# Patient Record
Sex: Female | Born: 1960
Health system: Southern US, Community
[De-identification: ages and names within clinical notes are randomized; demographics above are authoritative.]

## PROBLEM LIST (undated history)

## (undated) DIAGNOSIS — I10 Essential (primary) hypertension: Secondary | ICD-10-CM

## (undated) DIAGNOSIS — R519 Headache, unspecified: Secondary | ICD-10-CM

## (undated) DIAGNOSIS — F419 Anxiety disorder, unspecified: Secondary | ICD-10-CM

## (undated) DIAGNOSIS — E119 Type 2 diabetes mellitus without complications: Secondary | ICD-10-CM

## (undated) DIAGNOSIS — C9200 Acute myeloblastic leukemia, not having achieved remission: Secondary | ICD-10-CM

## (undated) DIAGNOSIS — D649 Anemia, unspecified: Secondary | ICD-10-CM

## (undated) DIAGNOSIS — M797 Fibromyalgia: Secondary | ICD-10-CM

## (undated) DIAGNOSIS — K219 Gastro-esophageal reflux disease without esophagitis: Secondary | ICD-10-CM

## (undated) DIAGNOSIS — Z86718 Personal history of other venous thrombosis and embolism: Secondary | ICD-10-CM

## (undated) DIAGNOSIS — F32A Depression, unspecified: Secondary | ICD-10-CM

## (undated) DIAGNOSIS — J189 Pneumonia, unspecified organism: Secondary | ICD-10-CM

## (undated) DIAGNOSIS — Z87442 Personal history of urinary calculi: Secondary | ICD-10-CM

## (undated) HISTORY — PX: OTHER SURGICAL HISTORY: SHX169

## (undated) HISTORY — PX: CHOLECYSTECTOMY: SHX55

## (undated) HISTORY — PX: ABDOMINAL HYSTERECTOMY: SHX81

## (undated) HISTORY — PX: LITHOTRIPSY: SUR834

---

## 1999-12-01 ENCOUNTER — Ambulatory Visit (HOSPITAL_COMMUNITY): Admission: RE | Admit: 1999-12-01 | Discharge: 1999-12-01 | Payer: Self-pay | Admitting: Obstetrics and Gynecology

## 1999-12-01 ENCOUNTER — Encounter: Payer: Self-pay | Admitting: Obstetrics and Gynecology

## 2000-01-04 ENCOUNTER — Other Ambulatory Visit: Admission: RE | Admit: 2000-01-04 | Discharge: 2000-01-04 | Payer: Self-pay | Admitting: Obstetrics and Gynecology

## 2000-03-21 ENCOUNTER — Ambulatory Visit (HOSPITAL_COMMUNITY): Admission: RE | Admit: 2000-03-21 | Discharge: 2000-03-21 | Payer: Self-pay | Admitting: Obstetrics and Gynecology

## 2000-04-14 ENCOUNTER — Inpatient Hospital Stay (HOSPITAL_COMMUNITY): Admission: AD | Admit: 2000-04-14 | Discharge: 2000-04-14 | Payer: Self-pay | Admitting: Obstetrics and Gynecology

## 2000-04-25 ENCOUNTER — Encounter (INDEPENDENT_AMBULATORY_CARE_PROVIDER_SITE_OTHER): Payer: Self-pay

## 2000-04-25 ENCOUNTER — Inpatient Hospital Stay (HOSPITAL_COMMUNITY): Admission: AD | Admit: 2000-04-25 | Discharge: 2000-04-29 | Payer: Self-pay | Admitting: Obstetrics and Gynecology

## 2001-05-28 ENCOUNTER — Encounter: Payer: Self-pay | Admitting: Emergency Medicine

## 2001-05-28 ENCOUNTER — Emergency Department (HOSPITAL_COMMUNITY): Admission: EM | Admit: 2001-05-28 | Discharge: 2001-05-29 | Payer: Self-pay | Admitting: Emergency Medicine

## 2001-05-31 ENCOUNTER — Observation Stay (HOSPITAL_COMMUNITY): Admission: RE | Admit: 2001-05-31 | Discharge: 2001-06-01 | Payer: Self-pay | Admitting: Obstetrics and Gynecology

## 2001-05-31 ENCOUNTER — Encounter (INDEPENDENT_AMBULATORY_CARE_PROVIDER_SITE_OTHER): Payer: Self-pay | Admitting: Specialist

## 2001-07-11 ENCOUNTER — Other Ambulatory Visit: Admission: RE | Admit: 2001-07-11 | Discharge: 2001-07-11 | Payer: Self-pay | Admitting: Obstetrics and Gynecology

## 2003-03-10 ENCOUNTER — Emergency Department (HOSPITAL_COMMUNITY): Admission: EM | Admit: 2003-03-10 | Discharge: 2003-03-11 | Payer: Self-pay | Admitting: Emergency Medicine

## 2003-03-11 ENCOUNTER — Encounter: Payer: Self-pay | Admitting: Emergency Medicine

## 2003-07-22 ENCOUNTER — Emergency Department (HOSPITAL_COMMUNITY): Admission: EM | Admit: 2003-07-22 | Discharge: 2003-07-22 | Payer: Self-pay | Admitting: Emergency Medicine

## 2003-07-25 ENCOUNTER — Ambulatory Visit (HOSPITAL_COMMUNITY): Admission: RE | Admit: 2003-07-25 | Discharge: 2003-07-25 | Payer: Self-pay | Admitting: Obstetrics and Gynecology

## 2003-07-25 ENCOUNTER — Encounter: Payer: Self-pay | Admitting: Obstetrics and Gynecology

## 2003-08-21 ENCOUNTER — Encounter (INDEPENDENT_AMBULATORY_CARE_PROVIDER_SITE_OTHER): Payer: Self-pay | Admitting: Specialist

## 2003-08-21 ENCOUNTER — Inpatient Hospital Stay (HOSPITAL_COMMUNITY): Admission: RE | Admit: 2003-08-21 | Discharge: 2003-08-23 | Payer: Self-pay | Admitting: Obstetrics and Gynecology

## 2005-11-09 ENCOUNTER — Ambulatory Visit: Payer: Self-pay | Admitting: Family Medicine

## 2006-01-25 ENCOUNTER — Emergency Department (HOSPITAL_COMMUNITY): Admission: EM | Admit: 2006-01-25 | Discharge: 2006-01-25 | Payer: Self-pay | Admitting: Emergency Medicine

## 2009-03-24 ENCOUNTER — Emergency Department (HOSPITAL_COMMUNITY): Admission: EM | Admit: 2009-03-24 | Discharge: 2009-03-24 | Payer: Self-pay | Admitting: Emergency Medicine

## 2009-08-15 ENCOUNTER — Emergency Department (HOSPITAL_COMMUNITY): Admission: EM | Admit: 2009-08-15 | Discharge: 2009-08-16 | Payer: Self-pay | Admitting: Emergency Medicine

## 2009-12-26 ENCOUNTER — Emergency Department (HOSPITAL_COMMUNITY): Admission: EM | Admit: 2009-12-26 | Discharge: 2009-12-26 | Payer: Self-pay | Admitting: Emergency Medicine

## 2010-01-19 ENCOUNTER — Emergency Department (HOSPITAL_COMMUNITY): Admission: EM | Admit: 2010-01-19 | Discharge: 2010-01-19 | Payer: Self-pay | Admitting: Emergency Medicine

## 2010-09-09 ENCOUNTER — Emergency Department (HOSPITAL_COMMUNITY): Admission: EM | Admit: 2010-09-09 | Discharge: 2009-12-28 | Payer: Self-pay | Admitting: Emergency Medicine

## 2010-09-30 ENCOUNTER — Emergency Department (HOSPITAL_COMMUNITY)
Admission: EM | Admit: 2010-09-30 | Discharge: 2010-09-30 | Payer: Self-pay | Source: Home / Self Care | Admitting: Emergency Medicine

## 2011-01-05 LAB — POCT I-STAT, CHEM 8
Calcium, Ion: 1.03 mmol/L — ABNORMAL LOW (ref 1.12–1.32)
HCT: 44 % (ref 36.0–46.0)
Hemoglobin: 15 g/dL (ref 12.0–15.0)
TCO2: 23 mmol/L (ref 0–100)

## 2011-01-05 LAB — POCT CARDIAC MARKERS

## 2011-01-10 LAB — URINE MICROSCOPIC-ADD ON

## 2011-01-10 LAB — URINALYSIS, ROUTINE W REFLEX MICROSCOPIC
Glucose, UA: NEGATIVE mg/dL
Ketones, ur: NEGATIVE mg/dL
Leukocytes, UA: NEGATIVE
pH: 8 (ref 5.0–8.0)

## 2011-02-18 NOTE — Discharge Summary (Signed)
NAMEMarland Kitchen  Jessica Vega, Jessica Vega                       ACCOUNT NO.:  [id number]   MEDICAL RECORD NO.:  [id number]                   PATIENT TYPE:  INP   LOCATION:  0467                                 FACILITY:  [facility]   PHYSICIAN:  Malachi Pro. Ambrose Mantle, M.D.              DATE OF BIRTH:  July 12, 1961   DATE OF ADMISSION:  08/21/2003  DATE OF DISCHARGE:  08/23/2003                                 DISCHARGE SUMMARY   This is a 50 year old black female with menorrhagia, dysmenorrhea, fibroids,  pelvic pain.  Admitted for hysterectomy.  The patient underwent abdominal  hysterectomy by Dr. Ambrose Mantle with Dr. Jackelyn Knife assisting under general  anesthesia on August 21, 2003, with blood loss about 200 mL.  Postoperatively on the night of surgery the patient had a fever of 102.2  degrees.  There was no obvious source.  It was assumed to be related to her  respiratory tract since she was a heavy smoker.  With continued incentive  spirometry, encouraging her to deep breath and cough, her temperature went  away and never recurred.  She did pass flatus.  Her abdomen remained soft  and nontender.  She tolerated a regular diet, and on the second  postoperative day was ready for discharge.  The pathology report is not  available at the time of this dictation.  Her white count was 7200,  hemoglobin 11, hematocrit 32.6, platelet count 392,000.  Follow-up  hematocrits 32.1 and 30.5.  Differential was normal.  Comprehensive  metabolic profile was normal except for a glucose of 117.  Urinalysis was  negative.   FINAL DIAGNOSES:  1. Leiomyomata uteri.  2. Menorrhagia.  3. Dysmenorrhea.  4. Pelvic pain.   OPERATION:  Abdominal hysterectomy.   FINAL CONDITION:  Improved.   INSTRUCTIONS:  Regular diet.  No vaginal entrance.  No heavy lifting or  strenuous activity.  Call with any fever above 100.4 degrees.  Call with any  unusual problems.  The patient is advised to return to the office in two to  three days to have  her staples removed.  Percocet 5/325, 20 tablets, 1 or 2  every three to four hours as needed for pain is given at discharge.                                               Malachi Pro. Ambrose Mantle, M.D.    TFH/MEDQ  D:  08/23/2003  T:  08/23/2003  Job:  [id number]

## 2011-02-18 NOTE — Discharge Summary (Signed)
[facility]  Patient:    Jessica Vega, SCHWALB                    MRN: [id number] Adm. Date:  [id number] Disc. Date: [id number] Attending:  Malon Kindle                           Discharge Summary  HISTORY OF PRESENT ILLNESS:   This is a 50 year old black female para 1 1 1  2, gravida 4, blood type B+, negative antibody, sickle cell negative, RPR nonreactive, rubella immune, hepatitis B surface antigen negative, HIV negative, GC and chlamydia negative; 3-hour GTT 95, 163, 164, and 155. Admitted for repeat C-section and tubal ligation after declining vaginal birth after cesarean.  The patient underwent a low transverse cervical cesarean section and bilateral tubal ligation by Dr. Ambrose Mantle with Dr. Senaida Ores assisting under spinal anesthesia.  Delivery was of a female infant 6 pounds 8 ounces, Apgars 8 at 1 and 9 at 5 minutes.  Blood loss was about 1000 cc. There was dense abdominal wall scarring, probably as much as I have ever seen, although the intraperitoneal tissues were well-preserved.  There was a huge fibroid, at least 10 cm, occupying the anterior mid-portion of the uterus.  It was completely intramural and was not removed.  Tubal ligation was done.  The tubes appeared normal.  On the evening of surgery, the patient was noted to have a dressing saturated with serosanguineous fluid over the incision.  The dressing was removed.  A small amount of serous fluid was expressed from the incision.  A clean dressing was applied.  The staples were slightly askew in the middle of the incision.  Subsequently, the patient had very minimal drainage from the incision.  She had good bowel and urinary function.  She ambulated well without difficulty.  On the third postop day, her staples were removed and, except in the mid-portion where the skin edges were not perfectly opposed, the incision looked good.  Hemoglobin on admission was 11.6, hematocrit 34.0,  white count 14,600, platelet count 308,000.  Followup hemoglobin 11.5, hematocrit 33.4, white count 14,300, platelet count 293,000. The pathology report is not available.  FINAL DIAGNOSIS:              Intrauterine pregnancy at 39 weeks delivered by C-section.  Declined vaginal birth after cesarean section.  Large uterine fibroid.  Voluntary sterilization.  OPERATION:                    Low transverse cervical cesarean section and bilateral tubal ligation.  FINAL CONDITION:              Improved.  DISCHARGE INSTRUCTIONS:       Our regular discharge instruction booklet.  The patient is advised to return in 10-14 days for followup examination. Postoperative exams will include attention to the fibroid to see if it diminishes significantly in size.  DISCHARGE MEDICATIONS:        Percocet 5/325 #16 1 every four to six hours as needed for pain.  FOLLOW-UP:                    The patient is to follow our discharge instruction booklet, report any temperature greater than 100 degrees, report any heavy vaginal bleeding, and return to the office in 10-14 days. DD:  04/28/00 TD:  05/01/00 Job: [id number]

## 2011-02-18 NOTE — Op Note (Signed)
[facility]  Patient:    Jessica Vega, Jessica Vega                    MRN: [id number] Proc. Date: 04/25/00 Adm. Date:  [id number] Attending:  Malon Kindle                           Operative Report  PREOPERATIVE DIAGNOSIS:           1. Intrauterine pregnancy at 39 weeks.                                   2. History of cesarean section.  Declined                                      vaginal birth after cesarean.                                   3. Large intramural anterior fibroid.  POSTOPERATIVE DIAGNOSIS:          1. Intrauterine pregnancy at 39 weeks.                                   2. History of cesarean section.  Declined                                      vaginal birth after cesarean.                                   3. Extremely dense adhesions and scar tissue                                      in the abdominal wall.                                   4. Large intramural anterior fibroid.  OPERATION:                        Low transverse cervical cesarean section.   SURGEON:                          Malachi Pro. Ambrose Mantle, M.D.  ASSISTANT:                        Alvino Chapel, M.D.  ANESTHESIA:                       Spinal.  DESCRIPTION OF PROCEDURE:         The patient was given a spinal anesthetic by Dr. Jean Rosenthal.  She was placed in the left lateral tilt position.  Fetal heart tones were normal.  The abdomen, vulva, vagina, and urethra were prepped with Betadine solution.  A catheter was inserted to drainage.  The abdomen was then draped as a sterile field.  A transverse incision was made and carried in layers through the skin and subcutaneous tissue and the old scar.  There was a dense amount of scar tissue.  We finally reached the fascia, or what we thought was the fascia, and incised it, but then apparently we were even above the fascia and continued to dissect until we did reach the fascia.   We separated it through dense,  dense scar tissue from the rectus muscle superiorly and inferiorly.  We opened the peritoneum in the midline, got as much visibility as we could.  We incised this peritoneum over the lower uterine segment, pushed it inferiorly, and then made an incision into the lower uterine segment.  The infants head was difficult to deliver through the incision.  When I put the vacuum on, it popped off; so I did incise the left rectus muscle, and the baby came out easily.  The cord was clamped.  The infant was given to the pediatricians.  It was a female infant, 6 pounds 8 ounces, Apgars of 8 at one and 9 at 5 minutes.  The placenta was then removed.  The cervix was dilated.  The inside of the uterus was inspected and found to be free of any products of conception.  There was at least a 10 cm fibroid intramural in the anterior uterine surface.  This was left alone.  The uterine incision was closed with a running suture of 0 Vicryl, and several other sutures were required for complete hemostasis.  Only one layer was used. After thoroughly searching for bleeding sites, repositioned the uterus in the abdominal of the cavity, identified both tubes and ovaries which were normal, made a window in each mesosalpinx with two ties of 0 plain catgut proximally and distally on each tube and then excised the segment of tube intervening. No bleeding was present.  I then reinspected the uterus, had a couple of extra bleeding sites that I sutured, and reperitonealized over the lower uterine segment.  I then closed the abdominal wall peritoneum as well as I could. I tried to resuture the left rectus muscle.  The first suture was broken, and then we were able to suture the second time.  We thoroughly searched for bleeders intra, sub, and extrafascially.  We then placed the fascia back together with two running sutures of 0 Vicryl and thoroughly searched for bleeders in the subcutaneous tissue.  After obtaining complete  hemostasis, used a 3-0 Vicryl to close the subcutaneous tissue, and staples were used for the skin.  The patient seemed to tolerate the procedure well.  She was returned to recovery in satisfactory condition.  Blood loss about 1000 cc. DD:  04/25/00 TD:  04/27/00 Job: [id number]

## 2011-02-18 NOTE — Op Note (Signed)
NAMEMarland Kitchen  Jessica Vega, Jessica Vega                       ACCOUNT NO.:  [id number]   MEDICAL RECORD NO.:  [id number]                   PATIENT TYPE:  INP   LOCATION:  0467                                 FACILITY:  [facility]   PHYSICIAN:  Malachi Pro. Ambrose Mantle, M.D.              DATE OF BIRTH:  1961/01/09   DATE OF PROCEDURE:  08/21/2003  DATE OF DISCHARGE:                                 OPERATIVE REPORT   PREOPERATIVE DIAGNOSES:  Leiomyomata uteri, menorrhagia, dysmenorrhea,  pelvic pain.   POSTOPERATIVE DIAGNOSES:  Leiomyomata uteri, menorrhagia, dysmenorrhea,  pelvic pain.   OPERATION:  Abdominal hysterectomy.   SURGEON:  Malachi Pro. Ambrose Mantle, M.D.   ASSISTANT:  Zenaida Niece, M.D.   ANESTHESIA:  General.   DESCRIPTION OF PROCEDURE:  The patient was brought to the operating room,  placed under satisfactory general anesthesia, and placed supine on the table  in the frog leg position. The abdomen, vulva, vagina and urethra were  prepped with Betadine solution, Foley catheter was inserted to straight  drainage. The patient was then placed supine, the abdomen was draped as a  sterile field and a transverse incision was made in the lower abdomen above  the pubis. The patient had a very short distance from her pubis to her  umbilicus. She had had two previous cesarean sections and I noted at the  time of the second cesarean section that she had a lot of scar tissue in her  abdominal wall. I again encountered a significant scar tissue, separated the  subcutaneous tissue down to the fascia, and then incised the fascia  transversely. I divided the rectus muscles in the midline, opened the  peritoneal cavity vertically, explored the upper abdomen and felt numerous  adhesions under her old long gallbladder scar. Both kidneys felt normal.  Exploration of the pelvis revealed both tubes to have been ligated. Both  ovaries looked normal. The posterior cul-de-sac was normal, the uterus had a  large fibroid  probably 6 cm. The anterior cul-de-sac was normal. Packs and  retractors were used for exposure, the upper pedicles were clamped across.  The patient had a right tubal pregnancy, I included the proximal portion of  the tube with the uterus. Divided both round ligaments, developed a bladder  flap, divided the upper pedicles between clamps and doubly suture ligated  them. I skeletonized the uterine vessels, divided them bilaterally and  sutured the doubly suture ligated the clamps both in the upper pedicles and  the uterine vessels. Clamped, cut and suture ligated the parametrial and  paracervical tissues, held the uterosacral ligaments and then entered the  left side of the vagina and removed the uterus by transecting the upper  vagina. Vaginal angled sutures were placed and then the central portion of  the vagina was closed with interrupted figure-of-eight sutures of #0 Vicryl.  Hemostasis was achieved with cautery, the uterosacral ligaments were sutured  together in the midline. The  bladder was filled with methylene blue stained  fluid, no leakage was noted, no sutures were closed to the bladder.  Reperitonealization was done across the vaginal cuff with interrupted figure-  of-eight sutures of #0 Vicryl. Hemostasis was adequate, packs and retractors  were removed and the peritoneum was closed with a running suture of #0  Vicryl after the ureters were palpated and found to be normal in course and  contour and size. After the peritoneum was closed, the rectus muscles  appeared to be fairly well approximated, the fascia was closed with two  running sutures of #0 Vicryl, the subcu with a running 3-0 Vicryl and the  skin was closed with automatic staples. The patient seemed to tolerate the  procedure well, blood loss was about 200 mL. Sponge and needle counts were  correct and she was returned to recovery in satisfactory condition.                                               Malachi Pro.  Ambrose Mantle, M.D.    TFH/MEDQ  D:  08/21/2003  T:  08/21/2003  Job:  [id number]

## 2011-02-18 NOTE — Discharge Summary (Signed)
[facility]  Patient:    Jessica Vega, Jessica Vega                    MRN: [id number] Adm. Date:  [id number] Disc. Date: [id number] Attending:  Malon Kindle                           Discharge Summary  ADDENDUM:  HOSPITAL COURSE:              On the morning of April 28, 2000, after Dr. Ambrose Mantle had written for discharge, the patients blood pressures rose to 140s-160s/80s-90s.  She had no symptoms with this.  Due to this, she had [facility] laboratories drawn which returned within normal limits.  Her incision also had a significant amount of yellowish drainage, and her Steri-Strips were coming undone.  The Steri-Strips were removed and replaced, and there was noted to be a significant overlap of the inferior portion of the incision for approximately 3 cm in the middle of the incision.  She was monitored overnight, and her blood pressures were mostly 150s-160s/80s-90s, but on the morning of April 29, 2000, it had decreased to 130/88.  She still had no symptoms and, as her [facility] laboratories were normal, she was felt to be stable enough for discharge home from this standpoint.  Her incision was healing and, again, the Steri-Strips were loose just due to the patients body habitus.  At this point, she will be discharged home as per her previous discharge instructions by Dr. Ambrose Mantle. DD:  04/29/00 TD:  05/01/00 Job: [id number]

## 2011-02-18 NOTE — H&P (Signed)
NAME:  Jessica Vega, Jessica Vega                       ACCOUNT NO.:  [id number]   MEDICAL RECORD NO.:  [id number]                   PATIENT TYPE:  INP   LOCATION:  NA                                   FACILITY:  [facility]   PHYSICIAN:  Malachi Pro. Ambrose Mantle, M.D.              DATE OF BIRTH:  05-06-1961   DATE OF ADMISSION:  08/21/2003  DATE OF DISCHARGE:                                HISTORY & PHYSICAL   HISTORY OF PRESENT ILLNESS:  This is a 50 year old black female, para 2-1-2-  3, admitted to the hospital for abdominal hysterectomy, possible bilateral  salpingo-oophorectomy because of leiomyomata uteri, menorrhagia,  dysmenorrhea, and pelvic pain.  Last menstrual period August 11, 2003.  The  patient's periods occur at approximately 28 day intervals, they usually last  five to nine days, occasionally up to 11 days.  She claims to have heavy  flow, using 14 pads in two days, and claims to have severe dysmenorrhea.  She also complains that she has an urge to defecate, but then cannot have a  BM.  She has lower abdominal, low back, and leg pain 15-20 days per month.  She has nausea, but no vomiting.   When I examined her on July 24, 2003, the patient had slight tenderness  in the lower abdomen, uterus was enlarged approximately 10 weeks size and  tender to motion.  She underwent pelvic ultrasound which revealed a  calcified fibroid in the mid posterior uterus 4.5 cm in greatest diameter.  Because of her heavy bleeding, dysmenorrhea, and fibroid, she was advised to  consider hysterectomy, but advised to also realize that no guarantee could  be given as to the relief of the other lower abdominal and pelvic, back, and  leg pain that she experiences.   ALLERGIES:  No known allergies.   PAST MEDICAL HISTORY:  She has had no known major adult illnesses.  She has  no known heart problems.   SOCIAL HISTORY:  She does not drink alcohol.  She smokes greater than one  pack a day.   PAST SURGICAL  HISTORY:  1. Two cesarean sections.  2. Tubal ligation with the second.  3. Removal of tubal pregnancy from the right tube.  4. Cholecystectomy.  5. Lithotripsy.  6. Ganglion cyst.   FAMILY HISTORY:  Mom is 79, has had a stroke, high blood pressure, and acid  reflux.  Father, 22, mental illness and diabetes.  One sister living and  well.  Two brothers, one with liver problems, and the other with some other  medical condition.   REVIEW OF SYSTEMS:  Noncontributory.  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  She attended [employer] three years, but did not receive  a degree.  She was in the [employer] for three years.  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   PHYSICAL  EXAMINATION:  GENERAL:  A well-developed, slightly obese, black  female.  VITAL SIGNS:  Weight is 161.5 pounds, blood pressure 130/75, pulse of 80.  HEENT:  Reveal no cranial abnormalities.  Extraocular movements were intact.  Nose and pharynx are clear.  NECK:  Supple without thyromegaly.  HEART:  Normal size and sounds, no murmurs.  LUNGS:  Clear to P&A.  BREASTS:  Soft without masses or asymmetry.  ABDOMEN:  Soft and nontender.  There are no masses palpable.  PELVIC:  Pap smear on July 24, 2003, was within normal limits.  The vulva  and vagina are clean.  The cervix is clean.  Uterus is retroverted, thought  to be about 12 weeks size.  The adnexae are free of masses.  RECTAL:  Done on July 24, 2003, was negative.   ADMITTING IMPRESSION:  1. Leiomyomata uteri.  2. Menorrhagia.  3. Dysmenorrhea.  4. Pelvic pain.  5. Possible irritable bowel syndrome.   PLAN:  The patient is admitted for abdominal hysterectomy, possible  bilateral salpingo-oophorectomy.  She has been informed that the abdominal  incision will be above the two previous incisions because they are almost  over the pubis and the second was done, and when it was done we encountered   a lot of scar tissue.  She has also been informed that the surgical  procedure is accompanied by certain risks, including heart attack, stroke,  thromboembolic phenomenon, intestinal obstruction, wound disruption,  hemorrhage with need for re-operation and/or transfusion, fistula formation,  nerve injury.  She understands and agrees to proceed.  She has also been  informed that the surgery may have some unanticipated effect on her sexual  function.                                               Malachi Pro. Ambrose Mantle, M.D.    TFH/MEDQ  D:  08/20/2003  T:  08/20/2003  Job:  [id number]
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
## 2011-02-18 NOTE — Discharge Summary (Signed)
[facility]  Patient:    Jessica Vega, Jessica Vega Visit Number: [id number] MRN: [id number]          Service Type: SUR Location: 4W 0457 [id number] Attending Physician:  Malon Kindle Admit Date:  05/31/2001 Discharge Date: 06/01/2001                             Discharge Summary  This patient was admitted for observation after outpatient surgery.  She was kept in the hospital about 18 hours.  HISTORY:  This is a 50 year old black female, para 2-1-1-3, gravida 5, with last period April 18, 2001, blood group and type B-positive, who was admitted with a known large fibroid and a presumed right tubal pregnancy.  The patient came to [facility] Emergency Room this week with abdominal pain.  A beta hCG was 1700, no intrauterine pregnancy was seen on ultrasound.  The beta hCG was repeated two days later and was 2111.  Again, no intrauterine was seen on ultrasound.  No extrauterine was seen either.  The patient continued to have some abdominal pain, but not excruciating.  She was prepared for surgery, but the uncertainty of the diagnosis precluded absolutely definitive therapy. The patient did not want the pregnancy, but she is applying for medicaid and medicaid does not cover abortions.  Therefore, the plan was to do an open laparoscopy because of her prior abdominal incisions, remove the tubal pregnancy if it was present, but if there was an intrauterine pregnancy to leave it alone.  The patient underwent the diagnostic laparoscopy with findings of a right tubal pregnancy, although the large fibroid continually got in the way of good visualization.  In fact, I could never see the left tube.  I did do a partial right saplingectomy and removed the tubal pregnancy. Blood loss was thought to be about 15 cc.  Postoperatively, the patient did quite well.  Initial hemoglobin was 11.4, hematocrit 34.3.  Follow-up hemoglobin was 10+ with a hematocrit of 31+.  White  count 9,000.  Her abdomen was soft and nontender on the day after surgery.  She was ambulating well without difficulty, wanting a regular diet, and was considered to be ready for discharge.  FINAL DIAGNOSES: 1. Larger uterine fibroid. 2. Right tubal pregnancy.  OPERATION:  Diagnostic open laparoscopy, right partial salpingectomy, and removal of right tubal pregnancy.  FINAL CONDITION:  Improved.  INSTRUCTIONS:  No vaginal entrance, no heavy lifting or strenuous activity, call with any fever above 100.4 degrees, call when any unusual problems. Percocet 5/325, 16 tablets, 1 q.4-6h. is given at discharge.  She is to return in four days to have her staples removed. Attending Physician:  Malon Kindle DD:  06/01/01 TD:  06/01/01 Job: [phone]

## 2011-02-18 NOTE — Op Note (Signed)
[facility]  Patient:    Jessica Vega, Jessica Vega Visit Number: [id number] MRN: [id number]          Service Type: SUR Location: 4W 0457 01 Attending Physician:  Malon Kindle Proc. Date: 05/31/01 Admit Date:  05/31/2001                             Operative Report  PREOPERATIVE DIAGNOSIS:  Right tubal pregnancy, enlarged fibroid.  POSTOPERATIVE DIAGNOSIS:  Right tubal pregnancy, enlarged fibroid.  OPERATION:  Open laparoscopy with right partial salpingectomy and removal of right tubal pregnancy.  SURGEON:  Malachi Pro. Ambrose Mantle, M.D.  ANESTHESIA:  General.  DESCRIPTION OF PROCEDURE:  The patient was brought to the operating room and placed under satisfactory general anesthesia.  The abdomen, vulva, vagina, and urethra were prepped with Betadine solution, and draped as a sterile field.  A Hulka cannula was placed into the uterus and attached to the anterior cervical lip.  The bladder was emptied with a [place] catheter.  An incision was made into the abdominal wall above the umbilicus and carried down to the fascia. The fascia was drawn up and incised, and the peritoneum was entered.  I inspected the bowel that was close by and found no evidence of injury.  I was able to see that there was some hemoperitoneum close to the right tube, but the pelvis was difficult to visualize because of a very large anterior fibroid.  A smaller trocar was inserted through the lower abdomen midline under direct vision, and then I proceeded to try to cauterize the distal right tube.  The pregnancy seemed to be in the fimbriated end of the tube and actually during the manipulation came out of the fimbriated end of the tube.  After many cauterizations, the tube was cut across, and the tubal pregnancy, and the end of the tube were free in the abdomen.  I then made a 10 mm port in the lower abdomen after unsuccessfully trying to remove the tube, and the pregnancy through  the single puncture umbilical site.  I made a 10 mm puncture in the lower midline and then was easily able to grasp both the products of conception and the tube, and remove them.  I had intended to cauterize the left tube, but this was impossible because I never could even visualize the left tube or ovary because of the large fibroid.  Also, the right ovary was normal.  I liberally irrigated the entire operative field with the [place] suction irrigator.  There was no significant bleeding.  The umbilical incision was then closed by tying the pursestring suture of 0 Vicryl.  I could not close the fascia on the lower midline incision, so that incision, I just closed the subcutaneous tissue.  I stapled the skin.  The patient seemed to tolerate the procedure well and was returned to recovery in satisfactory condition. Attending Physician:  Malon Kindle DD:  05/31/01 TD:  05/31/01 Job: [id number]

## 2011-05-06 ENCOUNTER — Emergency Department (HOSPITAL_COMMUNITY): Payer: No Typology Code available for payment source

## 2011-05-06 ENCOUNTER — Emergency Department (HOSPITAL_COMMUNITY)
Admission: EM | Admit: 2011-05-06 | Discharge: 2011-05-06 | Disposition: A | Discharge status: Home / Self Care | Payer: No Typology Code available for payment source | Attending: Emergency Medicine | Admitting: Emergency Medicine

## 2011-05-06 DIAGNOSIS — S5000XA Contusion of unspecified elbow, initial encounter: Secondary | ICD-10-CM | POA: Insufficient documentation

## 2011-05-06 DIAGNOSIS — Y9229 Other specified public building as the place of occurrence of the external cause: Secondary | ICD-10-CM | POA: Insufficient documentation

## 2011-05-06 DIAGNOSIS — M25519 Pain in unspecified shoulder: Secondary | ICD-10-CM | POA: Insufficient documentation

## 2011-05-06 DIAGNOSIS — S40019A Contusion of unspecified shoulder, initial encounter: Secondary | ICD-10-CM | POA: Insufficient documentation

## 2011-05-06 DIAGNOSIS — W010XXA Fall on same level from slipping, tripping and stumbling without subsequent striking against object, initial encounter: Secondary | ICD-10-CM | POA: Insufficient documentation

## 2011-05-06 DIAGNOSIS — I10 Essential (primary) hypertension: Secondary | ICD-10-CM | POA: Insufficient documentation

## 2011-05-06 DIAGNOSIS — M25529 Pain in unspecified elbow: Secondary | ICD-10-CM | POA: Insufficient documentation

## 2012-08-26 ENCOUNTER — Encounter (HOSPITAL_COMMUNITY): Payer: Self-pay | Admitting: Emergency Medicine

## 2012-08-26 ENCOUNTER — Emergency Department (HOSPITAL_COMMUNITY)
Admission: EM | Admit: 2012-08-26 | Discharge: 2012-08-26 | Disposition: A | Discharge status: Home / Self Care | Payer: Self-pay | Attending: Emergency Medicine | Admitting: Emergency Medicine

## 2012-08-26 DIAGNOSIS — Z79899 Other long term (current) drug therapy: Secondary | ICD-10-CM | POA: Insufficient documentation

## 2012-08-26 DIAGNOSIS — W010XXA Fall on same level from slipping, tripping and stumbling without subsequent striking against object, initial encounter: Secondary | ICD-10-CM | POA: Insufficient documentation

## 2012-08-26 DIAGNOSIS — S40029A Contusion of unspecified upper arm, initial encounter: Secondary | ICD-10-CM | POA: Insufficient documentation

## 2012-08-26 DIAGNOSIS — Y9389 Activity, other specified: Secondary | ICD-10-CM | POA: Insufficient documentation

## 2012-08-26 DIAGNOSIS — I10 Essential (primary) hypertension: Secondary | ICD-10-CM | POA: Insufficient documentation

## 2012-08-26 DIAGNOSIS — F17200 Nicotine dependence, unspecified, uncomplicated: Secondary | ICD-10-CM | POA: Insufficient documentation

## 2012-08-26 DIAGNOSIS — Y929 Unspecified place or not applicable: Secondary | ICD-10-CM | POA: Insufficient documentation

## 2012-08-26 DIAGNOSIS — Z7982 Long term (current) use of aspirin: Secondary | ICD-10-CM | POA: Insufficient documentation

## 2012-08-26 HISTORY — DX: Essential (primary) hypertension: I10

## 2012-08-26 NOTE — ED Notes (Signed)
Pt states fell backwards in chair last p.m. Landing on elbow and has had right arm pain since fall, unable to sleep during the night. Pain primarily upper arm into shoulder pain, Rx'ed w/ ASA w/o any relief

## 2012-08-26 NOTE — ED Notes (Signed)
Pt is upset with the ED MD stating, "he don't know what he doing because I didn't even get a xray, and he mashed on my arm real hard." Explained to pt that palpation is part of his exam and the purpose was to feel if she had any obvious break or deformity that would prompt and xray examination as well. Advised pt that from the notes, it appears that he felt nothing was broken and has diagnosed with contusion. Pt was thankful for explanation, but still thinks she needs an xray. She told her daughter to get ready. Advised pt that daughter is not discharged yet and she will need to wait. Pt states she just wants her daughter's results and wants to leave. Diffused the situation, and pt willing to allow her daughter to stay until resolution of case. However, she states she wants a new doctor, also she states she plans to "get a ride to [place] today to go to the [place]." ED MD and primary nurse notified.

## 2012-08-26 NOTE — ED Notes (Signed)
Reports fell out of chair last night around 8:30. Fell with right arm stretched out backwards now c/o right elbow & right upper arm.

## 2012-08-26 NOTE — ED Provider Notes (Signed)
History     CSN: [id number]  Arrival date & time 08/26/12  0865   First MD Initiated Contact with Patient 08/26/12 0818      Chief Complaint  Patient presents with  . Arm Pain    (Consider location/radiation/quality/duration/timing/severity/associated sxs/prior treatment) HPI Comments: Ms. Nissley states she tripped and fell last night.  She tried to catch herself from falling by stretching out the left arm and grabbing a piece of furniture but instead she struck the inner aspect of the arm against a hard, solid object.  She denies striking her head, concerns for domestic abuse or interpersonal violence, and any other injuries.  She has been able to use the arm since the fall but states she hs residual pain/tenderness.  Patient is a 51 y.o. female presenting with arm pain. The history is provided by the patient. No language interpreter was used.  Arm Pain This is a new problem. The current episode started 6 to 12 hours ago. The problem has not changed since onset.Pertinent negatives include no chest pain, no abdominal pain, no headaches and no shortness of breath. She has tried ASA for the symptoms. The treatment provided no relief.    Past Medical History  Diagnosis Date  . Hypertension     Past Surgical History  Procedure Date  . Abdominal hysterectomy   . Cholecystectomy     No family history on file.  History  Substance Use Topics  . Smoking status: Current Every Day Smoker -- 1.0 packs/day    Types: Cigarettes  . Smokeless tobacco: Never Used  . Alcohol Use: 0.6 oz/week    1 Glasses of wine per week     Comment: rarely    OB History    Grav Para Term Preterm Abortions TAB SAB Ect Mult Living                  Review of Systems  Respiratory: Negative for shortness of breath.   Cardiovascular: Negative for chest pain.  Gastrointestinal: Negative for abdominal pain.  Neurological: Negative for headaches.  All other systems reviewed and are  negative.    Allergies  Review of patient's allergies indicates no known allergies.  Home Medications   Current Outpatient Rx  Name  Route  Sig  Dispense  Refill  . ASPIRIN 325 MG PO TABS   Oral   Take 650 mg by mouth once.         Marland Kitchen BENAZEPRIL-HYDROCHLOROTHIAZIDE 20-25 MG PO TABS   Oral   Take 1 tablet by mouth daily.         Marland Kitchen VITAMIN D 1000 UNITS PO TABS   Oral   Take 1,000 Units by mouth daily.         . OMEGA-3 FATTY ACIDS 1000 MG PO CAPS   Oral   Take 1 g by mouth daily.           BP 105/67  Pulse 65  Temp 98.3 F (36.8 C) (Oral)  Resp 16  SpO2 93%  Physical Exam  Nursing note and vitals reviewed. Constitutional: She is oriented to person, place, and time. She appears well-developed and well-nourished. No distress.  HENT:  Head: Normocephalic and atraumatic.  Right Ear: External ear normal.  Left Ear: External ear normal.  Nose: Nose normal.  Mouth/Throat: Oropharynx is clear and moist. No oropharyngeal exudate.  Eyes: Conjunctivae normal are normal. Pupils are equal, round, and reactive to light. Right eye exhibits no discharge. Left eye exhibits no discharge. No  scleral icterus.  Neck: Normal range of motion. Neck supple. No tracheal deviation present.       No midline tenderness or step-offs   Cardiovascular: Normal rate, regular rhythm, normal heart sounds and intact distal pulses.  Exam reveals no gallop and no friction rub.   No murmur heard. Pulmonary/Chest: Effort normal. No stridor. No respiratory distress. She has no wheezes. She has no rales. She exhibits no tenderness.  Abdominal: Soft. Bowel sounds are normal. She exhibits no distension and no mass. There is no tenderness. There is no rebound and no guarding.  Musculoskeletal: Normal range of motion. She exhibits tenderness. She exhibits no edema.       Right upper arm: She exhibits tenderness. She exhibits no bony tenderness, no swelling, no edema, no deformity and no laceration.        Arms:      No deformities x 4 extremities.  No midline back tenderness.  Very mild tenderness with palpation of the left upper arm.  No appreciable asymmetry when compared to the right.    Neurological: She is alert and oriented to person, place, and time. No cranial nerve deficit.  Skin: Skin is warm and dry. No rash noted. She is not diaphoretic. No erythema. No pallor.  Psychiatric: She has a normal mood and affect. Her behavior is normal.    ED Course  Procedures (including critical care time)  Labs Reviewed - No data to display No results found.   No diagnosis found.    MDM  Pt presents for evaluation of left upper arm pain after falling last night.  She appears comfortable, NAD.  Note stable VS.  Pt has no deformities, tenseness to the soft tissues, and is able to tolerate palpation of the arm.  She also has intact ROM at the shoulder and elbow and no evidence of bony tenderness.  Plan OTC tylenol or motrin as needed for discomfort.  Will discharge home.        Tobin Chad, MD 08/26/12 [phone]

## 2012-08-27 ENCOUNTER — Encounter (HOSPITAL_COMMUNITY): Payer: Self-pay | Admitting: Emergency Medicine

## 2012-08-27 ENCOUNTER — Emergency Department (HOSPITAL_COMMUNITY)
Admission: EM | Admit: 2012-08-27 | Discharge: 2012-08-27 | Disposition: A | Discharge status: Home / Self Care | Payer: Non-veteran care | Attending: Emergency Medicine | Admitting: Emergency Medicine

## 2012-08-27 DIAGNOSIS — Z79899 Other long term (current) drug therapy: Secondary | ICD-10-CM | POA: Insufficient documentation

## 2012-08-27 DIAGNOSIS — L03119 Cellulitis of unspecified part of limb: Secondary | ICD-10-CM

## 2012-08-27 DIAGNOSIS — W19XXXS Unspecified fall, sequela: Secondary | ICD-10-CM | POA: Insufficient documentation

## 2012-08-27 DIAGNOSIS — R110 Nausea: Secondary | ICD-10-CM | POA: Insufficient documentation

## 2012-08-27 DIAGNOSIS — Z7982 Long term (current) use of aspirin: Secondary | ICD-10-CM | POA: Insufficient documentation

## 2012-08-27 DIAGNOSIS — I10 Essential (primary) hypertension: Secondary | ICD-10-CM | POA: Insufficient documentation

## 2012-08-27 DIAGNOSIS — IMO0001 Reserved for inherently not codable concepts without codable children: Secondary | ICD-10-CM | POA: Insufficient documentation

## 2012-08-27 DIAGNOSIS — F17200 Nicotine dependence, unspecified, uncomplicated: Secondary | ICD-10-CM | POA: Insufficient documentation

## 2012-08-27 DIAGNOSIS — IMO0002 Reserved for concepts with insufficient information to code with codable children: Secondary | ICD-10-CM | POA: Insufficient documentation

## 2012-08-27 DIAGNOSIS — R1013 Epigastric pain: Secondary | ICD-10-CM | POA: Insufficient documentation

## 2012-08-27 LAB — CBC WITH DIFFERENTIAL/PLATELET
Basophils Absolute: 0 K/uL (ref 0.0–0.1)
Basophils Relative: 0 % (ref 0–1)
Eosinophils Absolute: 0 10*3/uL (ref 0.0–0.7)
Eosinophils Relative: 1 % (ref 0–5)
HCT: 36.8 % (ref 36.0–46.0)
Hemoglobin: 13 g/dL (ref 12.0–15.0)
Lymphocytes Relative: 22 % (ref 12–46)
Lymphs Abs: 0.7 10*3/uL (ref 0.7–4.0)
MCH: 30.7 pg (ref 26.0–34.0)
MCHC: 35.3 g/dL (ref 30.0–36.0)
MCV: 86.8 fL (ref 78.0–100.0)
Monocytes Absolute: 0.1 K/uL (ref 0.1–1.0)
Monocytes Relative: 2 % — ABNORMAL LOW (ref 3–12)
Neutro Abs: 2.5 K/uL (ref 1.7–7.7)
Neutrophils Relative %: 76 % (ref 43–77)
Platelets: 246 10*3/uL (ref 150–400)
RBC: 4.24 MIL/uL (ref 3.87–5.11)
RDW: 13.1 % (ref 11.5–15.5)
WBC: 3.3 K/uL — ABNORMAL LOW (ref 4.0–10.5)

## 2012-08-27 MED ORDER — CEPHALEXIN 500 MG PO CAPS: 500.0000 mg | ORAL_CAPSULE | Freq: Three times a day (TID) | ORAL | Status: DC

## 2012-08-27 MED ORDER — PROMETHAZINE HCL 25 MG PO TABS: 25.0000 mg | ORAL_TABLET | Freq: Four times a day (QID) | ORAL | Status: DC | PRN

## 2012-08-27 MED ORDER — ONDANSETRON HCL 4 MG/2ML IJ SOLN
4.0000 mg | Freq: Once | INTRAMUSCULAR | Status: AC
  Administered 2012-08-27: 4 mg via INTRAVENOUS
  Filled 2012-08-27: qty 2

## 2012-08-27 MED ORDER — METOCLOPRAMIDE HCL 5 MG/ML IJ SOLN
10.0000 mg | Freq: Once | INTRAMUSCULAR | Status: AC
  Administered 2012-08-27: 10 mg via INTRAVENOUS
  Filled 2012-08-27: qty 2

## 2012-08-27 MED ORDER — ONDANSETRON 8 MG PO TBDP: 8.0000 mg | ORAL_TABLET | Freq: Two times a day (BID) | ORAL | Status: DC | PRN

## 2012-08-27 MED ORDER — MORPHINE SULFATE 2 MG/ML IJ SOLN
2.0000 mg | Freq: Once | INTRAMUSCULAR | Status: AC
  Administered 2012-08-27: 2 mg via INTRAVENOUS
  Filled 2012-08-27: qty 1

## 2012-08-27 MED ORDER — HYDROCODONE-ACETAMINOPHEN 5-325 MG PO TABS: ORAL_TABLET | ORAL | Status: DC

## 2012-08-27 MED ORDER — CLINDAMYCIN PHOSPHATE 900 MG/50ML IV SOLN
900.0000 mg | Freq: Once | INTRAVENOUS | Status: AC
  Administered 2012-08-27: 900 mg via INTRAVENOUS
  Filled 2012-08-27: qty 50

## 2012-08-27 MED ORDER — SULFAMETHOXAZOLE-TRIMETHOPRIM 800-160 MG PO TABS: 1.0000 | ORAL_TABLET | Freq: Two times a day (BID) | ORAL | Status: DC

## 2012-08-27 NOTE — ED Notes (Addendum)
Pt was seen at [facility] yesterday for elbow pain. Was dx'd with "contusion". Left elbow is now swollen and red around what pt says is a "stress sore" on her elbow. States she scratches it because of stress. Pt fell Friday night striking right elbow on a hard object.

## 2012-08-27 NOTE — ED Notes (Signed)
Pt given Ginger Ale and saltines.

## 2012-08-27 NOTE — ED Notes (Signed)
Pt escorted to [facility] ED where her daughter is a patient.

## 2012-08-27 NOTE — ED Provider Notes (Signed)
History    This chart was scribed for [name]. Oletta Lamas, MD, MD by Smitty Pluck, ED Scribe. The patient was seen in room TR09C and the patient's care was started at 9:08AM.   CSN: [id number]  Arrival date & time 08/27/12  0856   None     Chief Complaint  Patient presents with  . Elbow Pain    (Consider location/radiation/quality/duration/timing/severity/associated sxs/prior treatment) The history is provided by the patient. No language interpreter was used.   Jessica Vega is a 51 y.o. female who presents to the Emergency Department complaining of constant, moderate right elbow pain onset 2 days ago. Pt reports that she was sitting in chair and the chair leg broke causing her to fall out of chair. She tried to catch herself before falling out of chair but she hit her elbow. Pt reports that she was seen at ED for same reason 1 day ago and was discharged with instructions to use OTC tylenol for pain.  She states she had scar on the elbow in area of pain that has been present for 2 years. She denies any other pain. Unsure of chills or fever.  No lymphadenopathy.    Past Medical History  Diagnosis Date  . Hypertension     Past Surgical History  Procedure Date  . Abdominal hysterectomy   . Cholecystectomy     History reviewed. No pertinent family history.  History  Substance Use Topics  . Smoking status: Current Every Day Smoker -- 1.0 packs/day    Types: Cigarettes  . Smokeless tobacco: Never Used  . Alcohol Use: 0.6 oz/week    1 Glasses of wine per week     Comment: rarely    OB History    Grav Para Term Preterm Abortions TAB SAB Ect Mult Living                  Review of Systems  Constitutional: Negative for fever and chills.  HENT: Negative for neck pain.   Respiratory: Negative for shortness of breath.   Gastrointestinal: Negative for nausea and vomiting.  Musculoskeletal: Positive for joint swelling and arthralgias. Negative for back pain.  Skin: Positive  for color change and wound.  Neurological: Negative for weakness and numbness.  All other systems reviewed and are negative.    Allergies  Review of patient's allergies indicates no known allergies.  Home Medications   Current Outpatient Rx  Name  Route  Sig  Dispense  Refill  . ASPIRIN 325 MG PO TABS   Oral   Take 650 mg by mouth every 6 (six) hours as needed. For pain         . ASPIRIN EC 81 MG PO TBEC   Oral   Take 81 mg by mouth daily.         Marland Kitchen BENAZEPRIL HCL 20 MG PO TABS   Oral   Take 10 mg by mouth daily.         Marland Kitchen VITAMIN D 1000 UNITS PO TABS   Oral   Take 1,000 Units by mouth daily.         . OMEGA-3 FATTY ACIDS 1000 MG PO CAPS   Oral   Take 1 g by mouth daily.         Marland Kitchen HYDROCHLOROTHIAZIDE 25 MG PO TABS   Oral   Take 12.5 mg by mouth daily.         . CEPHALEXIN 500 MG PO CAPS   Oral   Take  1 capsule (500 mg total) by mouth 3 (three) times daily.   30 capsule   0   . HYDROCODONE-ACETAMINOPHEN 5-325 MG PO TABS      1-2 tablets po q 6 hours prn moderate to severe pain   20 tablet   0   . SULFAMETHOXAZOLE-TRIMETHOPRIM 800-160 MG PO TABS   Oral   Take 1 tablet by mouth 2 (two) times daily.   20 tablet   0     BP 134/80  Pulse 89  Temp 97.9 F (36.6 C) (Oral)  Resp 20  SpO2 97%  Physical Exam  Nursing note and vitals reviewed. Constitutional: She is oriented to person, place, and time. She appears well-developed and well-nourished. No distress.  HENT:  Head: Normocephalic and atraumatic.  Eyes: EOM are normal.  Neck: Neck supple. No tracheal deviation present.  Cardiovascular: Normal rate.   Pulmonary/Chest: Effort normal. No respiratory distress.  Musculoskeletal:       Right elbow: She exhibits decreased range of motion and swelling. She exhibits no deformity and no laceration.       Right distal tricep and elbow erythema, swelling and heat  ROM is still preserved    Lymphadenopathy:       Right axillary: No pectoral  and no lateral adenopathy present.  Neurological: She is alert and oriented to person, place, and time.  Skin: Skin is warm and dry. Abrasion noted. There is erythema. No cyanosis. Nails show no clubbing.     Psychiatric: She has a normal mood and affect. Her behavior is normal.    ED Course  Procedures (including critical care time) DIAGNOSTIC STUDIES: Oxygen Saturation is 97% on room air, normal by my interpretation.    COORDINATION OF CARE: 9:11 AM Discussed ED treatment with pt  9:22 AM Ordered:     .  morphine injection  2 mg Intravenous Once        Labs Reviewed  CBC WITH DIFFERENTIAL   No results found.   1. Cellulitis of elbow       MDM  I personally performed the services described in this documentation, which was scribed in my presence. The recorded information has been reviewed and is accurate.   Pt with skin break from fall and although contused, cellulitis has formed.  ROM is preserved, doubt elbow joint infection.  Will give 1 dose of IV abx, treat pain.  NEeds RX for pain and abx.  Will draw with surgical marker CBC ordered to establish a baseline WBC today.  Pt is not septic appearing, doesn't require extended stay.    [name]. Oletta Lamas, MD 08/27/12 [phone]

## 2012-08-27 NOTE — ED Notes (Addendum)
Pt continues to c/o nausea. Reported to Dr. Oletta Lamas.

## 2012-08-27 NOTE — ED Notes (Signed)
Pt continues to have intermittent epigastric pain and nausea.

## 2012-08-27 NOTE — ED Notes (Signed)
Pt feeling much better after ginger ale and crackers.

## 2012-08-27 NOTE — ED Notes (Signed)
Pt c/o nausea. Reported to MD. Orders received to Zofran.

## 2012-08-27 NOTE — Discharge Instructions (Signed)
 Cellulitis Cellulitis is an infection of the skin and the tissue beneath it. The infected area is usually red and tender. Cellulitis occurs most often in the arms and lower legs.  CAUSES  Cellulitis is caused by bacteria that enter the skin through cracks or cuts in the skin. The most common types of bacteria that cause cellulitis are Staphylococcus and Streptococcus. SYMPTOMS   Redness and warmth.  Swelling.  Tenderness or pain.  Fever. DIAGNOSIS  Your caregiver can usually determine what is wrong based on a physical exam. Blood tests may also be done. TREATMENT  Treatment usually involves taking an antibiotic medicine. HOME CARE INSTRUCTIONS   Take your antibiotics as directed. Finish them even if you start to feel better.  Keep the infected arm or leg elevated to reduce swelling.  Apply a warm cloth to the affected area up to 4 times per day to relieve pain.  Only take over-the-counter or prescription medicines for pain, discomfort, or fever as directed by your caregiver.  Keep all follow-up appointments as directed by your caregiver. SEEK MEDICAL CARE IF:   You notice red streaks coming from the infected area.  Your red area gets larger or turns dark in color.  Your bone or joint underneath the infected area becomes painful after the skin has healed.  Your infection returns in the same area or another area.  You notice a swollen bump in the infected area.  You develop new symptoms. SEEK IMMEDIATE MEDICAL CARE IF:   You have a fever.  You feel very sleepy.  You develop vomiting or diarrhea.  You have a general ill feeling (malaise) with muscle aches and pains. MAKE SURE YOU:   Understand these instructions.  Will watch your condition.  Will get help right away if you are not doing well or get worse. Document Released: 06/29/2005 Document Revised: 03/20/2012 Document Reviewed: 12/05/2011 [facility] Patient Information 2013 [place], [place].   Narcotic and  benzodiazepine use may cause drowsiness, slowed breathing or dependence.  Please use with caution and do not drive, operate machinery or watch young children alone while taking them.  Taking combinations of these medications or drinking alcohol will potentiate these effects.

## 2012-08-27 NOTE — ED Notes (Signed)
Skin marked at borders of redness right elbow.

## 2013-04-16 ENCOUNTER — Encounter (HOSPITAL_COMMUNITY): Payer: Self-pay | Admitting: Emergency Medicine

## 2013-04-16 ENCOUNTER — Emergency Department (HOSPITAL_COMMUNITY)
Admission: EM | Admit: 2013-04-16 | Discharge: 2013-04-16 | Discharge status: Against Medical Advice | Payer: Non-veteran care | Attending: Emergency Medicine | Admitting: Emergency Medicine

## 2013-04-16 DIAGNOSIS — M549 Dorsalgia, unspecified: Secondary | ICD-10-CM | POA: Insufficient documentation

## 2013-04-16 HISTORY — DX: Anemia, unspecified: D64.9

## 2013-04-16 NOTE — ED Notes (Signed)
PT. REPORTS LEFT UPPER BACK PAIN ONSET THIS MORNING DENIES INJURY OR FALL , RESPIRATIONS UNLABORED . ALERT AND ORIENTED , PAIN WORSE WITH PALPATION / CERTAIN POSITIONS.

## 2013-04-30 ENCOUNTER — Telehealth: Payer: Self-pay

## 2013-04-30 ENCOUNTER — Encounter (HOSPITAL_COMMUNITY): Payer: Self-pay | Admitting: Emergency Medicine

## 2013-04-30 ENCOUNTER — Emergency Department (HOSPITAL_COMMUNITY)
Admission: EM | Admit: 2013-04-30 | Discharge: 2013-04-30 | Disposition: A | Discharge status: Home / Self Care | Payer: Non-veteran care | Attending: Emergency Medicine | Admitting: Emergency Medicine

## 2013-04-30 ENCOUNTER — Emergency Department (HOSPITAL_COMMUNITY): Payer: Non-veteran care

## 2013-04-30 ENCOUNTER — Telehealth: Payer: Self-pay | Admitting: Hematology and Oncology

## 2013-04-30 DIAGNOSIS — F17200 Nicotine dependence, unspecified, uncomplicated: Secondary | ICD-10-CM | POA: Insufficient documentation

## 2013-04-30 DIAGNOSIS — D72819 Decreased white blood cell count, unspecified: Secondary | ICD-10-CM

## 2013-04-30 DIAGNOSIS — D649 Anemia, unspecified: Secondary | ICD-10-CM | POA: Insufficient documentation

## 2013-04-30 DIAGNOSIS — K089 Disorder of teeth and supporting structures, unspecified: Secondary | ICD-10-CM | POA: Insufficient documentation

## 2013-04-30 DIAGNOSIS — R079 Chest pain, unspecified: Secondary | ICD-10-CM | POA: Insufficient documentation

## 2013-04-30 DIAGNOSIS — M549 Dorsalgia, unspecified: Secondary | ICD-10-CM | POA: Insufficient documentation

## 2013-04-30 DIAGNOSIS — Z79899 Other long term (current) drug therapy: Secondary | ICD-10-CM | POA: Insufficient documentation

## 2013-04-30 DIAGNOSIS — I10 Essential (primary) hypertension: Secondary | ICD-10-CM | POA: Insufficient documentation

## 2013-04-30 DIAGNOSIS — D61818 Other pancytopenia: Secondary | ICD-10-CM | POA: Insufficient documentation

## 2013-04-30 LAB — POCT I-STAT, CHEM 8
Calcium, Ion: 1.13 mmol/L (ref 1.12–1.23)
Chloride: 108 mEq/L (ref 96–112)
HCT: 34 % — ABNORMAL LOW (ref 36.0–46.0)
Sodium: 140 mEq/L (ref 135–145)
TCO2: 21 mmol/L (ref 0–100)

## 2013-04-30 LAB — CBC
Platelets: 147 10*3/uL — ABNORMAL LOW (ref 150–400)
RBC: 3.26 MIL/uL — ABNORMAL LOW (ref 3.87–5.11)
WBC: 1.4 10*3/uL — CL (ref 4.0–10.5)

## 2013-04-30 LAB — POCT I-STAT TROPONIN I: Troponin i, poc: 0.05 ng/mL (ref 0.00–0.08)

## 2013-04-30 MED ORDER — HYDROCODONE-ACETAMINOPHEN 5-325 MG PO TABS: 2.0000 | ORAL_TABLET | ORAL | Status: DC | PRN

## 2013-04-30 MED ORDER — GI COCKTAIL ~~LOC~~
30.0000 mL | Freq: Once | ORAL | Status: AC
  Administered 2013-04-30: 30 mL via ORAL
  Filled 2013-04-30: qty 30

## 2013-04-30 MED ORDER — ONDANSETRON HCL 4 MG/2ML IJ SOLN
4.0000 mg | Freq: Once | INTRAMUSCULAR | Status: AC
  Administered 2013-04-30: 4 mg via INTRAVENOUS
  Filled 2013-04-30: qty 2

## 2013-04-30 MED ORDER — SODIUM CHLORIDE 0.9 % IV BOLUS (SEPSIS)
1000.0000 mL | Freq: Once | INTRAVENOUS | Status: AC
  Administered 2013-04-30: 1000 mL via INTRAVENOUS

## 2013-04-30 MED ORDER — FENTANYL CITRATE 0.05 MG/ML IJ SOLN
50.0000 ug | Freq: Once | INTRAMUSCULAR | Status: AC
  Administered 2013-04-30: 50 ug via INTRAVENOUS
  Filled 2013-04-30: qty 2

## 2013-04-30 MED ORDER — FAMOTIDINE 20 MG PO TABS: 20.0000 mg | ORAL_TABLET | Freq: Two times a day (BID) | ORAL | Status: DC

## 2013-04-30 MED ORDER — FAMOTIDINE 20 MG PO TABS
20.0000 mg | ORAL_TABLET | Freq: Once | ORAL | Status: AC
  Administered 2013-04-30: 20 mg via ORAL
  Filled 2013-04-30: qty 1

## 2013-04-30 NOTE — ED Provider Notes (Signed)
CSN: [id number]     Arrival date & time 04/30/13  0305 History     First MD Initiated Contact with Patient 04/30/13 0539     Chief Complaint  Patient presents with  . Chest Pain  . Back Pain  . Dental Pain   (Consider location/radiation/quality/duration/timing/severity/associated sxs/prior Treatment) HPI Hx per PT - R sided CP onset last night, sharp in quality, radiates to her back, below patient does not sure if this is back pain it radiates to the front. She also describes soreness in it feels better when she puts pressure on it. No SOB, no F/C. No N/V/D. PT admits to stress at home and in her job.  She has had this pain before many times, has told her PCP about this and when she takes Tums it usually goes away. She did not take any tums tonight. No trauma, no known aggravating factors. Prior Chole years ago.   Past Medical History  Diagnosis Date  . Hypertension   . Anemia    Past Surgical History  Procedure Laterality Date  . Abdominal hysterectomy    . Cholecystectomy     No family history on file. History  Substance Use Topics  . Smoking status: Current Every Day Smoker -- 1.00 packs/day    Types: Cigarettes  . Smokeless tobacco: Never Used  . Alcohol Use: 0.6 oz/week    1 Glasses of wine per week     Comment: rarely   OB History   Grav Para Term Preterm Abortions TAB SAB Ect Mult Living                 Review of Systems  Constitutional: Negative for fever and chills.  HENT: Negative for neck pain and neck stiffness.   Eyes: Negative for visual disturbance.  Respiratory: Negative for cough and shortness of breath.   Cardiovascular: Positive for chest pain.  Gastrointestinal: Negative for vomiting and abdominal pain.  Genitourinary: Negative for dysuria.  Musculoskeletal: Negative for back pain.  Skin: Negative for rash.  Neurological: Negative for headaches.  All other systems reviewed and are negative.    Allergies  Review of patient's allergies  indicates no known allergies.  Home Medications   Current Outpatient Rx  Name  Route  Sig  Dispense  Refill  . benazepril (LOTENSIN) 20 MG tablet   Oral   Take 10 mg by mouth daily.         . cholecalciferol (VITAMIN D) 1000 UNITS tablet   Oral   Take 1,000 Units by mouth daily.         . ferrous sulfate 325 (65 FE) MG tablet   Oral   Take 325 mg by mouth daily with breakfast.         . Ibuprofen (MOTRIN PO)   Oral   Take 2-3 tablets by mouth every 6 (six) hours as needed (pain).         . potassium chloride SA (K-DUR,KLOR-CON) 20 MEQ tablet   Oral   Take 20 mEq by mouth daily.          BP 148/76  Pulse 67  Temp(Src) 98 F (36.7 C) (Oral)  Resp 18  SpO2 100% Physical Exam  Constitutional: She is oriented to person, place, and time. She appears well-developed and well-nourished.  HENT:  Head: Normocephalic and atraumatic.  Eyes: EOM are normal. Pupils are equal, round, and reactive to light.  Neck: Neck supple.  Cardiovascular: Normal rate, regular rhythm and intact distal pulses.  Pulmonary/Chest: Effort normal and breath sounds normal. No respiratory distress.  No reproducible tenderness  Musculoskeletal: Normal range of motion. She exhibits no edema.  Neurological: She is alert and oriented to person, place, and time.  Skin: Skin is warm and dry.    ED Course   Procedures (including critical care time)   Date: 04/30/2013  Rate: 53  Rhythm: normal sinus rhythm  QRS Axis: normal  Intervals: normal  ST/T Wave abnormalities: nonspecific ST changes  Conduction Disutrbances:none  Narrative Interpretation:   Old EKG Reviewed: none available  Results for orders placed during the hospital encounter of 04/30/13  CBC      Result Value Range   WBC 1.4 (*) 4.0 - 10.5 K/uL   RBC 3.26 (*) 3.87 - 5.11 MIL/uL   Hemoglobin 11.0 (*) 12.0 - 15.0 g/dL   HCT 16.1 (*) 09.6 - 04.5 %   MCV 95.7  78.0 - 100.0 fL   MCH 33.7  26.0 - 34.0 pg   MCHC 35.3  30.0 -  36.0 g/dL   RDW 40.9  81.1 - 91.4 %   Platelets 147 (*) 150 - 400 K/uL  POCT I-STAT TROPONIN I      Result Value Range   Troponin i, poc 0.05  0.00 - 0.08 ng/mL   Comment 3           POCT I-STAT, CHEM 8      Result Value Range   Sodium 140  135 - 145 mEq/L   Potassium 3.7  3.5 - 5.1 mEq/L   Chloride 108  96 - 112 mEq/L   BUN 5 (*) 6 - 23 mg/dL   Creatinine, Ser 7.82  0.50 - 1.10 mg/dL   Glucose, Bld 99  70 - 99 mg/dL   Calcium, Ion 9.56  2.13 - 1.23 mmol/L   TCO2 21  0 - 100 mmol/L   Hemoglobin 11.6 (*) 12.0 - 15.0 g/dL   HCT 08.6 (*) 57.8 - 46.9 %   Dg Chest 2 View  04/30/2013   *RADIOLOGY REPORT*  Clinical Data: Chest pain.  Back pain.  CHEST - 2 VIEW  Comparison: 08/16/2009.  Findings:  Cardiopericardial silhouette within normal limits. Mediastinal contours normal. Trachea midline.  No airspace disease or effusion. Cholecystectomy clips are present in the right upper quadrant.  IMPRESSION: No active cardiopulmonary disease.   Original Report Authenticated By: Andreas Newport, M.D.   IV fentanyl, Zofran, IV fluids.  Pepcid. GI cocktail.  7:21 AM pain improve his medications. Labs reviewed with the patient as above. She states she has a history of low blood counts and she believes low white blood cell count. She has paperwork with her from the [place] but is unable to find record of her previous labs. She is taking iron as prescribed  Plan discharge home with pain medications as needed, hematology referral. Patient agrees to strict return precautions and evaluation for any fevers or concern for infection.  MDM  Back pain/chest pain without fever or shortness of breath. Workup as above with imaging and labs and EKG all reviewed. Improved with medications Vital signs in nursing notes reviewed and considered  Sunnie Nielsen, MD 04/30/13 [phone]

## 2013-04-30 NOTE — ED Notes (Signed)
Per ems-- pt from home. Pt reports pain in back that goes to R chest that started 30 mins ago but has had this pain several times. Pt also reports nv. Denies sob. R arm pain.  Pt also reports dental pain to L side. Vs stable. bp- 124/90 hr- 77 o2- 99 r- 20.

## 2013-04-30 NOTE — Telephone Encounter (Signed)
PT CALLED WAS D/C FROM HOSP ON THIS MORNING WAS TOLD TO F/U W/HEMATOLOGIST. APPT 08/05 @ 10:30 W/DR. VICTOR.

## 2013-04-30 NOTE — Telephone Encounter (Signed)
C/D 04/30/13 for appt. 05/07/13

## 2013-05-07 ENCOUNTER — Other Ambulatory Visit (HOSPITAL_BASED_OUTPATIENT_CLINIC_OR_DEPARTMENT_OTHER): Payer: Non-veteran care | Admitting: Lab

## 2013-05-07 ENCOUNTER — Encounter: Payer: Self-pay | Admitting: Hematology and Oncology

## 2013-05-07 ENCOUNTER — Ambulatory Visit (HOSPITAL_BASED_OUTPATIENT_CLINIC_OR_DEPARTMENT_OTHER): Payer: Non-veteran care | Admitting: Hematology and Oncology

## 2013-05-07 ENCOUNTER — Ambulatory Visit: Payer: Non-veteran care

## 2013-05-07 VITALS — BP 120/76 | HR 64 | Temp 97.5°F | Resp 20 | Ht 62.5 in | Wt 153.6 lb

## 2013-05-07 DIAGNOSIS — D61818 Other pancytopenia: Secondary | ICD-10-CM

## 2013-05-07 LAB — CBC WITH DIFFERENTIAL/PLATELET
BASO%: 0 % (ref 0.0–2.0)
EOS%: 0.5 % (ref 0.0–7.0)
HGB: 10.6 g/dL — ABNORMAL LOW (ref 11.6–15.9)
MCH: 32.7 pg (ref 25.1–34.0)
MCHC: 33.7 g/dL (ref 31.5–36.0)
MONO#: 0.1 10*3/uL (ref 0.1–0.9)
RDW: 13.6 % (ref 11.2–14.5)
WBC: 1.9 10*3/uL — ABNORMAL LOW (ref 3.9–10.3)
lymph#: 1.7 10*3/uL (ref 0.9–3.3)
nRBC: 0 % (ref 0–0)

## 2013-05-07 LAB — TECHNOLOGIST REVIEW

## 2013-05-07 LAB — VITAMIN B12: Vitamin B-12: 326 pg/mL (ref 211–911)

## 2013-05-07 LAB — CHCC SMEAR

## 2013-05-07 NOTE — Progress Notes (Signed)
I checked in new patient with no financial issues. [facility] is [facility] and was told all would be faxed to [place]. She took my card and will make sure they sent it. She wants mail and phone only for communication. [facility] with [facility] is [facility]. Benjaman Kindler. I will send to get set up in system. I didn't ask if she had living will or POA

## 2013-05-08 ENCOUNTER — Other Ambulatory Visit: Payer: Self-pay | Admitting: Radiology

## 2013-05-09 ENCOUNTER — Ambulatory Visit (HOSPITAL_COMMUNITY)
Admission: RE | Admit: 2013-05-09 | Discharge: 2013-05-09 | Disposition: A | Discharge status: Home / Self Care | Payer: Non-veteran care | Source: Ambulatory Visit | Attending: Hematology and Oncology | Admitting: Hematology and Oncology

## 2013-05-09 ENCOUNTER — Encounter (HOSPITAL_COMMUNITY): Payer: Self-pay | Admitting: Pharmacy Technician

## 2013-05-09 ENCOUNTER — Encounter (HOSPITAL_COMMUNITY): Payer: Self-pay

## 2013-05-09 DIAGNOSIS — C9200 Acute myeloblastic leukemia, not having achieved remission: Secondary | ICD-10-CM | POA: Insufficient documentation

## 2013-05-09 DIAGNOSIS — D649 Anemia, unspecified: Secondary | ICD-10-CM | POA: Insufficient documentation

## 2013-05-09 DIAGNOSIS — I10 Essential (primary) hypertension: Secondary | ICD-10-CM | POA: Insufficient documentation

## 2013-05-09 DIAGNOSIS — D61818 Other pancytopenia: Secondary | ICD-10-CM | POA: Insufficient documentation

## 2013-05-09 LAB — BONE MARROW EXAM

## 2013-05-09 LAB — APTT: aPTT: 29 seconds (ref 24–37)

## 2013-05-09 LAB — PROTIME-INR
INR: 0.98 (ref 0.00–1.49)
Prothrombin Time: 12.8 seconds (ref 11.6–15.2)

## 2013-05-09 MED ORDER — FENTANYL CITRATE 0.05 MG/ML IJ SOLN
INTRAMUSCULAR | Status: AC | PRN
  Administered 2013-05-09: 100 ug via INTRAVENOUS

## 2013-05-09 MED ORDER — SODIUM CHLORIDE 0.9 % IV SOLN
Freq: Once | INTRAVENOUS | Status: AC
  Administered 2013-05-09: 08:00:00 via INTRAVENOUS

## 2013-05-09 MED ORDER — FENTANYL CITRATE 0.05 MG/ML IJ SOLN
INTRAMUSCULAR | Status: AC
  Filled 2013-05-09: qty 4

## 2013-05-09 MED ORDER — MIDAZOLAM HCL 2 MG/2ML IJ SOLN
INTRAMUSCULAR | Status: AC
  Filled 2013-05-09: qty 4

## 2013-05-09 MED ORDER — MIDAZOLAM HCL 2 MG/2ML IJ SOLN
INTRAMUSCULAR | Status: AC | PRN
  Administered 2013-05-09: 2 mg via INTRAVENOUS

## 2013-05-09 NOTE — Procedures (Signed)
Technically successful CT guided bone marrow aspiration and biopsy of left iliac crest. No immediate complications. Awaiting pathology report.    

## 2013-05-09 NOTE — H&P (Signed)
Jessica Vega is an 52 y.o. female.   Chief Complaint: "I'm here to have a bone marrow sampling" HPI: Patient with history of pancytopenia presents today for CT guided bone marrow biopsy.  Past Medical History  Diagnosis Date  . Hypertension   . Anemia     Past Surgical History  Procedure Laterality Date  . Abdominal hysterectomy    . Cholecystectomy      History reviewed. No pertinent family history. Social History:  reports that she has been smoking Cigarettes.  She has been smoking about 1.00 pack per day. She has never used smokeless tobacco. She reports that she drinks about 0.6 ounces of alcohol per week. She reports that she does not use illicit drugs.  Allergies: No Known Allergies  Current outpatient prescriptions:aspirin 81 MG tablet, Take 81 mg by mouth daily., Disp: , Rfl: ;  benazepril (LOTENSIN) 20 MG tablet, Take 10 mg by mouth daily., Disp: , Rfl: ;  cholecalciferol (VITAMIN D) 1000 UNITS tablet, Take 1,000 Units by mouth daily., Disp: , Rfl: ;  famotidine (PEPCID) 20 MG tablet, Take 1 tablet (20 mg total) by mouth 2 (two) times daily., Disp: 30 tablet, Rfl: 0 ferrous sulfate 325 (65 FE) MG tablet, Take 325 mg by mouth daily with breakfast., Disp: , Rfl: ;  HYDROcodone-acetaminophen (NORCO/VICODIN) 5-325 MG per tablet, Take 2 tablets by mouth every 4 (four) hours as needed for pain., Disp: 10 tablet, Rfl: 0;  ibuprofen (ADVIL,MOTRIN) 200 MG tablet, Take 400 mg by mouth every 6 (six) hours as needed for pain., Disp: , Rfl:  potassium chloride SA (K-DUR,KLOR-CON) 20 MEQ tablet, Take 20 mEq by mouth daily., Disp: , Rfl:  No current facility-administered medications for this encounter. Facility-Administered Medications Ordered in Other Encounters: fentaNYL (SUBLIMAZE) 0.05 MG/ML injection, , , , ;  midazolam (VERSED) 2 MG/2ML injection, , , ,    Results for orders placed during the hospital encounter of 05/09/13 (from the past 48 hour(s))  APTT     Status: None   Collection Time    05/09/13  7:25 AM      Result Value Range   aPTT 29  24 - 37 seconds  PROTIME-INR     Status: None   Collection Time    05/09/13  7:25 AM      Result Value Range   Prothrombin Time 12.8  11.6 - 15.2 seconds   INR 0.98  0.00 - 1.49   No results found.  Review of Systems  Constitutional: Negative for fever and chills.  HENT:       Occ HA's  Respiratory: Negative for shortness of breath.        Occ cough  Cardiovascular:       Intermittent CP with rad to back and down rt arm  Gastrointestinal: Negative for vomiting.       Occ nausea, abd pain  Genitourinary: Negative for dysuria.  Musculoskeletal:       Occ back pain  Psychiatric/Behavioral: The patient is nervous/anxious.     Blood pressure 120/74, pulse 64, temperature 97.2 F (36.2 C), temperature source Oral, resp. rate 20, height 5' 2.5" (1.588 m), weight 153 lb (69.4 kg), SpO2 99.00%. Physical Exam  Constitutional: She is oriented to person, place, and time. She appears well-developed and well-nourished.  Cardiovascular: Normal rate and regular rhythm.   Respiratory: Effort normal and breath sounds normal.  GI: Soft. Bowel sounds are normal. There is no tenderness.  Musculoskeletal: Normal range of motion. She exhibits no  edema.  Neurological: She is alert and oriented to person, place, and time.     Assessment/Plan Pt with hx pancytopenia. Plan is for CT guided bone marrow biopsy today. Details/risks of procedure d/w pt/mother with their understanding and consent.  Azlin Zilberman,D KEVIN 05/09/2013, 8:16 AM

## 2013-05-10 NOTE — Progress Notes (Signed)
Jessica Vega [facility]  [phone] [phone]     INITIAL HEMATOLOGY CONSULTATION    Referral MD:    Reason for Referral: Pancytopenia.  HPI: the patient is a 52 y.o.female who at the end of last month because of spasm in chest and back. CBC was done which revealed pancytopenia and the patient was sent to hematologist for consultation. On 0722/2014 WBC 1.59; Hgb 10,9; Hct 32.1; Plt 170; ANC 0.24; ALC 1.29.    Past Medical History  Diagnosis Date  . Hypertension   . Anemia   :    Past Surgical History  Procedure Laterality Date  . Abdominal hysterectomy    . Cholecystectomy    :   CURRENT MEDS: Current Outpatient Prescriptions  Medication Sig Dispense Refill  . aspirin 81 MG tablet Take 81 mg by mouth daily.      . benazepril (LOTENSIN) 20 MG tablet Take 10 mg by mouth daily.      . cholecalciferol (VITAMIN D) 1000 UNITS tablet Take 1,000 Units by mouth daily.      . famotidine (PEPCID) 20 MG tablet Take 1 tablet (20 mg total) by mouth 2 (two) times daily.  30 tablet  0  . ferrous sulfate 325 (65 FE) MG tablet Take 325 mg by mouth daily with breakfast.      . HYDROcodone-acetaminophen (NORCO/VICODIN) 5-325 MG per tablet Take 2 tablets by mouth every 4 (four) hours as needed for pain.  10 tablet  0  . potassium chloride SA (K-DUR,KLOR-CON) 20 MEQ tablet Take 20 mEq by mouth daily.      Marland Kitchen ibuprofen (ADVIL,MOTRIN) 200 MG tablet Take 400 mg by mouth every 6 (six) hours as needed for pain.       No current facility-administered medications for this visit.   Family history: Mother: CVA. Father CAD, DM, Kidney disease, Brother: kidney transplant. Another brother:liver problem. Sister- O.K.   No Known Allergies:  No family history on file.:  History   Social History  . Marital Status: Divorced    Spouse Name: N/A    Number of Children: N/A  . Years of Education: N/A   Occupational History  . Not on file.   Social History Main Topics    . Smoking status: Current Every Day Smoker -- 1.00 packs/day    Types: Cigarettes  . Smokeless tobacco: Never Used  . Alcohol Use: 0.6 oz/week    1 Glasses of wine per week     Comment: rarely  . Drug Use: No  . Sexually Active: Yes   Other Topics Concern  . Not on file   Social History Narrative  . No narrative on file  :  REVIEW OF SYSTEM:  The patient denied fever, chills, night sweats. Her appetite is good. Weight.is stable. She has hot flushes. Occasionally patient has headache and blurry vision. She sometimes has nasal congestion and yellowish nasal discharge.  Kieli denied  double vision, hearing problems, odynophagia or dysphagia except dysphagia for big pills.. The patient reported chest discomfort, dyspnea with muscle spasm together with cough and  greenish or yellowish sputum. Occasionally she has abdominal pain, nausea, diarrhea or constipation. She reported nocturia (urinate more then 3 times a night).  She reported pain in her shoulders and arms, pain in her back, and numbness and tingling in hands and feet. No  palpitations, vomiting, hematochezia,  dysuria, polyuria, hematuria, myalgia, psychiatric problems.  Exam: BP 120/76; pulse rate 64; temp 97.5; resp 14. Wt 153 lb 9  oz; Ht 5\' 25" .  General:  well-nourished in no acute distress.  Eyes:  no scleral icterus.  ENT:  There were no oropharyngeal lesions.  Neck was without thyromegaly.  Lymphatics:  Negative cervical, supraclavicular or axillary adenopathy.  Respiratory: lungs were clear bilaterally without wheezing or crackles.  Cardiovascular:  Regular rate and rhythm, S1/S2, without murmur, rub or gallop.  There was no pedal edema.  GI:  abdomen was soft, flat, nontender, nondistended, without organomegaly.  Muscoloskeletal:  no spinal tenderness of palpation of vertebral spine.  Skin exam was without ecchymosis, petechiae.  Neuro exam was nonfocal.  Patient was able to get on and off exam table without assistance.  Gait  was normal.  Patient was alerted and oriented.  Attention was good.   Language was appropriate.  Mood was normal without depression.  Speech was not pressured.  Thought content was not tangential.    LABS:  Lab Results  Component Value Date   WBC 1.9* 05/07/2013   HGB 10.6* 05/07/2013   HCT 31.5* 05/07/2013   PLT 134* 05/07/2013   GLUCOSE 99 04/30/2013   NA 140 04/30/2013   K 3.7 04/30/2013   CL 108 04/30/2013   CREATININE 0.70 04/30/2013   BUN 5* 04/30/2013   INR 0.98 05/09/2013    Dg Chest 2 View  04/30/2013   *RADIOLOGY REPORT*  Clinical Data: Chest pain.  Back pain.  CHEST - 2 VIEW  Comparison: 08/16/2009.  Findings:  Cardiopericardial silhouette within normal limits. Mediastinal contours normal. Trachea midline.  No airspace disease or effusion. Cholecystectomy clips are present in the right upper quadrant.  IMPRESSION: No active cardiopulmonary disease.   Original Report Authenticated By: Andreas Newport, M.D.   Ct Biopsy  05/09/2013   *RADIOLOGY REPORT*  Indication: Pancytopenia  CT GUIDED LEFT ILIAC BONE MARROW ASPIRATION AND BONE MARROW CORE BIOPSIES  Intravenous medications: Fentanyl 100 mcg IV; Versed 2 mg IV  Sedation time: 10 minutes  Contrast volume: None  Complications: None immediate  PROCEDURE/FINDINGS:  Informed consent was obtained from the patient following an explanation of the procedure, risks, benefits and alternatives. The patient understands, agrees and consents for the procedure. All questions were addressed.  A time out was performed prior to the initiation of the procedure.  The patient was positioned prone and noncontrast localization CT was performed of the pelvis to demonstrate the iliac marrow spaces.  The operative site was prepped and draped in the usual sterile fashion.  Under sterile conditions and local anesthesia, an 11 gauge coaxial bone biopsy needle was advanced into the left iliac marrow space. Needle position was confirmed with CT imaging.  Initially, bone marrow  aspiration was performed. Next, a bone marrow biopsy was obtained with the 11 gauge outer bone marrow device.  Samples were prepared with the cytotechnologist and deemed adequate.  The needle was removed intact.  Hemostasis was obtained with compression and a dressing was placed. The patient tolerated the procedure well without immediate post procedural complication.  IMPRESSION:  Successful CT guided left iliac bone marrow aspiration and core biopsies.   Original Report Authenticated By: Tacey Ruiz, MD    Blood smear review:   I personally reviewed the patient's peripheral blood smear today.  There was neutropenia  There was no peripheral blast.  There was no schistocytosis, spherocytosis, target cell, rouleaux formation, tear drop cell.  There was no giant platelets or platelet clumps.      ASSESSMENT AND PLAN:  1. Pancytopenia. We will do bone marrow  biopsy and aspiration ASAP, We will check serum vitamin B12 level. I  will see patient latter this week or next week when results of bone marrow biopsy will be available.    The length of time of the face-to-face encounter was 45 minutes. More than 50% of time was spent counseling and coordination of care.   Myra Rude, MD, PhD.  05/07/2013 05:45 p.m.  Thank you for this referral. Addendum (05/10/2013); Pathology notified me that patient has AML  I spoke with Dr, Michae Kava L. Lowell Guitar who agreed to admit patient in [facility]. I will call to the patient to discuss diagnosis and recommend to be admitted to [facility]. I copy pathology reports from today to this note.  Myra Rude, MD, PhD. 05/10/2013  Patient: Jessica Vega Collected: 05/09/2013 Client: [facility] Accession: [id number] Received: 05/09/2013 Myra Rude, MD DOB: 1961-01-24 Age: 45 Gender: F Reported: 05/10/2013 [address] Patient Ph: [phone] MRN #: [id number] [place], [place] [address] Visit #: [id number] Chart #: Phone:  Fax: CC: FLOW CYTOMETRY REPORT INTERPRETATION Interpretation Bone Marrow Flow Cytometry - ACUTE MYELOID LEUKEMIA. Diagnosis Comment: These findings correlate with the findings in the bone marrow specimen ([id number]). Valinda Hoar MD Pathologist, Electronic Signature (Case signed 05/10/2013) GROSS AND MICROSCOPIC INFORMATION Source Bone Marrow Flow Cytometry Microscopic Gated population: Flow cytometric immunophenotyping is performed using antibiodies to the antigens listed in the table below. Electronic gates are placed around a cell cluster displaying light scatter properties corresponding to blasts. - Abnormal Cells in sample: 65 % - Phenotype of Abnormal Cells: CD13, CD34, CD117, HLA-Dr, MPO, CD11c, and CD33 (dim). 1 of 2 FINAL for EARA, BURRUEL ([id number]) Specimen Table Lymphoid Associated Myeloid Associated Misc. As CD2 neg CD19 neg CD11c pos CD45 pos CD3 neg CD20 neg CD13 pos HLA-DR pos CD4 neg CD21 neg CD14 neg CD10 neg CD5 neg CD22 neg CD15 neg CD56/16 ND CD7 neg CD23 neg CD33 dim ZAP70 ND CD8 neg CD103 ND MPO pos CD34 pos CD25 ND FMC7 ND CD117 pos CD52 ND sKappa neg CD38 ND sLambda neg cKappa ND cLambda ND Gross ref. [id number] All controls stained appropriately. The above tests were developed and their performance characteristics determined by the [facility]. They have not been cleared or approved by the U.S. Food and Drug Administration." Report signed from the following location(s) Interpretation performed at [facility] [address], [place], [place] [address]. CLIA #: [id number], FINAL for KENESHIA, TENA ([id number]) Patient: MARYANNA, STUBER Collected: 05/09/2013 Client: [facility] Accession: [id number] Received: 05/09/2013 John "Katherina Right, MD DOB: 01/27/1961 Age: 66 Gender: F Reported: 05/10/2013 [address] Patient Ph: [phone] MRN #: [id number] [place], [place] [address] Visit #: [id number]  Chart #: Phone: [phone] Fax: CC: Myra Rude, MD BONE MARROW REPORT FINAL DIAGNOSIS Diagnosis Bone Marrow, Aspirate,Biopsy, and Clot - ACUTE MYELOID LEUKEMIA. - SEE COMMENT. PERIPHERAL BLOOD: - PANCYTOPENIA. - RARE CIRCULATING BLASTS. Diagnosis Note The bone marrow exhibits increased blasts (56%). Flow cytometry ([id number]) confirms increased blasts with a myeloid phenotype and CD34 expression. Overall, these findings are consistent with acute myeloid leukemia. Correlation with cytogenetics is recommend for further classification. These findings were discussed with Dr. Daiva Huge on 05/10/2013. Valinda Hoar MD Pathologist, Electronic Signature (Case signed 05/10/2013) GROSS AND MICROSCOPIC INFORMATION Specimen Clinical Information bx for pancytopenia (kp) Source Bone Marrow, Aspirate,Biopsy, and Clot Microscopic LAB DATA: CBC performed on 05/07/2013 shows: WBC 1.9 K/ul Neutrophils 7% HB 10.6 g/dl Lymphocytes 62% HCT 70.3 % Monocytes  1% 1 of 3 FINAL for SORAYA, PAQUETTE ([id number]) Microscopic(continued) MCV 97.2 fL Eosinophils 0% RDW 13.6 % Basophils 0% PLT 134 K/ul PERIPHERAL BLOOD SMEAR: There is a normocytic normochromic anemia. Red cell morphology is relatively unremarkable. Leukocytes are reduced, consisting predominately of lymphocytes. A rare circulating blast is seen. Platelets are decreased. BONE MARROW ASPIRATE: Spicular and cellular. Erythroid precursors: Decreased in numbers. No significant dysplasia identified. Granulocytic precursors: Increased blasts are present (56% by manual differential counts). The blasts are small to intermediate in size, with slightly irregular nuclear contours, prominent nucleoli, and scant cytoplasm. No Auer rods are identified. Megakaryocytes: Decreased in numbers. Morphologically unremarkable. Lymphocytes/plasma cells: Lymphocytes are relatively increased. Plasma cells are not increased. TOUCH PREPARATIONS: Similar to  aspirate smears. CLOT and BIOPSY: The bone marrow is variably cellular (20-60%) with large foci of blasts. The blasts are predominately small with irregular nuclear contours. Background hematopoiesis is present, but reduced. There is a benign appearing lymphoid aggregate on the clot section. IRON STAIN: Iron stains are performed on a bone marrow aspirate smear and section of clot. The controls stained appropriately. Storage Iron: Present. Ringed Sideroblasts: Absent. ADDITIONAL DATA / TESTING: Flow cytometry was performed, please see ([id number]). Cytogenetic studies were ordered and will be reported separately. Specimen Table Bone Marrow count performed on 500 cells shows: Blasts: 52% Myeloid 53% Promyelocyts: 0% Myelocytes: 0% Erythroid 22% Metamyelocyts: 0% Bands: 0% Lymphocytes: 21% Neutrophils: 1% Eosinophils: 0% Plasma Cells: 3% Basophils: 0% Monocytes: 1% M:E ratio: 2.36 Gross Received in [employer] is a 0.3 x 0.3 x 0.1 cm aggregate of dark red soft tissue / material, submitted in block A. Also received in [employer] in a separate container is a 1.5 x 0.2 cm core of tan red firm tissue submitted in block B following decalcification. (SW:caf 05/10/13) 2 of 3 FINAL for TIMIKO, OFFUTT ([id number]) Stain(s) Used in Diagnosis The following stain(s) were used in diagnosing the case: Iron Stain. The control(s) stained appropriately. Report signed out from following location(s) Engineer, production and Interpretation performed at [facility] [address], [place], [place] [address]. CLIA #: [id number], 3 of 3

## 2013-05-17 LAB — CHROMOSOME ANALYSIS, BONE MARROW

## 2013-05-20 ENCOUNTER — Telehealth: Payer: Self-pay | Admitting: Hematology and Oncology

## 2013-05-20 ENCOUNTER — Other Ambulatory Visit: Payer: Self-pay | Admitting: *Deleted

## 2013-05-20 NOTE — Telephone Encounter (Signed)
s.w. pt family member and advised on sept appt...mailed pt appt sched/avs and letter

## 2013-05-20 NOTE — Telephone Encounter (Signed)
s.w. pt family member and advised on sept appt...mailed pt appt sched/avs and letter °

## 2013-06-12 ENCOUNTER — Telehealth: Payer: Self-pay | Admitting: Hematology and Oncology

## 2013-06-12 NOTE — Telephone Encounter (Signed)
Moved 9/15 appt from CP2 to NG. S/w pt's mom and per mom pt still in hosp @ [facility] and will probably be there for 2 more weeks. appt cx'd and mom will call us when pt is d/c'd. Desk nurse informed.

## 2013-06-17 ENCOUNTER — Ambulatory Visit: Payer: Non-veteran care | Admitting: Hematology and Oncology

## 2013-07-02 ENCOUNTER — Encounter: Payer: Self-pay | Admitting: Hematology and Oncology

## 2013-09-10 ENCOUNTER — Encounter (HOSPITAL_COMMUNITY): Payer: Self-pay

## 2014-09-22 ENCOUNTER — Telehealth: Payer: Self-pay | Admitting: *Deleted

## 2014-09-22 NOTE — Telephone Encounter (Signed)
sw pt infromed he that i need for the pt to give me a call. Gave my name and number made him aware that the doctors office that's listed on her insurance card would not give an NPI number due to she does not attend that office....td

## 2014-09-25 ENCOUNTER — Ambulatory Visit: Payer: Non-veteran care

## 2016-01-05 DIAGNOSIS — G8929 Other chronic pain: Secondary | ICD-10-CM | POA: Diagnosis not present

## 2016-01-05 DIAGNOSIS — M549 Dorsalgia, unspecified: Secondary | ICD-10-CM | POA: Diagnosis not present

## 2016-01-05 DIAGNOSIS — D89813 Graft-versus-host disease, unspecified: Secondary | ICD-10-CM | POA: Diagnosis not present

## 2016-01-05 DIAGNOSIS — Z79899 Other long term (current) drug therapy: Secondary | ICD-10-CM | POA: Diagnosis not present

## 2016-01-05 DIAGNOSIS — C9201 Acute myeloblastic leukemia, in remission: Secondary | ICD-10-CM | POA: Diagnosis not present

## 2016-01-05 DIAGNOSIS — Z888 Allergy status to other drugs, medicaments and biological substances status: Secondary | ICD-10-CM | POA: Diagnosis not present

## 2016-01-05 DIAGNOSIS — F419 Anxiety disorder, unspecified: Secondary | ICD-10-CM | POA: Diagnosis not present

## 2016-01-05 DIAGNOSIS — I10 Essential (primary) hypertension: Secondary | ICD-10-CM | POA: Diagnosis not present

## 2016-01-05 DIAGNOSIS — Z9481 Bone marrow transplant status: Secondary | ICD-10-CM | POA: Diagnosis not present

## 2016-01-14 DIAGNOSIS — Z9481 Bone marrow transplant status: Secondary | ICD-10-CM | POA: Diagnosis not present

## 2016-01-14 DIAGNOSIS — R0602 Shortness of breath: Secondary | ICD-10-CM | POA: Diagnosis not present

## 2016-04-12 DIAGNOSIS — I10 Essential (primary) hypertension: Secondary | ICD-10-CM | POA: Diagnosis not present

## 2016-04-12 DIAGNOSIS — Z91048 Other nonmedicinal substance allergy status: Secondary | ICD-10-CM | POA: Diagnosis not present

## 2016-04-12 DIAGNOSIS — Z9484 Stem cells transplant status: Secondary | ICD-10-CM | POA: Diagnosis not present

## 2016-04-12 DIAGNOSIS — Z881 Allergy status to other antibiotic agents status: Secondary | ICD-10-CM | POA: Diagnosis not present

## 2016-04-12 DIAGNOSIS — M549 Dorsalgia, unspecified: Secondary | ICD-10-CM | POA: Diagnosis not present

## 2016-04-12 DIAGNOSIS — C9200 Acute myeloblastic leukemia, not having achieved remission: Secondary | ICD-10-CM | POA: Diagnosis not present

## 2016-04-12 DIAGNOSIS — J479 Bronchiectasis, uncomplicated: Secondary | ICD-10-CM | POA: Diagnosis not present

## 2016-04-12 DIAGNOSIS — F419 Anxiety disorder, unspecified: Secondary | ICD-10-CM | POA: Diagnosis not present

## 2016-04-12 DIAGNOSIS — Z9481 Bone marrow transplant status: Secondary | ICD-10-CM | POA: Diagnosis not present

## 2016-04-12 DIAGNOSIS — F17210 Nicotine dependence, cigarettes, uncomplicated: Secondary | ICD-10-CM | POA: Diagnosis not present

## 2016-09-27 ENCOUNTER — Encounter (HOSPITAL_COMMUNITY): Payer: Self-pay | Admitting: Family Medicine

## 2016-09-27 ENCOUNTER — Ambulatory Visit (HOSPITAL_COMMUNITY)
Admission: EM | Admit: 2016-09-27 | Discharge: 2016-09-27 | Disposition: A | Discharge status: Home / Self Care | Payer: Medicaid Other | Attending: Family Medicine | Admitting: Family Medicine

## 2016-09-27 DIAGNOSIS — I10 Essential (primary) hypertension: Secondary | ICD-10-CM | POA: Diagnosis not present

## 2016-09-27 DIAGNOSIS — J40 Bronchitis, not specified as acute or chronic: Secondary | ICD-10-CM

## 2016-09-27 MED ORDER — ALBUTEROL SULFATE 108 (90 BASE) MCG/ACT IN AEPB: 2.0000 | INHALATION_SPRAY | RESPIRATORY_TRACT | 2 refills | Status: AC | PRN

## 2016-09-27 MED ORDER — AZITHROMYCIN 250 MG PO TABS: 250.0000 mg | ORAL_TABLET | Freq: Every day | ORAL | 0 refills | Status: DC

## 2016-09-27 MED ORDER — PREDNISONE 20 MG PO TABS: ORAL_TABLET | ORAL | 0 refills | Status: DC

## 2016-09-27 MED ORDER — AMLODIPINE BESYLATE 5 MG PO TABS: 5.0000 mg | ORAL_TABLET | Freq: Every day | ORAL | 3 refills | Status: DC

## 2016-09-27 MED ORDER — HYDROCODONE-HOMATROPINE 5-1.5 MG/5ML PO SYRP: 5.0000 mL | ORAL_SOLUTION | Freq: Four times a day (QID) | ORAL | 0 refills | Status: DC | PRN

## 2016-09-27 NOTE — ED Provider Notes (Signed)
[place]    CSN: [id number] Arrival date & time: 09/27/16  1508     History   Chief Complaint Chief Complaint  Patient presents with  . Cough    HPI Jessica Vega is a 55 y.o. female.   This is a 55 year old woman who comes to [facility] urgent care center having survived leukemia 2 years ago, with a cough for 4 days duration. She continues to smoke although she's really trying her best to quit. She's been somewhat short of breath when she climbs a flight of stairs.  She would also like to have her blood pressure medicine refill.      Past Medical History:  Diagnosis Date  . Anemia   . Hypertension     Patient Active Problem List   Diagnosis Date Noted  . Other pancytopenia ([place]) 04/30/2013    Past Surgical History:  Procedure Laterality Date  . ABDOMINAL HYSTERECTOMY    . CHOLECYSTECTOMY      OB History    No data available       Home Medications    Prior to Admission medications   Medication Sig Start Date End Date Taking? Authorizing Provider  Albuterol Sulfate (PROAIR RESPICLICK) 123XX123 (90 Base) MCG/ACT AEPB Inhale 2 puffs into the lungs every 4 (four) hours as needed. 09/27/16   Robyn Haber, MD  amLODipine (NORVASC) 5 MG tablet Take 1 tablet (5 mg total) by mouth daily. 09/27/16   Robyn Haber, MD  aspirin 81 MG tablet Take 81 mg by mouth daily.    Historical Provider, MD  azithromycin (ZITHROMAX) 250 MG tablet Take 1 tablet (250 mg total) by mouth daily. Take first 2 tablets together, then 1 every day until finished. 09/27/16   Robyn Haber, MD  cholecalciferol (VITAMIN D) 1000 UNITS tablet Take 1,000 Units by mouth daily.    Historical Provider, MD  famotidine (PEPCID) 20 MG tablet Take 1 tablet (20 mg total) by mouth 2 (two) times daily. 04/30/13   Teressa Lower, MD  ferrous sulfate 325 (65 FE) MG tablet Take 325 mg by mouth daily with breakfast.    Historical Provider, MD  HYDROcodone-homatropine  (HYCODAN) 5-1.5 MG/5ML syrup Take 5 mLs by mouth every 6 (six) hours as needed for cough. 09/27/16   Robyn Haber, MD  ibuprofen (ADVIL,MOTRIN) 200 MG tablet Take 400 mg by mouth every 6 (six) hours as needed for pain.    Historical Provider, MD  predniSONE (DELTASONE) 20 MG tablet Two daily with food 09/27/16   Robyn Haber, MD    Family History History reviewed. No pertinent family history.  Social History Social History  Substance Use Topics  . Smoking status: Current Every Day Smoker    Packs/day: 1.00    Types: Cigarettes  . Smokeless tobacco: Never Used  . Alcohol use 0.6 oz/week    1 Glasses of wine per week     Comment: rarely     Allergies   Patient has no known allergies.   Review of Systems Review of Systems  Constitutional: Positive for chills and fatigue.  HENT: Positive for congestion.   Respiratory: Positive for cough, shortness of breath and wheezing.   Cardiovascular: Negative.   Neurological: Negative.      Physical Exam Triage Vital Signs ED Triage Vitals [09/27/16 1602]  Enc Vitals Group     BP 127/86     Pulse Rate 115     Resp 18     Temp 99.8 F (37.7  C)     Temp Source Oral     SpO2 95 %     Weight      Height      Head Circumference      Peak Flow      Pain Score      Pain Loc      Pain Edu?      Excl. in [place]?    No data found.   Updated Vital Signs BP 127/86 (BP Location: Left Arm)   Pulse 115   Temp 99.8 F (37.7 C) (Oral)   Resp 18   SpO2 95%    Physical Exam  Constitutional: She is oriented to person, place, and time. She appears well-developed and well-nourished.  HENT:  Head: Normocephalic.  Right Ear: External ear normal.  Left Ear: External ear normal.  Mouth/Throat: Oropharynx is clear and moist.  Eyes: Conjunctivae and EOM are normal.  Neck: Normal range of motion. Neck supple.  Pulmonary/Chest: Effort normal. She has wheezes.  Musculoskeletal: Normal range of motion.  Neurological: She is alert and  oriented to person, place, and time.  Skin: Skin is warm and dry.  Nursing note and vitals reviewed.    UC Treatments / Results  Labs (all labs ordered are listed, but only abnormal results are displayed) Labs Reviewed - No data to display  EKG  EKG Interpretation None       Radiology No results found.  Procedures Procedures (including critical care time)  Medications Ordered in UC Medications - No data to display   Initial Impression / Assessment and Plan / UC Course  I have reviewed the triage vital signs and the nursing notes.  Pertinent labs & imaging results that were available during my care of the patient were reviewed by me and considered in my medical decision making (see chart for details).  Clinical Course     Final Clinical Impressions(s) / UC Diagnoses   Final diagnoses:  Bronchitis  Essential hypertension    New Prescriptions New Prescriptions   ALBUTEROL SULFATE (PROAIR RESPICLICK) 123XX123 (90 BASE) MCG/ACT AEPB    Inhale 2 puffs into the lungs every 4 (four) hours as needed.   AMLODIPINE (NORVASC) 5 MG TABLET    Take 1 tablet (5 mg total) by mouth daily.   AZITHROMYCIN (ZITHROMAX) 250 MG TABLET    Take 1 tablet (250 mg total) by mouth daily. Take first 2 tablets together, then 1 every day until finished.   HYDROCODONE-HOMATROPINE (HYCODAN) 5-1.5 MG/5ML SYRUP    Take 5 mLs by mouth every 6 (six) hours as needed for cough.   PREDNISONE (DELTASONE) 20 MG TABLET    Two daily with food     Robyn Haber, MD 09/27/16 1623

## 2016-09-27 NOTE — ED Triage Notes (Signed)
Pt here for cold like symptoms. sts that she got sick from her grand baby. sts started last Friday. sts that she hasnt had her BP meds in 7 days and scared to take any medications for cold.

## 2017-01-16 ENCOUNTER — Observation Stay (HOSPITAL_COMMUNITY)
Admission: EM | Admit: 2017-01-16 | Discharge: 2017-01-17 | Disposition: A | Discharge status: Home / Self Care | Payer: Non-veteran care | Attending: Internal Medicine | Admitting: Internal Medicine

## 2017-01-16 ENCOUNTER — Encounter (HOSPITAL_COMMUNITY): Payer: Self-pay

## 2017-01-16 ENCOUNTER — Emergency Department (HOSPITAL_COMMUNITY): Payer: Non-veteran care

## 2017-01-16 DIAGNOSIS — R079 Chest pain, unspecified: Secondary | ICD-10-CM

## 2017-01-16 DIAGNOSIS — F17210 Nicotine dependence, cigarettes, uncomplicated: Secondary | ICD-10-CM | POA: Insufficient documentation

## 2017-01-16 DIAGNOSIS — R071 Chest pain on breathing: Principal | ICD-10-CM | POA: Insufficient documentation

## 2017-01-16 DIAGNOSIS — Z7982 Long term (current) use of aspirin: Secondary | ICD-10-CM | POA: Diagnosis not present

## 2017-01-16 DIAGNOSIS — I4892 Unspecified atrial flutter: Secondary | ICD-10-CM | POA: Diagnosis not present

## 2017-01-16 DIAGNOSIS — Z79899 Other long term (current) drug therapy: Secondary | ICD-10-CM | POA: Diagnosis not present

## 2017-01-16 DIAGNOSIS — I10 Essential (primary) hypertension: Secondary | ICD-10-CM | POA: Diagnosis not present

## 2017-01-16 DIAGNOSIS — C9201 Acute myeloblastic leukemia, in remission: Secondary | ICD-10-CM | POA: Diagnosis not present

## 2017-01-16 DIAGNOSIS — R0600 Dyspnea, unspecified: Secondary | ICD-10-CM | POA: Insufficient documentation

## 2017-01-16 DIAGNOSIS — R0789 Other chest pain: Secondary | ICD-10-CM

## 2017-01-16 DIAGNOSIS — Z91040 Latex allergy status: Secondary | ICD-10-CM | POA: Diagnosis not present

## 2017-01-16 DIAGNOSIS — Z8679 Personal history of other diseases of the circulatory system: Secondary | ICD-10-CM

## 2017-01-16 HISTORY — DX: Acute myeloblastic leukemia, not having achieved remission: C92.00

## 2017-01-16 LAB — BASIC METABOLIC PANEL
Anion gap: 10 (ref 5–15)
BUN: 11 mg/dL (ref 6–20)
CHLORIDE: 104 mmol/L (ref 101–111)
CO2: 24 mmol/L (ref 22–32)
Calcium: 9.2 mg/dL (ref 8.9–10.3)
Creatinine, Ser: 0.92 mg/dL (ref 0.44–1.00)
GFR calc Af Amer: >60 mL/min (ref >60)
GFR calc non Af Amer: >60 mL/min (ref >60)
GLUCOSE: 80 mg/dL (ref 65–99)
POTASSIUM: 3.8 mmol/L (ref 3.5–5.1)
Sodium: 138 mmol/L (ref 135–145)

## 2017-01-16 LAB — CBC
HEMATOCRIT: 45.1 % (ref 36.0–46.0)
Hemoglobin: 15.7 g/dL — ABNORMAL HIGH (ref 12.0–15.0)
MCH: 31.8 pg (ref 26.0–34.0)
MCHC: 34.8 g/dL (ref 30.0–36.0)
MCV: 91.3 fL (ref 78.0–100.0)
Platelets: 286 10*3/uL (ref 150–400)
RBC: 4.94 MIL/uL (ref 3.87–5.11)
RDW: 13.5 % (ref 11.5–15.5)
WBC: 9.4 10*3/uL (ref 4.0–10.5)

## 2017-01-16 LAB — I-STAT TROPONIN, ED
Troponin i, poc: 0 ng/mL (ref 0.00–0.08)
Troponin i, poc: 0 ng/mL (ref 0.00–0.08)

## 2017-01-16 LAB — TROPONIN I: Troponin I: <0.03 ng/mL (ref <0.03)

## 2017-01-16 MED ORDER — FENTANYL CITRATE (PF) 100 MCG/2ML IJ SOLN
50.0000 ug | Freq: Once | INTRAMUSCULAR | Status: AC
  Administered 2017-01-16: 50 ug via INTRAVENOUS
  Filled 2017-01-16: qty 2

## 2017-01-16 MED ORDER — CYCLOBENZAPRINE HCL 10 MG PO TABS: 10.0000 mg | ORAL_TABLET | Freq: Every day | ORAL | Status: DC | PRN

## 2017-01-16 MED ORDER — VITAMIN D 1000 UNITS PO TABS
1000.0000 [IU] | ORAL_TABLET | Freq: Every day | ORAL | Status: DC
  Administered 2017-01-17: 1000 [IU] via ORAL
  Filled 2017-01-16 (×2): qty 1

## 2017-01-16 MED ORDER — ONDANSETRON HCL 4 MG/2ML IJ SOLN: 4.0000 mg | Freq: Four times a day (QID) | INTRAMUSCULAR | Status: DC | PRN

## 2017-01-16 MED ORDER — AMLODIPINE BESYLATE 5 MG PO TABS
5.0000 mg | ORAL_TABLET | Freq: Every day | ORAL | Status: DC
Start: 2017-01-17 — End: 2017-01-17
  Administered 2017-01-17: 5 mg via ORAL
  Filled 2017-01-16: qty 1

## 2017-01-16 MED ORDER — IOPAMIDOL (ISOVUE-370) INJECTION 76%
INTRAVENOUS | Status: AC
  Administered 2017-01-16: 60 mL via INTRAVENOUS
  Filled 2017-01-16: qty 100

## 2017-01-16 MED ORDER — POTASSIUM CHLORIDE CRYS ER 20 MEQ PO TBCR
20.0000 meq | EXTENDED_RELEASE_TABLET | Freq: Every day | ORAL | Status: DC
  Administered 2017-01-17: 20 meq via ORAL
  Filled 2017-01-16 (×2): qty 1

## 2017-01-16 MED ORDER — IBUPROFEN 200 MG PO TABS: 400.0000 mg | ORAL_TABLET | Freq: Four times a day (QID) | ORAL | Status: DC | PRN

## 2017-01-16 MED ORDER — ACETAMINOPHEN 500 MG PO TABS: 500.0000 mg | ORAL_TABLET | Freq: Four times a day (QID) | ORAL | Status: DC | PRN

## 2017-01-16 MED ORDER — ASPIRIN EC 81 MG PO TBEC
81.0000 mg | DELAYED_RELEASE_TABLET | Freq: Every day | ORAL | Status: DC
  Administered 2017-01-17: 81 mg via ORAL
  Filled 2017-01-16: qty 1

## 2017-01-16 MED ORDER — GI COCKTAIL ~~LOC~~: 30.0000 mL | Freq: Four times a day (QID) | ORAL | Status: DC | PRN

## 2017-01-16 MED ORDER — ALBUTEROL SULFATE (2.5 MG/3ML) 0.083% IN NEBU: 2.5000 mg | INHALATION_SOLUTION | RESPIRATORY_TRACT | Status: DC | PRN

## 2017-01-16 MED ORDER — PREDNISONE 20 MG PO TABS: 20.0000 mg | ORAL_TABLET | Freq: Every day | ORAL | Status: DC

## 2017-01-16 MED ORDER — ENOXAPARIN SODIUM 40 MG/0.4ML ~~LOC~~ SOLN
40.0000 mg | SUBCUTANEOUS | Status: DC
  Administered 2017-01-16: 40 mg via SUBCUTANEOUS
  Filled 2017-01-16: qty 0.4

## 2017-01-16 MED ORDER — ACETAMINOPHEN 325 MG PO TABS: 650.0000 mg | ORAL_TABLET | ORAL | Status: DC | PRN

## 2017-01-16 NOTE — ED Notes (Signed)
Patient transported to CT 

## 2017-01-16 NOTE — ED Notes (Signed)
Pt returns from ct.,

## 2017-01-16 NOTE — ED Provider Notes (Signed)
[facility] DEPT Provider Note   CSN: [id number] Arrival date & time: 01/16/17  1215     History   Chief Complaint Chief Complaint  Patient presents with  . Chest Pain    HPI Jessica Vega is a 56 y.o. female with history of AML with bone marrow transplant in 2016, hypertension who presents with sudden onset left-sided chest pain that began while getting dressed today. Patient describes her pain as a sharp left-sided chest pain with radiation to her left neck. Patient's pain was worse with inspiration. Patient took four 81 mg aspirin with good relief of symptoms, the patient still has a 7/10 pressure. Patient did not report shortness of breath, however she was afraid to take a deep breath due to aching her pain worse. Patient denies any recent long trips or recent surgeries, new leg pain or swelling. Patient believes she has a history of blood clot many years ago. Patient has a family history of heart disease diagnosed prior to 19.   Chest Pain   Associated symptoms include shortness of breath. Pertinent negatives include no abdominal pain, no back pain, no fever, no headaches, no nausea and no vomiting.    Past Medical History:  Diagnosis Date  . Anemia   . Cancer ([place])   . Hypertension     Patient Active Problem List   Diagnosis Date Noted  . Chest pain 01/16/2017  . Dyspnea 01/16/2017  . Acute myeloid leukemia in remission ([place]) 01/16/2017  . History of pericarditis 01/16/2017  . HTN (hypertension) 01/16/2017  . Other pancytopenia ([place]) 04/30/2013    Past Surgical History:  Procedure Laterality Date  . ABDOMINAL HYSTERECTOMY    . CHOLECYSTECTOMY      OB History    No data available       Home Medications    Prior to Admission medications   Medication Sig Start Date End Date Taking? Authorizing Provider  acetaminophen (TYLENOL) 500 MG tablet Take 500 mg by mouth every 6 (six) hours as needed.   Yes Historical Provider, MD  Albuterol Sulfate (PROAIR  RESPICLICK) 825 (90 Base) MCG/ACT AEPB Inhale 2 puffs into the lungs every 4 (four) hours as needed. 09/27/16  Yes Robyn Haber, MD  amLODipine (NORVASC) 5 MG tablet Take 1 tablet (5 mg total) by mouth daily. 09/27/16  Yes Robyn Haber, MD  aspirin 81 MG tablet Take 81 mg by mouth daily.   Yes Historical Provider, MD  cholecalciferol (VITAMIN D) 1000 UNITS tablet Take 1,000 Units by mouth daily.   Yes Historical Provider, MD  cyclobenzaprine (FLEXERIL) 10 MG tablet Take 10 mg by mouth daily as needed for muscle spasms.   Yes Historical Provider, MD  ibuprofen (ADVIL,MOTRIN) 200 MG tablet Take 400 mg by mouth every 6 (six) hours as needed for pain.   Yes Historical Provider, MD  potassium chloride SA (K-DUR,KLOR-CON) 20 MEQ tablet Take 20 mEq by mouth daily.   Yes Historical Provider, MD  predniSONE (DELTASONE) 20 MG tablet Two daily with food 09/27/16  Yes Robyn Haber, MD  azithromycin (ZITHROMAX) 250 MG tablet Take 1 tablet (250 mg total) by mouth daily. Take first 2 tablets together, then 1 every day until finished. Patient not taking: Reported on 01/16/2017 09/27/16   Robyn Haber, MD  famotidine (PEPCID) 20 MG tablet Take 1 tablet (20 mg total) by mouth 2 (two) times daily. Patient not taking: Reported on 01/16/2017 04/30/13   Teressa Lower, MD  ferrous sulfate 325 (65 FE) MG tablet Take 325 mg  by mouth daily with breakfast.    Historical Provider, MD  HYDROcodone-homatropine (HYCODAN) 5-1.5 MG/5ML syrup Take 5 mLs by mouth every 6 (six) hours as needed for cough. Patient not taking: Reported on 01/16/2017 09/27/16   Robyn Haber, MD    Family History History reviewed. No pertinent family history.  Social History Social History  Substance Use Topics  . Smoking status: Current Every Day Smoker    Packs/day: 1.00    Types: Cigarettes  . Smokeless tobacco: Never Used  . Alcohol use 0.6 oz/week    1 Glasses of wine per week     Comment: rarely     Allergies     Chlorhexidine; Other; and Latex   Review of Systems Review of Systems  Constitutional: Negative for chills and fever.  HENT: Negative for facial swelling and sore throat.   Respiratory: Positive for shortness of breath.   Cardiovascular: Positive for chest pain. Negative for leg swelling.  Gastrointestinal: Negative for abdominal pain, nausea and vomiting.  Genitourinary: Negative for dysuria.  Musculoskeletal: Positive for neck pain (L sided anterior, now resolved). Negative for back pain.  Skin: Negative for rash and wound.  Neurological: Negative for headaches.  Psychiatric/Behavioral: The patient is not nervous/anxious.      Physical Exam Updated Vital Signs BP (!) 119/107   Pulse 78   Temp 98 F (36.7 C) (Oral)   Resp (!) 23   Ht 5\' 4"  (1.626 m)   Wt 72.6 kg   SpO2 98%   BMI 27.46 kg/m   Physical Exam  Constitutional: She appears well-developed and well-nourished. No distress.  HENT:  Head: Normocephalic and atraumatic.  Mouth/Throat: Oropharynx is clear and moist. No oropharyngeal exudate.  Eyes: Conjunctivae are normal. Pupils are equal, round, and reactive to light. Right eye exhibits no discharge. Left eye exhibits no discharge. No scleral icterus.  Neck: Normal range of motion. Neck supple. No thyromegaly present.  Cardiovascular: Normal rate, normal heart sounds and intact distal pulses.  Exam reveals no gallop and no friction rub.   No murmur heard. Pulmonary/Chest: Effort normal and breath sounds normal. No stridor. No respiratory distress. She has no wheezes. She has no rales. She exhibits tenderness (L sided soreness).  Abdominal: Soft. Bowel sounds are normal. She exhibits no distension. There is no tenderness. There is no rebound and no guarding.  Musculoskeletal: She exhibits no edema.  Lymphadenopathy:    She has no cervical adenopathy.  Neurological: She is alert. Coordination normal.  Skin: Skin is warm and dry. No rash noted. She is not  diaphoretic. No pallor.  Psychiatric: She has a normal mood and affect.  Nursing note and vitals reviewed.    ED Treatments / Results  Labs (all labs ordered are listed, but only abnormal results are displayed) Labs Reviewed  CBC - Abnormal; Notable for the following:       Result Value   Hemoglobin 15.7 (*)    All other components within normal limits  BASIC METABOLIC PANEL  I-STAT TROPOININ, ED  I-STAT TROPOININ, ED    EKG  EKG Interpretation  Date/Time:  Monday January 16 2017 12:32:39 EDT Ventricular Rate:  83 PR Interval:    QRS Duration: 98 QT Interval:  392 QTC Calculation: 461 R Axis:   -35 Text Interpretation:  Sinus rhythm Left axis deviation Low voltage, precordial leads RSR' in V1 or V2, right VCD or RVH Since last EKG, rate has increased Otherwise no significant change Confirmed by Ellender Hose MD, [place] [phone]) on 01/16/2017  2:29:17 PM       Radiology Ct Angio Chest Pe W And/or Wo Contrast  Result Date: 01/16/2017 CLINICAL DATA:  Pleuritic chest pain and short of breath. History of AML and bone marrow transplant EXAM: CT ANGIOGRAPHY CHEST WITH CONTRAST TECHNIQUE: Multidetector CT imaging of the chest was performed using the standard protocol during bolus administration of intravenous contrast. Multiplanar CT image reconstructions and MIPs were obtained to evaluate the vascular anatomy. CONTRAST:  60 mL Isovue 370 IV COMPARISON:  Chest two-view 04/30/2013 FINDINGS: Cardiovascular: Image quality degraded by patient most the motion. This degree of motion could obscure small pulmonary emboli Negative for pulmonary emboli. Pulmonary arteries normal in caliber. Negative for aortic aneurysm. Heart size within normal limits. Negative for pericardial effusion. Mediastinum/Nodes: Negative Lungs/Pleura: Right middle lobe atelectasis. Right lower lobe atelectasis. Negative for pneumonia or effusion. Negative for mass lesion. Upper Abdomen: Cholecystectomy. No acute abnormality in the  upper abdomen. Musculoskeletal: Multiple Mild compression fractures in the thoracic spine most likely benign and chronic. Review of the MIP images confirms the above findings. IMPRESSION: Negative for pulmonary emboli.  No acute abnormality in the chest Right middle lobe and right lower lobe atelectasis. Image quality degraded by motion. Electronically Signed   By: Franchot Gallo M.D.   On: 01/16/2017 15:18    Procedures Procedures (including critical care time)  Medications Ordered in ED Medications  fentaNYL (SUBLIMAZE) injection 50 mcg (50 mcg Intravenous Given 01/16/17 1355)  iopamidol (ISOVUE-370) 76 % injection (60 mLs Intravenous Contrast Given 01/16/17 1509)     Initial Impression / Assessment and Plan / ED Course  I have reviewed the triage vital signs and the nursing notes.  Pertinent labs & imaging results that were available during my care of the patient were reviewed by me and considered in my medical decision making (see chart for details).     HEART score 4. CBC shows hemoglobin 15.7. BMP unremarkable. Initial troponin negative. Delta troponin pending. CT angio chest is negative for pulmonary emboli or other acute abnormality in the chest; right middle lobe and right lower lobe atelectasis. EKG shows NSR, left axis deviation. Pain/pressure controlled with fentanyl in the ED. Considering concerning history for ACS and heart score 4, myself and Dr. Joellen Jersey patient should be admitted for cardiac rule out. I consulted hospitalist PA [facility] who will admit the patient for further evaluation and treatment.   Final Clinical Impressions(s) / ED Diagnoses   Final diagnoses:  Nonspecific chest pain    New Prescriptions New Prescriptions   No medications on file     Frederica Kuster, PA-C 01/16/17 1625    Duffy Bruce, MD 01/16/17 [phone]

## 2017-01-16 NOTE — ED Notes (Signed)
Attempted to call report

## 2017-01-16 NOTE — ED Notes (Signed)
Pt c/o chest pressure 4/10.

## 2017-01-16 NOTE — ED Notes (Signed)
Pt hooked back up to monitor, blood pressure cycled. Blue non skid socks placed on pts feet. Family at bedside.

## 2017-01-16 NOTE — H&P (Signed)
History and Physical    Jessica Vega PPJ:[id number] DOB: [birth year] DOA: 01/16/2017  PCP: Tennis Must, MD   Patient coming from: home  Chief Complaint: chest pain and SOB since this am  HPI: Jessica Vega is a 56 y.o. female with medical history significant of hypertension, anemia, acute myeloid leukemia in remission, status post allogenic bone marrow transplant complicated by aspergillosis pneumonia and cytomegalovirus viremia, neutropenic fever treated with broad-spectrum antibiotics and complicated by C. difficile colitis, a right-sided pleural effusion presented ot the ED with c/o chest a[pin She is a patient of [facility] in [place] and for myeloid leukemia chemotherapy and of other multiple medical problems she is being followed at the [facility].   She developed acute onset chest pain radiating to the left neck this morning while preparing to go to work. Patient reported that pain started at the left side of the neck leading down to the left chest and stayed there.   ED Course: Upon arrival to the ED patient had stable vital signs, blood work was unremarkable except elevated hemoglobin and 15.7 g/dL Troponin was 0.00 Chest CT dated the right mid and lower lobe atelectasis with multiple compression fractures of the thoracic spine, most likely benign  EKG demonstrated sinus rhythm with right bundle branch block type abduction delay, left axis deviation Upon physical examination left-sided chest pain is reproducible the patient reports tenderness to palpation on the left and no tenderness or pain on the right side of the anterior chest. Review of Systems: As per HPI otherwise 10 point review of systems negative.   Ambulatory Status: Independent  Past Medical History:  Diagnosis Date  . Anemia   . Cancer ([place])   . Hypertension     Past Surgical History:  Procedure Laterality Date  . ABDOMINAL HYSTERECTOMY    . CHOLECYSTECTOMY       Social History   Social History  . Marital status: Divorced    Spouse name: N/A  . Number of children: N/A  . Years of education: N/A   Occupational History  . Not on file.   Social History Main Topics  . Smoking status: Current Every Day Smoker    Packs/day: 1.00    Types: Cigarettes  . Smokeless tobacco: Never Used  . Alcohol use 0.6 oz/week    1 Glasses of wine per week     Comment: rarely  . Drug use: No  . Sexual activity: Yes   Other Topics Concern  . Not on file   Social History Narrative  . No narrative on file    Allergies  Allergen Reactions  . Chlorhexidine Rash  . Other Itching    Transparent dressing causes itching per pt  . Latex Rash    History reviewed. No pertinent family history.  Prior to Admission medications   Medication Sig Start Date End Date Taking? Authorizing Provider  acetaminophen (TYLENOL) 500 MG tablet Take 500 mg by mouth every 6 (six) hours as needed.   Yes Historical Provider, MD  Albuterol Sulfate (PROAIR RESPICLICK) 580 (90 Base) MCG/ACT AEPB Inhale 2 puffs into the lungs every 4 (four) hours as needed. 09/27/16  Yes Robyn Haber, MD  amLODipine (NORVASC) 5 MG tablet Take 1 tablet (5 mg total) by mouth daily. 09/27/16  Yes Robyn Haber, MD  aspirin 81 MG tablet Take 81 mg by mouth daily.   Yes Historical Provider, MD  cholecalciferol (VITAMIN D) 1000 UNITS tablet Take 1,000 Units by mouth daily.  Yes Historical Provider, MD  cyclobenzaprine (FLEXERIL) 10 MG tablet Take 10 mg by mouth daily as needed for muscle spasms.   Yes Historical Provider, MD  ibuprofen (ADVIL,MOTRIN) 200 MG tablet Take 400 mg by mouth every 6 (six) hours as needed for pain.   Yes Historical Provider, MD  potassium chloride SA (K-DUR,KLOR-CON) 20 MEQ tablet Take 20 mEq by mouth daily.   Yes Historical Provider, MD  predniSONE (DELTASONE) 20 MG tablet Two daily with food 09/27/16  Yes Robyn Haber, MD  azithromycin (ZITHROMAX) 250 MG tablet Take  1 tablet (250 mg total) by mouth daily. Take first 2 tablets together, then 1 every day until finished. Patient not taking: Reported on 01/16/2017 09/27/16   Robyn Haber, MD  famotidine (PEPCID) 20 MG tablet Take 1 tablet (20 mg total) by mouth 2 (two) times daily. Patient not taking: Reported on 01/16/2017 04/30/13   Teressa Lower, MD  ferrous sulfate 325 (65 FE) MG tablet Take 325 mg by mouth daily with breakfast.    Historical Provider, MD  HYDROcodone-homatropine (HYCODAN) 5-1.5 MG/5ML syrup Take 5 mLs by mouth every 6 (six) hours as needed for cough. Patient not taking: Reported on 01/16/2017 09/27/16   Robyn Haber, MD    Physical Exam: Vitals:   01/16/17 1400 01/16/17 1415 01/16/17 1430 01/16/17 1515  BP: 113/80 117/71 113/85 105/68  Pulse: 74 70 73 76  Resp: 19 19 (!) 22 (!) 23  Temp:      TempSrc:      SpO2: 98% 100% 100% 99%  Weight:      Height:         General: Appears calm and comfortable Eyes: PERRLA, EOMI, normal lids, iris ENT:  grossly normal hearing, lips & tongue, mucous membranes moist and intact Neck: no lymphoadenopathy, masses or thyromegaly Cardiovascular: RRR, no m/r/g. No JVD, carotid bruits. No LE edema.  Respiratory: bilateral no wheezes, rales, rhonchi or cracles. Breath sound unaudible in the lower 1/2 of the right lung. Normal respiratory effort. No accessory muscle use observed Abdomen: soft, non-tender, non-distended, no organomegaly or masses appreciated. BS present in all quadrants Skin: no rash, ulcers or induration seen on limited exam Musculoskeletal: grossly normal tone BUE/BLE, good ROM, no bony abnormality or joint deformities observed Psychiatric: grossly normal mood and affect, speech fluent and appropriate, alert and oriented x3 Neurologic: CN II-XII grossly intact, moves all extremities in coordinated fashion, sensation intact  Labs on Admission: I have personally reviewed following labs and imaging studies  CBC,  BMP  GFR: Estimated Creatinine Clearance: 67.5 mL/min (by C-G formula based on SCr of 0.92 mg/dL).   Creatinine Clearance: Estimated Creatinine Clearance: 67.5 mL/min (by C-G formula based on SCr of 0.92 mg/dL).    Radiological Exams on Admission: Ct Angio Chest Pe W And/or Wo Contrast  Result Date: 01/16/2017 CLINICAL DATA:  Pleuritic chest pain and short of breath. History of AML and bone marrow transplant EXAM: CT ANGIOGRAPHY CHEST WITH CONTRAST TECHNIQUE: Multidetector CT imaging of the chest was performed using the standard protocol during bolus administration of intravenous contrast. Multiplanar CT image reconstructions and MIPs were obtained to evaluate the vascular anatomy. CONTRAST:  60 mL Isovue 370 IV COMPARISON:  Chest two-view 04/30/2013 FINDINGS: Cardiovascular: Image quality degraded by patient most the motion. This degree of motion could obscure small pulmonary emboli Negative for pulmonary emboli. Pulmonary arteries normal in caliber. Negative for aortic aneurysm. Heart size within normal limits. Negative for pericardial effusion. Mediastinum/Nodes: Negative Lungs/Pleura: Right middle lobe atelectasis. Right  lower lobe atelectasis. Negative for pneumonia or effusion. Negative for mass lesion. Upper Abdomen: Cholecystectomy. No acute abnormality in the upper abdomen. Musculoskeletal: Multiple Mild compression fractures in the thoracic spine most likely benign and chronic. Review of the MIP images confirms the above findings. IMPRESSION: Negative for pulmonary emboli.  No acute abnormality in the chest Right middle lobe and right lower lobe atelectasis. Image quality degraded by motion. Electronically Signed   By: Franchot Gallo M.D.   On: 01/16/2017 15:18    EKG: Independently reviewed - sinus rhythm with right bundle branch block type abduction delay, left axis deviation  Assessment/Plan Principal Problem:   Chest pain Active Problems:   Dyspnea   Acute myeloid leukemia in  remission (HCC)   History of pericarditis   HTN (hypertension)    Chest pain - most likely musculoskeletal associated with costochondritis Patient will be admitted and placed in observation, continue cycling troponin and monitor on telemetry for 24 hours. Add opioid analgesic if needed  Dyspnea - her chest CT showed significant elevation of the right hemidiaphragm, which could be chronic Patient reports having dyspnea with excitement, if she loughs or speaks fast and loud Will order sent to spirometry and I'll ask PT to evaluate patient May benefit from follow-up in [facility] for evaluation of right sided hemiduaphragm elevation t/o out phrenic nerve involvement if symptoms persist.  Hypertension - continue current medications and adjust doses if needed  Acute myelogenic leukemia - in remission   DVT prophylaxis: Lovenox Code Status: Full Family Communication: none Disposition Plan: telemetry Consults called: none Admission status: observation   York Grice, [place] Pager: [phone] [facility]  If 7PM-7AM, please contact night-coverage [email or web address] Password [email or web address]  01/16/2017, 4:00 PM

## 2017-01-16 NOTE — ED Triage Notes (Signed)
Pt arrives EMS with c/o chest pain while dressing fort work this am. Radiates to left neck, 10/10. Took 4 baby aspirin prior to EMS arrival and chest pain resolved.

## 2017-01-17 ENCOUNTER — Encounter (HOSPITAL_COMMUNITY): Payer: Self-pay | Admitting: Student

## 2017-01-17 DIAGNOSIS — R072 Precordial pain: Secondary | ICD-10-CM | POA: Diagnosis not present

## 2017-01-17 DIAGNOSIS — I10 Essential (primary) hypertension: Secondary | ICD-10-CM

## 2017-01-17 DIAGNOSIS — R071 Chest pain on breathing: Secondary | ICD-10-CM | POA: Diagnosis not present

## 2017-01-17 LAB — TROPONIN I

## 2017-01-17 LAB — HIV ANTIBODY (ROUTINE TESTING W REFLEX): HIV Screen 4th Generation wRfx: NONREACTIVE

## 2017-01-17 NOTE — Progress Notes (Signed)
Discharge instructions reviewed with pt. Pt has no questions at this time. Pt stated that she has a follow up appointment scheduled. Spoke with Dr. Eliseo Squires and updated pt that she should be contacted to set up an out pt functional study and if she dose not hear anything with in the week to call her PCP. Pt denied any pain at this time. Iv d.c. Pt discharged home with daughter.

## 2017-01-17 NOTE — Consult Note (Addendum)
Cardiology Consult    Patient ID: Jessica Vega MRN: [id number], DOB/AGE: 06/30/1961   Admit date: 01/16/2017 Date of Consult: 01/17/2017  Primary Physician: Tennis Must, MD Reason for Consult: Chest Pain Primary Cardiologist: New to [facility] - Dr. Stanford Breed; Followed by the [facility].  Requesting Provider: Dr. Eliseo Squires   History of Present Illness    Jessica Vega is a 56 y.o. female with past medical history of AML (s/p bone marrow transplant in 1324 complicated by CMV, colitis, and sepsis), HTN, history of pericarditis (occurring after her transplant), and tobacco use who is being seen today for the evaluation of chest pain at the request of Dr. Eliseo Squires.  She presented to [facility] ED on 01/16/2017 for evaluation of chest pain. She reports being in her usual state of health until that morning when she was getting ready for work and developed a pressure along her left-pectoral region which radiated into her neck. Denies any associated dyspnea, nausea, vomiting, or diaphoresis. The pain lasted for 8+ hours then gradually resolved. She reports being very anxious prior to going to work due to her stressful work environment. She walks over 2 miles per day to her job and denies any associated chest pain or dyspnea with this. She works for 2-3 hours as a time due to fatigue secondary to her AML and subsequent transplant in 2016.  She is unaware of any personal history of known CAD or prior MI's.  Reports her mother  Has a history of prior CVA and father had ESRD secondary to uncontrolled Type 2 DM. No known history of CAD in her parents or siblings.   Initial labs show WBC of 9.4, Hgb 15.7, platelets 286. K+ 3.8. Creatinine 0.92. Initial and cyclic troponin values have been negative. CTA negative for PE with right middle lobe and lower lobe atelectasis noted. EKG shows NSR, HR 83, with low-voltage along the precordial leads.   She reports feeling significantly improved this morning. Still has a  slight "tightness" along her left pectoral region, saying it feels like she pulled a muscle. Pain is worse with turning from side-to-side and with palpation.    Past Medical History   Past Medical History:  Diagnosis Date  . AML (acute myeloblastic leukemia) ([place])    s/p bone marrow transplant in 4010 complicated by CMV, colitis, and sepsis  . Anemia   . Hypertension     Past Surgical History:  Procedure Laterality Date  . ABDOMINAL HYSTERECTOMY    . CHOLECYSTECTOMY       Allergies  Allergies  Allergen Reactions  . Chlorhexidine Rash  . Other Itching    Transparent dressing causes itching per pt  . Latex Rash    Inpatient Medications    . amLODipine  5 mg Oral Daily  . aspirin EC  81 mg Oral Daily  . cholecalciferol  1,000 Units Oral Daily  . enoxaparin (LOVENOX) injection  40 mg Subcutaneous Q24H  . potassium chloride SA  20 mEq Oral Daily    Family History    Family History  Problem Relation Age of Onset  . CVA Mother   . Diabetes Mellitus II Father   . Renal Disease Father     Social History    Social History   Social History  . Marital status: Divorced    Spouse name: N/A  . Number of children: N/A  . Years of education: N/A   Occupational History  . Not on file.   Social History Main Topics  .  Smoking status: Current Every Day Smoker    Packs/day: 1.00    Types: Cigarettes  . Smokeless tobacco: Never Used  . Alcohol use 0.6 oz/week    1 Glasses of wine per week     Comment: rarely  . Drug use: No  . Sexual activity: Yes   Other Topics Concern  . Not on file   Social History Narrative  . No narrative on file     Review of Systems    General:  No chills, fever, night sweats or weight changes. Positive for fatigue.  Cardiovascular:  No dyspnea on exertion, edema, orthopnea, palpitations, paroxysmal nocturnal dyspnea. Positive for chest pain.  Dermatological: No rash, lesions/masses Respiratory: No cough, dyspnea Urologic: No  hematuria, dysuria Abdominal:   No nausea, vomiting, diarrhea, bright red blood per rectum, melena, or hematemesis Neurologic:  No visual changes, wkns, changes in mental status. All other systems reviewed and are otherwise negative except as noted above.  Physical Exam    Blood pressure 117/70, pulse 73, temperature 98.1 F (36.7 C), temperature source Oral, resp. rate 20, height 5\' 2"  (1.575 m), weight 166 lb 4.8 oz (75.4 kg), SpO2 97 %.  General: Pleasant, African American female appearing in NAD Psych: Normal affect. Neuro: Alert and oriented X 3. Moves all extremities spontaneously. HEENT: Normal  Neck: Supple without bruits or JVD. Lungs:  Resp regular and unlabored, CTA without wheezing or rales. Heart: RRR no s3, s4, or murmurs. Tender to palpation along left pectoral region.  Abdomen: Soft, non-tender, non-distended, BS + x 4.  Extremities: No clubbing, cyanosis or edema. DP/PT/Radials 2+ and equal bilaterally.  Labs    Troponin [facility] Test)  Recent Labs  01/16/17 1654  TROPIPOC 0.00    Recent Labs  01/16/17 1845 01/16/17 2246 01/17/17 0041  TROPONINI <0.03 <0.03 <0.03   Lab Results  Component Value Date   WBC 9.4 01/16/2017   HGB 15.7 (H) 01/16/2017   HCT 45.1 01/16/2017   MCV 91.3 01/16/2017   PLT 286 01/16/2017     Recent Labs Lab 01/16/17 1236  NA 138  K 3.8  CL 104  CO2 24  BUN 11  CREATININE 0.92  CALCIUM 9.2  GLUCOSE 80   No results found for: CHOL, HDL, LDLCALC, TRIG No results found for: [facility]   Radiology Studies    Ct Angio Chest Pe W And/or Wo Contrast  Result Date: 01/16/2017 CLINICAL DATA:  Pleuritic chest pain and short of breath. History of AML and bone marrow transplant EXAM: CT ANGIOGRAPHY CHEST WITH CONTRAST TECHNIQUE: Multidetector CT imaging of the chest was performed using the standard protocol during bolus administration of intravenous contrast. Multiplanar CT image reconstructions and MIPs were obtained to  evaluate the vascular anatomy. CONTRAST:  60 mL Isovue 370 IV COMPARISON:  Chest two-view 04/30/2013 FINDINGS: Cardiovascular: Image quality degraded by patient most the motion. This degree of motion could obscure small pulmonary emboli Negative for pulmonary emboli. Pulmonary arteries normal in caliber. Negative for aortic aneurysm. Heart size within normal limits. Negative for pericardial effusion. Mediastinum/Nodes: Negative Lungs/Pleura: Right middle lobe atelectasis. Right lower lobe atelectasis. Negative for pneumonia or effusion. Negative for mass lesion. Upper Abdomen: Cholecystectomy. No acute abnormality in the upper abdomen. Musculoskeletal: Multiple Mild compression fractures in the thoracic spine most likely benign and chronic. Review of the MIP images confirms the above findings. IMPRESSION: Negative for pulmonary emboli.  No acute abnormality in the chest Right middle lobe and right lower lobe atelectasis. Image quality  degraded by motion. Electronically Signed   By: Franchot Gallo M.D.   On: 01/16/2017 15:18    EKG & Cardiac Imaging    EKG: NSR, HR 83, with low-voltage along the precordial leads.  - Personally Reviewed  Echocardiogram: 07/2014 (From [facility])  LEFT VENTRICLE The left ventricular size is normal. There is normal left ventricular wall  thickness. Left ventricular systolic function is mildly reduced. LV ejection  fraction = 45-50%. Left ventricular filling pattern is impaired. There is  mild global hypokinesis of the left ventricle.  RIGHT VENTRICLE The right ventricle is normal in size and function.  LEFT ATRIUM The left atrium is mildly dilated.  RIGHT ATRIUM  Right atrial size is normal. Injection of agitated saline showed no  right-to-left shunt.  AORTIC VALVE The aortic valve is normal in structure and function. No aortic regurgitation  is present.  MITRAL VALVE The mitral valve leaflets appear normal. There is mild mitral  regurgitation.  TRICUSPID VALVE Structurally normal tricuspid valve. There is trace tricuspid regurgitation.  PULMONIC VALVE The pulmonic valve is normal in structure and function. There is no pulmonic  valvular regurgitation.  ARTERIES The aortic root is normal size.  VENOUS Pulmonary veins were not well visualized during exam. IVC size was normal.  EFFUSION There is no pericardial effusion.  Assessment & Plan    1. Atypical Chest Pain - in her normal state of health until yesterday morning when she developed a pressure along her left-pectoral region which radiated into her neck, lasting for 8+ hours. No associated symptoms. Not worse with exertion or when in the prone position.  - she walks over 2 miles per day without experiencing any exertional symptoms per her report.  - no known history of HLD, Type 2 DM, or family history of CAD - initial and cyclic troponin values have been negative.  EKG shows NSR, HR 83, with low-voltage along the precordial leads.  - her pain is likely of MSK etiology as it is reproducible on palpation and worse with positional changes. Would not pursue further ischemic evaluation at this time.  Could consider GXT as an outpatient.   2. HTN - BP well-controlled this admission. - continue Amlodipine 10mg  daily.   3. Questionable Heart Rhythm - prior notes mention questionable episodes of atrial flutter while in the ED. When reviewing scanned CV Strips, p-waves are noted. Most consistent with SR.  - she has been in NSR since connected to telemetry on admission.   4. Tobacco Use - cessation advised.    Signed, Erma Heritage, PA-C 01/17/2017, 8:39 AM Pager: [phone] As above, patient seen and examined. Briefly she is a 56 year old female with past medical history of AML, hypertension, tobacco abuse who Dr. Eliseo Squires has asked [place] to see concerning chest pain. Patient typically does not have exertional dyspnea or exertional chest pain. Yesterday  morning she developed pain in her left chest radiating to her neck. It was initially described as a sharp pain for the first 30 minutes and then pressure for 6 hours following. It was continuous without completely resolving. Some increase with inspiration. No associated nausea, diaphoresis or dyspnea. Resolved spontaneously. Cardiology asked to evaluate.  CTA showed right middle lobe/right lower lobe atelectasis but no pulmonary embolus. Troponins are normal. Troponins normal; ECG-sinus, IRBBB, LVH, no ST changes  1 chest pain-symptoms are atypical and seems most consistent with musculoskeletal pain. Enzymes are negative. Electrocardiogram with no acute ST changes. Patient can be discharged from a cardiac standpoint. Will arrange  outpatient functional study at the [place] where she is followed.  2 question atrial flutter-I have reviewed strips that were scanned and there appears to be artifact but no atrial flutter.  3 tobacco abuse-patient counseled on discontinuing.  4 hypertension-blood pressure controlled. Continue present medications.  Patient can be discharged from a cardiac standpoint. Please call with questions.  Kirk Ruths, MD

## 2017-01-17 NOTE — Discharge Summary (Signed)
Physician Discharge Summary  Jessica Vega CBJ:[id number] DOB: [birth year] DOA: 01/16/2017  PCP: Tennis Must, MD  Admit date: 01/16/2017 Discharge date: 01/17/2017   Recommendations for Outpatient Follow-Up:   outpatient functional study at the [facility] Outpatient follow up for diaphragm   Discharge Diagnosis:   Principal Problem:   Chest pain Active Problems:   Dyspnea   Acute myeloid leukemia in remission [facility])   History of pericarditis   HTN (hypertension)   Discharge disposition:  Home  Discharge Condition: Improved.  Diet recommendation: Low sodium, heart healthy.  Carbohydrate-modified.  Wound care: None.   History of Present Illness:   Jessica Vega is a 55 y.o. female with medical history significant of hypertension, anemia, acute myeloid leukemia in remission, status post allogenic bone marrow transplant complicated by aspergillosis pneumonia and cytomegalovirus viremia, neutropenic fever treated with broad-spectrum antibiotics and complicated by C. difficile colitis, a right-sided pleural effusion presented ot the ED with c/o chest a[pin She is a patient of [facility] in [place] and for myeloid leukemia chemotherapy and of other multiple medical problems she is being followed at the [facility].    Hospital Course by Problem:   chest pain-symptoms are atypical and seems most consistent with musculoskeletal pain. Patient has been doing physical labor more than normal.  Enzymes are negative. Electrocardiogram with no acute ST changes. outpatient functional study at the [place] where she is followed.  question atrial flutter-cardiology reviewed strips that were scanned and there appears to be artifact but no atrial flutter.  tobacco abuse-patient counseled on discontinuing.  hypertension-blood pressure controlled. Continue present medications.    Medical Consultants:    Cards   Discharge Exam:   Vitals:   01/17/17 0604  01/17/17 0843  BP: 117/70 109/76  Pulse: 73   Resp:    Temp: 98.1 F (36.7 C)    Vitals:   01/16/17 1828 01/16/17 2044 01/17/17 0604 01/17/17 0843  BP: 108/82 110/67 117/70 109/76  Pulse: 76 72 73   Resp: 18 20    Temp: 98.2 F (36.8 C) 97.8 F (36.6 C) 98.1 F (36.7 C)   TempSrc: Oral Oral Oral   SpO2: 99% 95% 97%   Weight: 75.6 kg (166 lb 9.6 oz)  75.4 kg (166 lb 4.8 oz)   Height: 5\' 2"  (1.575 m)       Gen:  NAD    The results of significant diagnostics from this hospitalization (including imaging, microbiology, ancillary and laboratory) are listed below for reference.     Procedures and Diagnostic Studies:   Ct Angio Chest Pe W And/or Wo Contrast  Result Date: 01/16/2017 CLINICAL DATA:  Pleuritic chest pain and short of breath. History of AML and bone marrow transplant EXAM: CT ANGIOGRAPHY CHEST WITH CONTRAST TECHNIQUE: Multidetector CT imaging of the chest was performed using the standard protocol during bolus administration of intravenous contrast. Multiplanar CT image reconstructions and MIPs were obtained to evaluate the vascular anatomy. CONTRAST:  60 mL Isovue 370 IV COMPARISON:  Chest two-view 04/30/2013 FINDINGS: Cardiovascular: Image quality degraded by patient most the motion. This degree of motion could obscure small pulmonary emboli Negative for pulmonary emboli. Pulmonary arteries normal in caliber. Negative for aortic aneurysm. Heart size within normal limits. Negative for pericardial effusion. Mediastinum/Nodes: Negative Lungs/Pleura: Right middle lobe atelectasis. Right lower lobe atelectasis. Negative for pneumonia or effusion. Negative for mass lesion. Upper Abdomen: Cholecystectomy. No acute abnormality in the upper abdomen. Musculoskeletal: Multiple Mild compression fractures in the thoracic spine most  likely benign and chronic. Review of the MIP images confirms the above findings. IMPRESSION: Negative for pulmonary emboli.  No acute abnormality in the chest  Right middle lobe and right lower lobe atelectasis. Image quality degraded by motion. Electronically Signed   By: Franchot Gallo M.D.   On: 01/16/2017 15:18     Labs:   Basic Metabolic Panel:  Recent Labs Lab 01/16/17 1236  NA 138  K 3.8  CL 104  CO2 24  GLUCOSE 80  BUN 11  CREATININE 0.92  CALCIUM 9.2   GFR Estimated Creatinine Clearance: 65.7 mL/min (by C-G formula based on SCr of 0.92 mg/dL). Liver Function Tests: No results for input(s): AST, ALT, ALKPHOS, BILITOT, PROT, ALBUMIN in the last 168 hours. No results for input(s): LIPASE, AMYLASE in the last 168 hours. No results for input(s): AMMONIA in the last 168 hours. Coagulation profile No results for input(s): INR, PROTIME in the last 168 hours.  CBC:  Recent Labs Lab 01/16/17 1236  WBC 9.4  HGB 15.7*  HCT 45.1  MCV 91.3  PLT 286   Cardiac Enzymes:  Recent Labs Lab 01/16/17 1845 01/16/17 2246 01/17/17 0041  TROPONINI <0.03 <0.03 <0.03   BNP: Invalid input(s): POCBNP CBG: No results for input(s): GLUCAP in the last 168 hours. D-Dimer No results for input(s): DDIMER in the last 72 hours. Hgb A1c No results for input(s): HGBA1C in the last 72 hours. Lipid Profile No results for input(s): CHOL, HDL, LDLCALC, TRIG, CHOLHDL, LDLDIRECT in the last 72 hours. Thyroid function studies No results for input(s): TSH, T4TOTAL, T3FREE, THYROIDAB in the last 72 hours.  Invalid input(s): FREET3 Anemia work up No results for input(s): VITAMINB12, FOLATE, FERRITIN, TIBC, IRON, RETICCTPCT in the last 72 hours. Microbiology No results found for this or any previous visit (from the past 240 hour(s)).   Discharge Instructions:   Discharge Instructions    Diet - low sodium heart healthy    Complete by:  As directed    Discharge instructions    Complete by:  As directed    outpatient functional study at the [facility]  Outpatient follow up for right raised diaphragm   Increase activity slowly    Complete by:  As  directed      Allergies as of 01/17/2017      Reactions   Chlorhexidine Rash   Other Itching   Transparent dressing causes itching per pt   Latex Rash      Medication List    STOP taking these medications   azithromycin 250 MG tablet Commonly known as:  ZITHROMAX   HYDROcodone-homatropine 5-1.5 MG/5ML syrup Commonly known as:  HYCODAN     TAKE these medications   acetaminophen 500 MG tablet Commonly known as:  TYLENOL Take 500 mg by mouth every 6 (six) hours as needed.   Albuterol Sulfate 108 (90 Base) MCG/ACT Aepb Commonly known as:  PROAIR RESPICLICK Inhale 2 puffs into the lungs every 4 (four) hours as needed.   amLODipine 5 MG tablet Commonly known as:  NORVASC Take 1 tablet (5 mg total) by mouth daily.   aspirin 81 MG tablet Take 81 mg by mouth daily.   cholecalciferol 1000 units tablet Commonly known as:  VITAMIN D Take 1,000 Units by mouth daily.   cyclobenzaprine 10 MG tablet Commonly known as:  FLEXERIL Take 10 mg by mouth daily as needed for muscle spasms.   famotidine 20 MG tablet Commonly known as:  PEPCID Take 1 tablet (20 mg total) by  mouth 2 (two) times daily.   ibuprofen 200 MG tablet Commonly known as:  ADVIL,MOTRIN Take 400 mg by mouth every 6 (six) hours as needed for pain.   potassium chloride SA 20 MEQ tablet Commonly known as:  K-DUR,KLOR-CON Take 20 mEq by mouth daily.   predniSONE 20 MG tablet Commonly known as:  DELTASONE Two daily with food         Time coordinating discharge: 35 min  Signed:  Caron Tardif U Jaymarion Trombly   [facility] 01/17/2017, 10:49 AM

## 2017-01-17 NOTE — Progress Notes (Signed)
Pt encouraged to stop smoking. Pt stated she did not need any information about trying to quit and that she already had information. Pt stated that she has quit in the past and thinks she can quit again.

## 2017-04-03 ENCOUNTER — Inpatient Hospital Stay (HOSPITAL_COMMUNITY): Admit: 2017-04-03 | Payer: Non-veteran care

## 2017-04-03 ENCOUNTER — Emergency Department (HOSPITAL_COMMUNITY): Payer: Medicare Other

## 2017-04-03 ENCOUNTER — Inpatient Hospital Stay (HOSPITAL_COMMUNITY): Payer: Medicare Other

## 2017-04-03 ENCOUNTER — Encounter (HOSPITAL_COMMUNITY): Payer: Self-pay | Admitting: Nurse Practitioner

## 2017-04-03 ENCOUNTER — Observation Stay (HOSPITAL_COMMUNITY)
Admission: EM | Admit: 2017-04-03 | Discharge: 2017-04-04 | Disposition: A | Discharge status: Home / Self Care | Payer: Medicare Other | Attending: Family Medicine | Admitting: Family Medicine

## 2017-04-03 DIAGNOSIS — I10 Essential (primary) hypertension: Secondary | ICD-10-CM | POA: Insufficient documentation

## 2017-04-03 DIAGNOSIS — T8600 Unspecified complication of bone marrow transplant: Secondary | ICD-10-CM | POA: Diagnosis not present

## 2017-04-03 DIAGNOSIS — Z91040 Latex allergy status: Secondary | ICD-10-CM | POA: Diagnosis not present

## 2017-04-03 DIAGNOSIS — Z79899 Other long term (current) drug therapy: Secondary | ICD-10-CM | POA: Insufficient documentation

## 2017-04-03 DIAGNOSIS — D89813 Graft-versus-host disease, unspecified: Secondary | ICD-10-CM | POA: Diagnosis not present

## 2017-04-03 DIAGNOSIS — I4892 Unspecified atrial flutter: Secondary | ICD-10-CM | POA: Diagnosis not present

## 2017-04-03 DIAGNOSIS — F17200 Nicotine dependence, unspecified, uncomplicated: Secondary | ICD-10-CM | POA: Diagnosis present

## 2017-04-03 DIAGNOSIS — R918 Other nonspecific abnormal finding of lung field: Secondary | ICD-10-CM | POA: Diagnosis present

## 2017-04-03 DIAGNOSIS — M25572 Pain in left ankle and joints of left foot: Secondary | ICD-10-CM

## 2017-04-03 DIAGNOSIS — Z7982 Long term (current) use of aspirin: Secondary | ICD-10-CM | POA: Diagnosis not present

## 2017-04-03 DIAGNOSIS — Z888 Allergy status to other drugs, medicaments and biological substances status: Secondary | ICD-10-CM | POA: Diagnosis not present

## 2017-04-03 DIAGNOSIS — C9200 Acute myeloblastic leukemia, not having achieved remission: Secondary | ICD-10-CM | POA: Insufficient documentation

## 2017-04-03 DIAGNOSIS — Z9481 Bone marrow transplant status: Secondary | ICD-10-CM | POA: Diagnosis not present

## 2017-04-03 DIAGNOSIS — I82622 Acute embolism and thrombosis of deep veins of left upper extremity: Principal | ICD-10-CM | POA: Diagnosis present

## 2017-04-03 DIAGNOSIS — M542 Cervicalgia: Secondary | ICD-10-CM | POA: Diagnosis present

## 2017-04-03 DIAGNOSIS — R079 Chest pain, unspecified: Secondary | ICD-10-CM | POA: Insufficient documentation

## 2017-04-03 DIAGNOSIS — F17210 Nicotine dependence, cigarettes, uncomplicated: Secondary | ICD-10-CM | POA: Insufficient documentation

## 2017-04-03 DIAGNOSIS — R911 Solitary pulmonary nodule: Secondary | ICD-10-CM | POA: Diagnosis not present

## 2017-04-03 DIAGNOSIS — Z9221 Personal history of antineoplastic chemotherapy: Secondary | ICD-10-CM | POA: Diagnosis not present

## 2017-04-03 DIAGNOSIS — I8290 Acute embolism and thrombosis of unspecified vein: Secondary | ICD-10-CM

## 2017-04-03 DIAGNOSIS — I82409 Acute embolism and thrombosis of unspecified deep veins of unspecified lower extremity: Secondary | ICD-10-CM | POA: Diagnosis present

## 2017-04-03 LAB — COMPREHENSIVE METABOLIC PANEL WITH GFR
ALT: 21 U/L (ref 14–54)
AST: 23 U/L (ref 15–41)
Albumin: 4.3 g/dL (ref 3.5–5.0)
Alkaline Phosphatase: 68 U/L (ref 38–126)
Anion gap: 10 (ref 5–15)
BUN: 17 mg/dL (ref 6–20)
CO2: 22 mmol/L (ref 22–32)
Calcium: 9.4 mg/dL (ref 8.9–10.3)
Chloride: 109 mmol/L (ref 101–111)
Creatinine, Ser: 0.96 mg/dL (ref 0.44–1.00)
GFR calc Af Amer: >60 mL/min
GFR calc non Af Amer: >60 mL/min
Glucose, Bld: 99 mg/dL (ref 65–99)
Potassium: 3.5 mmol/L (ref 3.5–5.1)
Sodium: 141 mmol/L (ref 135–145)
Total Bilirubin: 0.9 mg/dL (ref 0.3–1.2)
Total Protein: 7.7 g/dL (ref 6.5–8.1)

## 2017-04-03 LAB — CBC WITH DIFFERENTIAL/PLATELET
BASOS ABS: 0 10*3/uL (ref 0.0–0.1)
BASOS PCT: 0 %
EOS ABS: 0.1 10*3/uL (ref 0.0–0.7)
Eosinophils Relative: 1 %
HCT: 44.5 % (ref 36.0–46.0)
HEMOGLOBIN: 16.2 g/dL — AB (ref 12.0–15.0)
Lymphocytes Relative: 34 %
Lymphs Abs: 3.7 10*3/uL (ref 0.7–4.0)
MCH: 32.9 pg (ref 26.0–34.0)
MCHC: 36.4 g/dL — AB (ref 30.0–36.0)
MCV: 90.3 fL (ref 78.0–100.0)
MONOS PCT: 9 %
Monocytes Absolute: 1 10*3/uL (ref 0.1–1.0)
NEUTROS PCT: 56 %
Neutro Abs: 6 10*3/uL (ref 1.7–7.7)
Platelets: 239 10*3/uL (ref 150–400)
RBC: 4.93 MIL/uL (ref 3.87–5.11)
RDW: 13.6 % (ref 11.5–15.5)
WBC: 10.7 10*3/uL — ABNORMAL HIGH (ref 4.0–10.5)

## 2017-04-03 LAB — MAGNESIUM: MAGNESIUM: 2.1 mg/dL (ref 1.7–2.4)

## 2017-04-03 LAB — HEPARIN LEVEL (UNFRACTIONATED)
HEPARIN UNFRACTIONATED: 0.91 [IU]/mL — AB (ref 0.30–0.70)
Heparin Unfractionated: 0.68 IU/mL (ref 0.30–0.70)

## 2017-04-03 LAB — PROTIME-INR
INR: 1
PROTHROMBIN TIME: 13.1 s (ref 11.4–15.2)

## 2017-04-03 LAB — I-STAT CG4 LACTIC ACID, ED: Lactic Acid, Venous: 1.11 mmol/L (ref 0.5–1.9)

## 2017-04-03 LAB — APTT: aPTT: 28 seconds (ref 24–36)

## 2017-04-03 MED ORDER — IOPAMIDOL (ISOVUE-300) INJECTION 61%
INTRAVENOUS | Status: AC
  Administered 2017-04-03: 75 mL via INTRAVENOUS
  Filled 2017-04-03: qty 75

## 2017-04-03 MED ORDER — ASPIRIN EC 81 MG PO TBEC
81.0000 mg | DELAYED_RELEASE_TABLET | Freq: Every day | ORAL | Status: DC
  Administered 2017-04-03 – 2017-04-04 (×2): 81 mg via ORAL
  Filled 2017-04-03 (×2): qty 1

## 2017-04-03 MED ORDER — SODIUM CHLORIDE 0.9 % IV SOLN
INTRAVENOUS | Status: DC
  Administered 2017-04-03 – 2017-04-04 (×2): via INTRAVENOUS

## 2017-04-03 MED ORDER — ONDANSETRON HCL 4 MG/2ML IJ SOLN
4.0000 mg | Freq: Once | INTRAMUSCULAR | Status: AC
Start: 2017-04-03 — End: 2017-04-03
  Administered 2017-04-03: 4 mg via INTRAVENOUS
  Filled 2017-04-03: qty 2

## 2017-04-03 MED ORDER — HEPARIN (PORCINE) IN NACL 100-0.45 UNIT/ML-% IJ SOLN
1000.0000 [IU]/h | INTRAMUSCULAR | Status: DC
  Administered 2017-04-03: 1000 [IU]/h via INTRAVENOUS
  Filled 2017-04-03: qty 250

## 2017-04-03 MED ORDER — POTASSIUM CHLORIDE CRYS ER 20 MEQ PO TBCR
20.0000 meq | EXTENDED_RELEASE_TABLET | Freq: Two times a day (BID) | ORAL | Status: DC
  Administered 2017-04-03 – 2017-04-04 (×3): 20 meq via ORAL
  Filled 2017-04-03 (×3): qty 1

## 2017-04-03 MED ORDER — HEPARIN (PORCINE) IN NACL 100-0.45 UNIT/ML-% IJ SOLN
850.0000 [IU]/h | INTRAMUSCULAR | Status: DC
  Administered 2017-04-04: 850 [IU]/h via INTRAVENOUS
  Filled 2017-04-03: qty 250

## 2017-04-03 MED ORDER — CYCLOBENZAPRINE HCL 10 MG PO TABS
10.0000 mg | ORAL_TABLET | Freq: Every day | ORAL | Status: DC | PRN
  Administered 2017-04-03 – 2017-04-04 (×2): 10 mg via ORAL
  Filled 2017-04-03 (×2): qty 1

## 2017-04-03 MED ORDER — NICOTINE 21 MG/24HR TD PT24: 21.0000 mg | MEDICATED_PATCH | Freq: Every day | TRANSDERMAL | Status: DC | PRN

## 2017-04-03 MED ORDER — FAMOTIDINE 20 MG PO TABS
20.0000 mg | ORAL_TABLET | Freq: Two times a day (BID) | ORAL | Status: DC
  Administered 2017-04-03 – 2017-04-04 (×2): 20 mg via ORAL
  Filled 2017-04-03 (×2): qty 1

## 2017-04-03 MED ORDER — FAMOTIDINE 20 MG PO TABS
20.0000 mg | ORAL_TABLET | Freq: Once | ORAL | Status: AC
  Administered 2017-04-03: 20 mg via ORAL
  Filled 2017-04-03: qty 1

## 2017-04-03 MED ORDER — OXYCODONE-ACETAMINOPHEN 5-325 MG PO TABS
1.0000 | ORAL_TABLET | ORAL | Status: DC | PRN
  Administered 2017-04-03: 1 via ORAL
  Filled 2017-04-03: qty 1

## 2017-04-03 MED ORDER — AMLODIPINE BESYLATE 5 MG PO TABS
5.0000 mg | ORAL_TABLET | Freq: Every day | ORAL | Status: DC
  Administered 2017-04-03: 5 mg via ORAL
  Filled 2017-04-03: qty 1

## 2017-04-03 MED ORDER — ONDANSETRON HCL 4 MG/2ML IJ SOLN: 4.0000 mg | Freq: Four times a day (QID) | INTRAMUSCULAR | Status: DC | PRN

## 2017-04-03 MED ORDER — HEPARIN BOLUS VIA INFUSION
4000.0000 [IU] | Freq: Once | INTRAVENOUS | Status: AC
  Administered 2017-04-03: 4000 [IU] via INTRAVENOUS
  Filled 2017-04-03: qty 4000

## 2017-04-03 MED ORDER — MORPHINE SULFATE (PF) 2 MG/ML IV SOLN
4.0000 mg | Freq: Once | INTRAVENOUS | Status: AC
  Administered 2017-04-03: 4 mg via INTRAVENOUS
  Filled 2017-04-03: qty 2

## 2017-04-03 MED ORDER — ONDANSETRON HCL 4 MG PO TABS: 4.0000 mg | ORAL_TABLET | Freq: Four times a day (QID) | ORAL | Status: DC | PRN

## 2017-04-03 MED ORDER — PROMETHAZINE HCL 25 MG/ML IJ SOLN
12.5000 mg | Freq: Four times a day (QID) | INTRAMUSCULAR | Status: DC | PRN
  Filled 2017-04-03: qty 1

## 2017-04-03 MED ORDER — PANTOPRAZOLE SODIUM 40 MG PO TBEC
40.0000 mg | DELAYED_RELEASE_TABLET | Freq: Every day | ORAL | Status: DC
  Administered 2017-04-03 – 2017-04-04 (×2): 40 mg via ORAL
  Filled 2017-04-03 (×2): qty 1

## 2017-04-03 MED ORDER — IOPAMIDOL (ISOVUE-300) INJECTION 61%
75.0000 mL | Freq: Once | INTRAVENOUS | Status: AC | PRN
  Administered 2017-04-03: 75 mL via INTRAVENOUS

## 2017-04-03 NOTE — ED Provider Notes (Signed)
Hand-off from [employer], PA-C. Dispo pending CT neck.  See initial provider's note for full HPI.  Briefly, pt is a 56 yo female with PMH of AML (in remission s/p chemo and bone marrow transplant in 2015), anemia, HTN who presents with complaint of gradually worsening left sided neck pain/swelling, onset 11am yesterday. Pain worse with movement. Denies any known fall, injury, or head/neck trauma. Denies fever, HA, visual changes, numbness, tingling, weakness, CP, SOB.   Physical Exam  BP 113/86 (BP Location: Right Arm)   Pulse 84   Temp 97.9 F (36.6 C) (Oral)   Resp 18   SpO2 96%   Physical Exam  Constitutional: She is oriented to person, place, and time. She appears well-developed and well-nourished.  HENT:  Head: Normocephalic and atraumatic.  Mouth/Throat: Oropharynx is clear and moist. No oropharyngeal exudate.  Eyes: Conjunctivae and EOM are normal. Pupils are equal, round, and reactive to light. Right eye exhibits no discharge. Left eye exhibits no discharge. No scleral icterus.  Neck: Normal range of motion. Neck supple.  Mild generalized swelling and tenderness over left side of neck and upper back. Mild swelling and tenderness over left EJ. No erythema or warmth. No midline spinal tenderness. FROM of neck.   Cardiovascular: Normal rate, regular rhythm, normal heart sounds and intact distal pulses.   Pulmonary/Chest: Effort normal and breath sounds normal. No respiratory distress. She has no wheezes. She has no rales. She exhibits no tenderness.  Abdominal: Soft. Bowel sounds are normal. There is no tenderness.  Musculoskeletal: Normal range of motion. She exhibits no edema or tenderness.  Neurological: She is alert and oriented to person, place, and time. She has normal strength. No cranial nerve deficit or sensory deficit. Coordination normal.  Skin: Skin is warm and dry. No rash noted.  Nursing note and vitals reviewed.   ED Course  Procedures  MDM Patient  presents with gradually worsening swelling to the left side of her neck that started last night. Denies any recent fall, trauma or head/neck injury. Denies fever. PMH of AML (in remission s/p chemo and bone marrow transplant in 2015), anemia, HTN. VSS. Exam showed distention of left jugular vein, mild TTP. Labs unremarkable. CT neck pending due to concern for DVT.   CT showed DVT and left subclavian, brachiocephalic and lower jugular veins. Pt started on heparin in the ED. Discussed results and plan for admission with pt. Consulted hospitalist. Dr. Olevia Bowens agrees to admission.        Nona Dell, PA-C 04/03/17 0803    Ward, Delice Bison, DO 04/03/17 [phone]

## 2017-04-03 NOTE — ED Provider Notes (Signed)
[place] DEPT Provider Note   CSN: [id number] Arrival date & time: 04/03/17  0026     History   Chief Complaint Chief Complaint  Patient presents with  . Lymphadenopathy    Left Neck Swelling    HPI Jessica Vega is a 56 y.o. female with a hx of AML, anemia, HTN presents to the Emergency Department complaining of gradual, persistent, progressively worsening left sided neck swelling and pain onset around 11am.  Pt reports hx of AML in remission after bone marrow transplant.  Pt reports no fevers, chills, weight loss.  No known injury to the head/neck.  No recent procedures.  No hx of DVT.  Pt reports the pain is throbbing, pressure-like and pulling.  No visual changes, numbness, tingling, weakness, swelling of her arms or legs. Movement makes the symptoms worse and nothing makes them better.       The history is provided by the patient and medical records. No language interpreter was used.    Past Medical History:  Diagnosis Date  . AML (acute myeloblastic leukemia) ([place])    s/p bone marrow transplant in 8366 complicated by CMV, colitis, and sepsis  . Anemia   . Hypertension     Patient Active Problem List   Diagnosis Date Noted  . Chest pain 01/16/2017  . Dyspnea 01/16/2017  . Acute myeloid leukemia in remission ([place]) 01/16/2017  . History of pericarditis 01/16/2017  . HTN (hypertension) 01/16/2017  . Atypical chest pain   . Atrial flutter ([place])   . Other pancytopenia ([place]) 04/30/2013    Past Surgical History:  Procedure Laterality Date  . ABDOMINAL HYSTERECTOMY    . CHOLECYSTECTOMY      OB History    No data available       Home Medications    Prior to Admission medications   Medication Sig Start Date End Date Taking? Authorizing Provider  Albuterol Sulfate (PROAIR RESPICLICK) 294 (90 Base) MCG/ACT AEPB Inhale 2 puffs into the lungs every 4 (four) hours as needed. Patient taking differently: Inhale 2 puffs into the lungs every 4 (four) hours as  needed (SOB, wheezing).  09/27/16  Yes Robyn Haber, MD  amLODipine (NORVASC) 5 MG tablet Take 1 tablet (5 mg total) by mouth daily. Patient taking differently: Take 5 mg by mouth daily after breakfast.  09/27/16  Yes Robyn Haber, MD  aspirin 81 MG tablet Take 81 mg by mouth daily after breakfast.    Yes [provider]  cholecalciferol (VITAMIN D) 1000 UNITS tablet Take 1,000 Units by mouth daily after breakfast.    Yes [provider]  cyclobenzaprine (FLEXERIL) 10 MG tablet Take 10 mg by mouth daily as needed for muscle spasms.   Yes [provider]  omeprazole (PRILOSEC) 20 MG capsule Take 20 mg by mouth 2 (two) times daily as needed (reflux).  08/31/15  Yes [provider]  potassium chloride SA (K-DUR,KLOR-CON) 20 MEQ tablet Take 20 mEq by mouth 2 (two) times daily.    Yes [provider]  famotidine (PEPCID) 20 MG tablet Take 1 tablet (20 mg total) by mouth 2 (two) times daily. Patient not taking: Reported on 01/16/2017 04/30/13   Teressa Lower, MD    Family History Family History  Problem Relation Age of Onset  . CVA Mother   . Diabetes Mellitus II Father   . Renal Disease Father     Social History Social History  Substance Use Topics  . Smoking status: Current Every Day Smoker  Packs/day: 1.00    Types: Cigarettes  . Smokeless tobacco: Never Used  . Alcohol use 0.6 oz/week    1 Glasses of wine per week     Comment: rarely     Allergies   Chlorhexidine; Other; and Latex   Review of Systems Review of Systems  Musculoskeletal: Positive for neck pain. Negative for neck stiffness.  Skin: Negative for rash.  All other systems reviewed and are negative.    Physical Exam Updated Vital Signs BP 115/86 (BP Location: Right Arm)   Pulse 72   Temp 98.2 F (36.8 C) (Oral)   Resp 18   SpO2 99%   Physical Exam  Constitutional: She appears well-developed and well-nourished. No distress.  Awake, alert, nontoxic  appearance  HENT:  Head: Normocephalic and atraumatic.  Mouth/Throat: Oropharynx is clear and moist. No oropharyngeal exudate.  Eyes: Conjunctivae are normal. No scleral icterus.  Neck: Normal range of motion. JVD ( left side only) present. No spinous process tenderness and no muscular tenderness present. Carotid bruit is not present. Normal range of motion present.  Left EJ with distension, swelling and tenderness to palpation.  Generalized edema distal to the focal swelling extending along the anterior and posterior shoulder.  No erythema or increased warmth.   No midline or paraspinal tenderness FROM of the cervical spine  Cardiovascular: Normal rate, regular rhythm and intact distal pulses.   Pulmonary/Chest: Effort normal and breath sounds normal. No respiratory distress. She has no wheezes.  Equal chest expansion  Abdominal: Soft. Bowel sounds are normal. She exhibits no mass. There is no tenderness. There is no rebound and no guarding.  Musculoskeletal: Normal range of motion. She exhibits no edema.  Lymphadenopathy:       Head (right side): No submental, no submandibular, no tonsillar, no preauricular, no posterior auricular and no occipital adenopathy present.       Head (left side): Posterior auricular adenopathy present. No submental, no submandibular, no tonsillar, no preauricular and no occipital adenopathy present.  Neurological: She is alert.  Speech is clear and goal oriented Moves extremities without ataxia  Skin: Skin is warm and dry. She is not diaphoretic.  Psychiatric: She has a normal mood and affect.  Nursing note and vitals reviewed.    ED Treatments / Results  Labs (all labs ordered are listed, but only abnormal results are displayed) Labs Reviewed  CBC WITH DIFFERENTIAL/PLATELET - Abnormal; Notable for the following:       Result Value   WBC 10.7 (*)    Hemoglobin 16.2 (*)    MCHC 36.4 (*)    All other components within normal limits  COMPREHENSIVE  METABOLIC PANEL  I-STAT CG4 LACTIC ACID, ED     Procedures Procedures (including critical care time)  Medications Ordered in ED Medications  iopamidol (ISOVUE-300) 61 % injection 75 mL (75 mLs Intravenous Contrast Given 04/03/17 0631)     Initial Impression / Assessment and Plan / ED Course  I have reviewed the triage vital signs and the nursing notes.  Pertinent labs & imaging results that were available during my care of the patient were reviewed by me and considered in my medical decision making (see chart for details).     Patient presents with swelling of the left side the neck. Distention of the left jugular vein with tortuosity. Concern for possible DVT. CT scans pending. Labs reassuring.  Care transferred to [facility], PA-C.  She will follow imaging, reassess and determine disposition.  Final Clinical Impressions(s) /  ED Diagnoses   Final diagnoses:  Neck pain    New Prescriptions New Prescriptions   No medications on file     Agapito Games 04/03/17 [place], Delice Bison, DO 04/03/17 [phone]

## 2017-04-03 NOTE — Progress Notes (Signed)
[place] for IV heparin Indication: DVT  Allergies  Allergen Reactions  . Chlorhexidine Rash  . Other Itching    Transparent dressing causes itching per pt  . Latex Rash    Patient Measurements: Height: 5\' 2"  (158.3 cm) Weight: 166 lb 3.6 oz (75.4 kg) IBW/kg (Calculated) : 50.1 Heparin Dosing Weight: 67 kg  Vital Signs: Temp: 97.7 F (36.5 C) (07/02 1500) Temp Source: Oral (07/02 1500) BP: 110/72 (07/02 1500) Pulse Rate: 75 (07/02 1500)  Labs:  Recent Labs  04/03/17 0224 04/03/17 0730 04/03/17 0800 04/03/17 1440  HGB 16.2*  --   --   --   HCT 44.5  --   --   --   PLT 239  --   --   --   APTT  --   --  28  --   LABPROT  --  13.1  --   --   INR  --  1.00  --   --   HEPARINUNFRC  --   --   --  0.91*  CREATININE 0.96  --   --   --     Estimated Creatinine Clearance: 62.9 mL/min (by C-G formula based on SCr of 0.96 mg/dL).   Assessment: Pharmacy is consulted to dose IV heparin in 56 yo female  with a  possible DVT. Pt not on any blood thinners PTA. On aspirin 81 mg daily. PMH includes hx of AML in remission after bone marrow transplant, anemia, HTN. Pt presented to the Emergency Department complaining of gradual, persistent, progressively worsening left sided neck swelling and pain.  On admission INR is 1.00 and PT is 13.1 First heparin level is above goal at 0.91 drawn 6 hours after a 4000 unit bolus and 1000 unit/hr drip.  No bleeding reported. RN reports pt doing OK.   Goal of Therapy:  Heparin level 0.3-0.7 units/ml Monitor platelets by anticoagulation protocol: Yes   Plan:  Decrease heparin drip to 850 units/hr and check 6 hr heparin level Daily CBC and heparin level F/u for transition to oral agent  Eudelia Bunch, Pharm.D. [phone] 04/03/2017 4:40 PM

## 2017-04-03 NOTE — ED Notes (Signed)
PA at bedside.

## 2017-04-03 NOTE — ED Triage Notes (Signed)
Pt states a couple of hours before she laid down for the night, she noticed a pulling/tense discomfort localizing of her left temporal region and radiating to her left neck. She looked at the mirror and noticed a significant swelling of the same area. The swelling is appreciable, no obvious signs of trauma, pt denies trauma but remarks on hx of AML of which she is on remission.

## 2017-04-03 NOTE — Progress Notes (Signed)
ANTICOAGULATION CONSULT NOTE - Initial Consult  Pharmacy Consult for IV heparin Indication: DVT  Allergies  Allergen Reactions  . Chlorhexidine Rash  . Other Itching    Transparent dressing causes itching per pt  . Latex Rash    Patient Measurements: Weight: 170 lb (77.1 kg) Heparin Dosing Weight: 67 kg  Vital Signs: Temp: 97.9 F (36.6 C) (07/02 0533) Temp Source: Oral (07/02 0533) BP: 113/86 (07/02 0533) Pulse Rate: 84 (07/02 0533)  Labs:  Recent Labs  04/03/17 0224  HGB 16.2*  HCT 44.5  PLT 239  CREATININE 0.96    Estimated Creatinine Clearance: 63.7 mL/min (by C-G formula based on SCr of 0.96 mg/dL).   Medical History: Past Medical History:  Diagnosis Date  . AML (acute myeloblastic leukemia) ([place])    s/p bone marrow transplant in 4239 complicated by CMV, colitis, and sepsis  . Anemia   . Hypertension     Medications:  Scheduled:  . heparin  4,000 Units Intravenous Once    Assessment: Pharmacy is consulted to dose IV heparin in 56 yo female  with a  possible DVT. Pt not on any blood thinners PTA. On aspirin 81 mg daily. PMH includes hx of AML in remission after bone marrow transplant, anemia, HTN. Pt presented to the Emergency Department complaining of gradual, persistent, progressively worsening left sided neck swelling and pain.  On admission INR is 1.00 and PT is 13.1  Goal of Therapy:  Heparin level 0.3-0.7 units/ml Monitor platelets by anticoagulation protocol: Yes   Plan:   Heparin 4000 unit bolus, then heparin 1000 units/hr  Heparin level 6 hours after start of infusion  Monitor for signs and symptoms of bleeding   Royetta Asal, PharmD, BCPS Pager [phone] 04/03/2017 7:53 AM

## 2017-04-03 NOTE — H&P (Signed)
History and Physical    Jessica Vega DOB: 08-06-1961 DOA: 04/03/2017  PCP: Tennis Must, MD   Patient coming from: Home.  I have personally briefly reviewed patient's old medical records in [place]  Chief Complaint: Left-sided headache and neck pain.  HPI: Jessica Vega is a 56 y.o. female with medical history significant of history of AML, S/P bone marrow transplant in 2016 with CMV infection complication, anemia, hypertension was coming to the emergency department due to above complaint.  Per patient, she went to sleep and lay down on her right side. About 2-3 hours, she woke up and noticed that the pulling tenths discomfort on her left temporal area, face and neck. She looked at herself in the mirror noticed significant swelling in the area. She denies any history of trauma to the area. She denies personal or family history of venous thrombosis. She denies fever, chills, palpitations, chest pain, emesis, dizziness, diaphoresis, diarrhea, melena or hematochezia. She complains of nausea after being given IV morphine earlier in the emergency department and mild constipation recently. She denies dysuria, frequency or hematuria. She denies is skin rashes or pruritus.  ED Course: Initial vital signs in the emergency department temperature 98.2, pulse 72, blood pressure 115/86 mmHg, respirations 18 O2 sat 99% on room air. The patient received morphine 4 mg IVP 1, Zofran 4 mg IVP 1 and heparin infusion per pharmacy after CT scan showed left subclavian, brachiocephalic and lower jugular DVT. Coagulation studies were normal, WBC 10.7 with a normal differential, hemoglobin 16.2 g/dL and platelets 239. Her CMP and rest of the chemistry was within normal limits.  Review of Systems: As per HPI otherwise 10 point review of systems negative.    Past Medical History:  Diagnosis Date  . AML (acute myeloblastic leukemia) ([place])    s/p bone marrow transplant in 0737  complicated by CMV, colitis, and sepsis  . Anemia   . Hypertension     Past Surgical History:  Procedure Laterality Date  . ABDOMINAL HYSTERECTOMY    . CHOLECYSTECTOMY       reports that she has been smoking Cigarettes.  She has been smoking about 1.00 pack per day. She has never used smokeless tobacco. She reports that she drinks about 0.6 oz of alcohol per week . She reports that she does not use drugs.  Allergies  Allergen Reactions  . Chlorhexidine Rash  . Other Itching    Transparent dressing causes itching per pt  . Latex Rash    Family History  Problem Relation Age of Onset  . CVA Mother   . Diabetes Mellitus II Father   . Renal Disease Father     Prior to Admission medications   Medication Sig Start Date End Date Taking? Authorizing Provider  Albuterol Sulfate (PROAIR RESPICLICK) 106 (90 Base) MCG/ACT AEPB Inhale 2 puffs into the lungs every 4 (four) hours as needed. Patient taking differently: Inhale 2 puffs into the lungs every 4 (four) hours as needed (SOB, wheezing).  09/27/16  Yes Robyn Haber, MD  amLODipine (NORVASC) 5 MG tablet Take 1 tablet (5 mg total) by mouth daily. Patient taking differently: Take 5 mg by mouth daily after breakfast.  09/27/16  Yes Robyn Haber, MD  aspirin 81 MG tablet Take 81 mg by mouth daily after breakfast.    Yes [provider]  cholecalciferol (VITAMIN D) 1000 UNITS tablet Take 1,000 Units by mouth daily after breakfast.    Yes [provider]  cyclobenzaprine (FLEXERIL)  10 MG tablet Take 10 mg by mouth daily as needed for muscle spasms.   Yes [provider]  omeprazole (PRILOSEC) 20 MG capsule Take 20 mg by mouth 2 (two) times daily as needed (reflux).  08/31/15  Yes [provider]  potassium chloride SA (K-DUR,KLOR-CON) 20 MEQ tablet Take 20 mEq by mouth 2 (two) times daily.    Yes [provider]  famotidine (PEPCID) 20 MG tablet Take 1 tablet (20 mg total) by mouth 2 (two)  times daily. Patient not taking: Reported on 01/16/2017 04/30/13   Teressa Lower, MD    Physical Exam: Vitals:   04/03/17 0723 04/03/17 0745 04/03/17 0800 04/03/17 1012  BP:   117/78 119/74  Pulse:  72 66 76  Resp:   16 20  Temp:      TempSrc:      SpO2:  100% 90% 92%  Weight: 77.1 kg (170 lb)       Constitutional: NAD, calm, comfortable Eyes: PERRL, lids and conjunctivae normal ENMT: Mucous membranes are moist. Posterior pharynx clear of any exudate or lesions. Neck: normal, supple, no masses, no thyromegaly Respiratory: clear to auscultation bilaterally, no wheezing, no crackles. Normal respiratory effort. No accessory muscle use.  Cardiovascular: Regular rate and rhythm, no murmurs / rubs / gallops. Positive  shoulder, supraclavicular, LUE extremity edema. 2+ pedal pulses. No carotid bruits.  Abdomen: no tenderness, no masses palpated. No hepatosplenomegaly. Bowel sounds positive.  Musculoskeletal: no clubbing / cyanosis. Good ROM, no contractures. Normal muscle tone.  Skin: no rashes, lesions, ulcers on limited skin exam. Neurologic: CN 2-12 grossly intact. Sensation intact, DTR normal. Strength 5/5 in all 4.  Psychiatric: Normal judgment and insight. Alert and oriented x 4. Normal mood.    Labs on Admission: I have personally reviewed following labs and imaging studies  CBC:  Recent Labs Lab 04/03/17 0224  WBC 10.7*  NEUTROABS 6.0  HGB 16.2*  HCT 44.5  MCV 90.3  PLT 761   Basic Metabolic Panel:  Recent Labs Lab 04/03/17 0224 04/03/17 0800  NA 141  --   K 3.5  --   CL 109  --   CO2 22  --   GLUCOSE 99  --   BUN 17  --   CREATININE 0.96  --   CALCIUM 9.4  --   MG  --  2.1   GFR: Estimated Creatinine Clearance: 63.7 mL/min (by C-G formula based on SCr of 0.96 mg/dL). Liver Function Tests:  Recent Labs Lab 04/03/17 0224  AST 23  ALT 21  ALKPHOS 68  BILITOT 0.9  PROT 7.7  ALBUMIN 4.3   No results for input(s): LIPASE, AMYLASE in the last 168  hours. No results for input(s): AMMONIA in the last 168 hours. Coagulation Profile:  Recent Labs Lab 04/03/17 0730  INR 1.00   Cardiac Enzymes: No results for input(s): CKTOTAL, CKMB, CKMBINDEX, TROPONINI in the last 168 hours. BNP (last 3 results) No results for input(s): PROBNP in the last 8760 hours. HbA1C: No results for input(s): HGBA1C in the last 72 hours. CBG: No results for input(s): GLUCAP in the last 168 hours. Lipid Profile: No results for input(s): CHOL, HDL, LDLCALC, TRIG, CHOLHDL, LDLDIRECT in the last 72 hours. Thyroid Function Tests: No results for input(s): TSH, T4TOTAL, FREET4, T3FREE, THYROIDAB in the last 72 hours. Anemia Panel: No results for input(s): VITAMINB12, FOLATE, FERRITIN, TIBC, IRON, RETICCTPCT in the last 72 hours. Urine analysis:    Component Value Date/Time   COLORURINE YELLOW  03/24/2009 0207   APPEARANCEUR TURBID (A) 03/24/2009 0207   LABSPEC 1.013 03/24/2009 0207   PHURINE 8.0 03/24/2009 0207   GLUCOSEU NEGATIVE 03/24/2009 0207   HGBUR TRACE (A) 03/24/2009 0207   BILIRUBINUR NEGATIVE 03/24/2009 0207   KETONESUR NEGATIVE 03/24/2009 0207   PROTEINUR NEGATIVE 03/24/2009 0207   UROBILINOGEN 0.2 03/24/2009 0207   NITRITE NEGATIVE 03/24/2009 0207   LEUKOCYTESUR NEGATIVE 03/24/2009 0207    Radiological Exams on Admission: Ct Soft Tissue Neck W Contrast  Result Date: 04/03/2017 CLINICAL DATA:  Pain and swelling. Jugular venous distention on the left. EXAM: CT NECK WITH CONTRAST TECHNIQUE: Multidetector CT imaging of the neck was performed using the standard protocol following the bolus administration of intravenous contrast. CONTRAST:  75 cc Isovue 300 intravenous COMPARISON:  None. FINDINGS: Pharynx and larynx: Normal. No mass or swelling. Salivary glands: No inflammation, mass, or stone. Thyroid: Normal. Lymph nodes: None enlarged or abnormal density. Vascular: The left upper extremity was injected. On the second acquisition acquired due to  motion there is a central filling defect within the lower internal jugular vein, brachiocephalic vein, distal EJ, and left subclavian vein. There is associated reticulation of fat in the left supraclavicular fossa and lower neck. There is associated reflux of contrast into patent veins reaching the left cavernous sinus. The visible, upper SVC is patent. Limited intracranial: Negative Visualized orbits: Negative Mastoids and visualized paranasal sinuses: Clear Skeleton: No acute or aggressive finding. Upper chest: Subpleural pulmonary nodule in the left upper lobe on average 5 mm in diameter. Other: Acute results were called by telephone at the time of interpretation on 04/03/2017 at 7:11 am to [facility] , who verbally acknowledged these results. IMPRESSION: 1. Deep venous thrombosis in the left subclavian, brachiocephalic, and lower jugular veins. If clinically helpful, duplex could determine the extent in the arm and be used for follow-up. 2. 5 mm pulmonary nodule in the left upper lobe. Non-contrast chest CT can be considered in 12 months in this smoker. This recommendation follows the consensus statement: Guidelines for Management of Incidental Pulmonary Nodules Detected on CT Images: From the Fleischner Society 2017; Radiology 2017; 284:228-243. Electronically Signed   By: Monte Fantasia M.D.   On: 04/03/2017 07:12    EKG: Independently reviewed.  Ordered.  Assessment/Plan Principal Problem:   Acute deep vein thrombosis (DVT) of brachial vein of left upper extremity (HCC) Admit to telemetry/inpatient. Continue heparin infusion per pharmacy. Briefly explained to the patient that she will need to be bridged to oral anticoagulant. Analgesics as needed.  Active Problems:   Chest pain Likely secondary to LUE thrombosis. Check EKG. Echocardiogram as needed.    HTN (hypertension) Continue amlodipine 5 mg by mouth daily. Monitor blood pressure.    Tobacco use disorder Declined nicotine  replacement therapy. Tobacco cessation information today provider.    Pulmonary nodule Will need CT scan and chest follow-up in a year. Also advised to cease smoking.   DVT prophylaxis: On heparin infusion. Code Status: Full code. Family Communication: Her son and mother were present in the ED. Disposition Plan: Admit for 2-3 days for heparin infusion therapy and oral anticoagulant bridging. Consults called:  Admission status: Inpatient/Telemetry.   Reubin Milan MD [facility] Pager [phone]  If 7PM-7AM, please contact night-coverage [email or web address] Password [email or web address]  04/03/2017, 10:15 AM

## 2017-04-04 DIAGNOSIS — I82622 Acute embolism and thrombosis of deep veins of left upper extremity: Secondary | ICD-10-CM | POA: Diagnosis not present

## 2017-04-04 DIAGNOSIS — I82409 Acute embolism and thrombosis of unspecified deep veins of unspecified lower extremity: Secondary | ICD-10-CM | POA: Diagnosis present

## 2017-04-04 LAB — BASIC METABOLIC PANEL
ANION GAP: 9 (ref 5–15)
BUN: 18 mg/dL (ref 6–20)
CHLORIDE: 108 mmol/L (ref 101–111)
CO2: 24 mmol/L (ref 22–32)
Calcium: 8.9 mg/dL (ref 8.9–10.3)
Creatinine, Ser: 1.04 mg/dL — ABNORMAL HIGH (ref 0.44–1.00)
GFR calc non Af Amer: 59 mL/min — ABNORMAL LOW (ref >60)
GLUCOSE: 115 mg/dL — AB (ref 65–99)
Potassium: 3.8 mmol/L (ref 3.5–5.1)
Sodium: 141 mmol/L (ref 135–145)

## 2017-04-04 LAB — CBC
HEMATOCRIT: 42.4 % (ref 36.0–46.0)
HEMOGLOBIN: 14.6 g/dL (ref 12.0–15.0)
MCH: 31.3 pg (ref 26.0–34.0)
MCHC: 34.4 g/dL (ref 30.0–36.0)
MCV: 91 fL (ref 78.0–100.0)
PLATELETS: 210 10*3/uL (ref 150–400)
RBC: 4.66 MIL/uL (ref 3.87–5.11)
RDW: 13.9 % (ref 11.5–15.5)
WBC: 9.9 10*3/uL (ref 4.0–10.5)

## 2017-04-04 LAB — MAGNESIUM: Magnesium: 1.9 mg/dL (ref 1.7–2.4)

## 2017-04-04 LAB — HEPARIN LEVEL (UNFRACTIONATED): Heparin Unfractionated: 0.61 IU/mL (ref 0.30–0.70)

## 2017-04-04 MED ORDER — CYCLOBENZAPRINE HCL 10 MG PO TABS: 10.0000 mg | ORAL_TABLET | Freq: Every day | ORAL | 0 refills | Status: DC | PRN

## 2017-04-04 MED ORDER — RIVAROXABAN (XARELTO) VTE STARTER PACK (15 & 20 MG)
ORAL_TABLET | ORAL | 0 refills | Status: DC
Start: 2017-04-04 — End: 2022-03-11

## 2017-04-04 MED ORDER — RIVAROXABAN (XARELTO) EDUCATION KIT FOR DVT/PE PATIENTS
PACK | Freq: Once | Status: DC
  Filled 2017-04-04: qty 1

## 2017-04-04 NOTE — Progress Notes (Signed)
ANTICOAGULATION CONSULT NOTE - Follow Up Consult  Pharmacy Consult for Heparin Indication: DVT  Allergies  Allergen Reactions  . Chlorhexidine Rash  . Other Itching    Transparent dressing causes itching per pt  . Latex Rash    Patient Measurements: Height: 5\' 2"  (157.5 cm) Weight: 166 lb 3.6 oz (75.4 kg) IBW/kg (Calculated) : 50.1 Heparin Dosing Weight:   Vital Signs: Temp: 98.1 F (36.7 C) (07/02 2243) Temp Source: Oral (07/02 2243) BP: 109/69 (07/02 2243) Pulse Rate: 86 (07/02 2243)  Labs:  Recent Labs  04/03/17 0224 04/03/17 0730 04/03/17 0800 04/03/17 1440 04/03/17 2245 04/04/17 0505  HGB 16.2*  --   --   --   --  14.6  HCT 44.5  --   --   --   --  42.4  PLT 239  --   --   --   --  210  APTT  --   --  28  --   --   --   LABPROT  --  13.1  --   --   --   --   INR  --  1.00  --   --   --   --   HEPARINUNFRC  --   --   --  0.91* 0.68  --   CREATININE 0.96  --   --   --   --  1.04*    Estimated Creatinine Clearance: 58.1 mL/min (A) (by C-G formula based on SCr of 1.04 mg/dL (H)).   Medications:  Infusions:  . sodium chloride 50 mL/hr at 04/04/17 0627  . heparin 850 Units/hr (04/04/17 0626)    Assessment: Patient with heparin level at goal.  No heparin issues noted.  Goal of Therapy:  Heparin level 0.3-0.7 units/ml Monitor platelets by anticoagulation protocol: Yes   Plan:  Continue heparin drip at current rate Recheck level with AM labs  Nani Skillern Crowford 04/04/2017,6:27 AM

## 2017-04-04 NOTE — Progress Notes (Signed)
Discharge instructions were gone over with patient and family. Prescriptions were given to patient. IV site was removed. All questions and concerns were answered. Patient wheeled down by staff member.

## 2017-04-04 NOTE — Discharge Summary (Signed)
Physician Discharge Summary  Jessica Vega XBL:[id number] DOB: 12/04/1960 DOA: 04/03/2017  PCP: Tennis Must, MD  Admit date: 04/03/2017 Discharge date: 04/04/2017  Time spent: 45 minutes  Recommendations for Outpatient Follow-up:  1. Started Xarelto this admit from Upper neck DVT--should be life-long 2. Rx Flexeril this admit 3. 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  allgenic bone marrow transplant 2016 + CMV/Asperigiluus + pericarditis after transplant Admitted for possible noncardiogenic chest pain  At that time had left chest and neck pain and CT showed atelectasis and no embolism and no other issue Anemia HTN  Admitted 04/03/2017 with right-sided discomfort and swelling-  CT scan showed left subclavian, brachiocephalic, jugular DVT  Started on IV heparin infusion on admission   Hospital Course:  Acute brachiocephalic DVT  Continue IV heparin and monitor  transition to a NOAC on d/c  Rx flexeril for spasm and pain given on d/c Needs likely lifelong NOAC-Xarelto started pack given this admit Case d/w Dr. 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  prior complication of post transplant pericarditis, Aspergillus, CMV now treated  Labs needed as outpatient  2.5 mm pulmonary nodule  Previous Aspergillus, needs further characterization by oncologist as outpatient  Continued tobacco abuse  Pre-contemplative, continue nicotine  patch  Hypertension  Amlodipine 5 will be held as blood pressure slightly low, resumed on d/c   Procedures: None   Consultations:  Oncologist  Discharge Exam: Vitals:   04/03/17 2243 04/04/17 0653  BP: 109/69 102/66  Pulse: 86 66  Resp: 18 18  Temp: 98.1 F (36.7 C) 97.6 F (36.4 C)    General: eomi ncat Cardiovascular: s1 s2 no m/r/g Respiratory: clear no added sound  Discharge Instructions   Discharge Instructions    Diet - low sodium heart healthy    Complete by:  As directed    Discharge instructions    Complete by:  As directed    Continue blood thinner chronically until you are seen by Oncologist   Increase activity slowly    Complete by:  As directed    PR TARGETED CASE MANAGEMENT    Complete by:  As directed    Need sXarelto pre-auth and vouchers     Current Discharge Medication List    START taking these medications   Details  Rivaroxaban 15 & 20 MG TBPK Take as directed on package: Start with one 15mg  tablet by mouth twice a day with food. On Day 22, switch to one 20mg  tablet once a day with food. Qty: 51 each, Refills: 0      CONTINUE these medications which have CHANGED   Details  cyclobenzaprine (FLEXERIL) 10 MG tablet Take 1 tablet (10 mg total) by mouth daily as needed for muscle spasms. Qty: 30 tablet, Refills: 0      CONTINUE these medications which have NOT CHANGED   Details  Albuterol Sulfate (PROAIR RESPICLICK) 300 (90 Base) MCG/ACT AEPB Inhale 2 puffs into the lungs  every 4 (four) hours as needed. Qty: 1 each, Refills: 2    amLODipine (NORVASC) 5 MG tablet Take 1 tablet (5 mg total) by mouth daily. Qty: 90 tablet, Refills: 3    aspirin 81 MG tablet Take 81 mg by mouth daily after breakfast.     cholecalciferol (VITAMIN D) 1000 UNITS tablet Take 1,000 Units by mouth daily after breakfast.     omeprazole (PRILOSEC) 20 MG capsule Take 20 mg by mouth 2 (two) times daily as needed (reflux).     potassium chloride SA  (K-DUR,KLOR-CON) 20 MEQ tablet Take 20 mEq by mouth 2 (two) times daily.     famotidine (PEPCID) 20 MG tablet Take 1 tablet (20 mg total) by mouth 2 (two) times daily. Qty: 30 tablet, Refills: 0       Allergies  Allergen Reactions  . Chlorhexidine Rash  . Other Itching    Transparent dressing causes itching per pt  . Latex Rash   Follow-up Information    McIver, Glorianne Manchester, DO. Go on 04/11/2017.   Specialty:  Hematology Why:  at 10:15am   For Post Hospitalization Follow Up, Bring Insurance & Photo ID, Take your Discharge Instruction to your appt Contact information: [place] [address] [phone]            The results of significant diagnostics from this hospitalization (including imaging, microbiology, ancillary and laboratory) are listed below for reference.    Significant Diagnostic Studies: Dg Ankle 2 Views Left  Result Date: 04/03/2017 CLINICAL DATA:  Left ankle pain x2 days EXAM: LEFT ANKLE - 2 VIEW COMPARISON:  None. FINDINGS: No fracture or dislocation is seen. The ankle mortise is intact. Visualized soft tissues are within normal limits. IMPRESSION: Negative. Electronically Signed   By: Julian Hy M.D.   On: 04/03/2017 10:33   Ct Soft Tissue Neck W Contrast  Result Date: 04/03/2017 CLINICAL DATA:  Pain and swelling. Jugular venous distention on the left. EXAM: CT NECK WITH CONTRAST TECHNIQUE: Multidetector CT imaging of the neck was performed using the standard protocol following the bolus administration of intravenous contrast. CONTRAST:  75 cc Isovue 300 intravenous COMPARISON:  None. FINDINGS: Pharynx and larynx: Normal. No mass or swelling. Salivary glands: No inflammation, mass, or stone. Thyroid: Normal. Lymph nodes: None enlarged or abnormal density. Vascular: The left upper extremity was injected. On the second acquisition acquired due to motion there is a central filling defect within the lower internal jugular vein,  brachiocephalic vein, distal EJ, and left subclavian vein. There is associated reticulation of fat in the left supraclavicular fossa and lower neck. There is associated reflux of contrast into patent veins reaching the left cavernous sinus. The visible, upper SVC is patent. Limited intracranial: Negative Visualized orbits: Negative Mastoids and visualized paranasal sinuses: Clear Skeleton: No acute or aggressive finding. Upper chest: Subpleural pulmonary nodule in the left upper lobe on average 5 mm in diameter. Other: Acute results were called by telephone at the time of interpretation on 04/03/2017 at 7:11 am to [facility] , who verbally acknowledged these results. IMPRESSION: 1. Deep venous thrombosis in the left subclavian, brachiocephalic, and lower jugular veins. If clinically helpful, duplex could determine the extent in the arm and be used for follow-up. 2. 5 mm pulmonary nodule in the left upper lobe. Non-contrast chest CT can be considered in 12 months in this smoker. This recommendation follows the consensus statement: Guidelines for Management of Incidental Pulmonary Nodules Detected on CT Images: From the Fleischner  Society 2017; Radiology 2017; [phone]. Electronically Signed   By: Monte Fantasia M.D.   On: 04/03/2017 07:12    Microbiology: No results found for this or any previous visit (from the past 240 hour(s)).   Labs: Basic Metabolic Panel:  Recent Labs Lab 04/03/17 0224 04/03/17 0800 04/04/17 0505  NA 141  --  141  K 3.5  --  3.8  CL 109  --  108  CO2 22  --  24  GLUCOSE 99  --  115*  BUN 17  --  18  CREATININE 0.96  --  1.04*  CALCIUM 9.4  --  8.9  MG  --  2.1 1.9   Liver Function Tests:  Recent Labs Lab 04/03/17 0224  AST 23  ALT 21  ALKPHOS 68  BILITOT 0.9  PROT 7.7  ALBUMIN 4.3   No results for input(s): LIPASE, AMYLASE in the last 168 hours. No results for input(s): AMMONIA in the last 168 hours. CBC:  Recent Labs Lab 04/03/17 0224 04/04/17 0505   WBC 10.7* 9.9  NEUTROABS 6.0  --   HGB 16.2* 14.6  HCT 44.5 42.4  MCV 90.3 91.0  PLT 239 210   Cardiac Enzymes: No results for input(s): CKTOTAL, CKMB, CKMBINDEX, TROPONINI in the last 168 hours. BNP: BNP (last 3 results) No results for input(s): BNP in the last 8760 hours.  ProBNP (last 3 results) No results for input(s): PROBNP in the last 8760 hours.  CBG: No results for input(s): GLUCAP in the last 168 hours.     SignedNita Sells MD   [facility] 04/04/2017, 2:17 PM

## 2017-04-04 NOTE — Progress Notes (Signed)
Xarelto 30 free trial card provided for pt. Marney Doctor RN,BSN,NCM [phone]

## 2017-04-04 NOTE — Progress Notes (Signed)
ANTICOAGULATION CONSULT NOTE - Follow Up Consult  Pharmacy Consult for heparin  Indication: new DVT  Allergies  Allergen Reactions  . Chlorhexidine Rash  . Other Itching    Transparent dressing causes itching per pt  . Latex Rash    Patient Measurements: Height: 5\' 2"  (157.5 cm) Weight: 166 lb 3.6 oz (75.4 kg) IBW/kg (Calculated) : 50.1 Heparin Dosing Weight: 66 kg  Vital Signs: Temp: 97.6 F (36.4 C) (07/03 0653) Temp Source: Oral (07/03 0653) BP: 102/66 (07/03 0653) Pulse Rate: 66 (07/03 0653)  Labs:  Recent Labs  04/03/17 0224 04/03/17 0730 04/03/17 0800 04/03/17 1440 04/03/17 2245 04/04/17 0505  HGB 16.2*  --   --   --   --  14.6  HCT 44.5  --   --   --   --  42.4  PLT 239  --   --   --   --  210  APTT  --   --  28  --   --   --   LABPROT  --  13.1  --   --   --   --   INR  --  1.00  --   --   --   --   HEPARINUNFRC  --   --   --  0.91* 0.68  --   CREATININE 0.96  --   --   --   --  1.04*    Estimated Creatinine Clearance: 58.1 mL/min (A) (by C-G formula based on SCr of 1.04 mg/dL (H)).  Assessment: Patient's a 56 y.o F with hx AML (s/p BM transplant) presented to the ED on 04/03/17 with c/o left neck swelling.  Neck CT on 04/03/17 showed DVT in the left subclavian, brachiocephalic, and lower jugular veins.  Heparin drip started on admission for new DVT.  Today, 04/04/2017: - heparin level is therapeutic at 0.61 - CBC stable - no bleeding documented   Goal of Therapy:  Heparin level 0.3-0.7 units/ml Monitor platelets by anticoagulation protocol: Yes   Plan:  - continue heparin drip at 850 units/hr - monitor for s/s bleeding  Genecis Veley P 04/04/2017,8:27 AM

## 2017-04-20 DIAGNOSIS — R5383 Other fatigue: Secondary | ICD-10-CM | POA: Diagnosis not present

## 2017-04-20 DIAGNOSIS — R0602 Shortness of breath: Secondary | ICD-10-CM | POA: Diagnosis not present

## 2017-04-20 DIAGNOSIS — R918 Other nonspecific abnormal finding of lung field: Secondary | ICD-10-CM | POA: Diagnosis not present

## 2017-04-20 DIAGNOSIS — R05 Cough: Secondary | ICD-10-CM | POA: Diagnosis not present

## 2017-07-18 DIAGNOSIS — F17200 Nicotine dependence, unspecified, uncomplicated: Secondary | ICD-10-CM | POA: Diagnosis not present

## 2017-07-18 DIAGNOSIS — J188 Other pneumonia, unspecified organism: Secondary | ICD-10-CM | POA: Diagnosis not present

## 2017-07-18 DIAGNOSIS — J479 Bronchiectasis, uncomplicated: Secondary | ICD-10-CM | POA: Diagnosis not present

## 2017-07-18 DIAGNOSIS — Z87891 Personal history of nicotine dependence: Secondary | ICD-10-CM | POA: Diagnosis not present

## 2017-07-18 DIAGNOSIS — R918 Other nonspecific abnormal finding of lung field: Secondary | ICD-10-CM | POA: Diagnosis not present

## 2017-07-18 DIAGNOSIS — Z23 Encounter for immunization: Secondary | ICD-10-CM | POA: Diagnosis not present

## 2017-07-18 DIAGNOSIS — J168 Pneumonia due to other specified infectious organisms: Secondary | ICD-10-CM | POA: Diagnosis not present

## 2017-07-18 DIAGNOSIS — B449 Aspergillosis, unspecified: Secondary | ICD-10-CM | POA: Diagnosis not present

## 2017-08-03 DIAGNOSIS — J479 Bronchiectasis, uncomplicated: Secondary | ICD-10-CM | POA: Diagnosis not present

## 2017-08-03 DIAGNOSIS — F17210 Nicotine dependence, cigarettes, uncomplicated: Secondary | ICD-10-CM | POA: Diagnosis not present

## 2017-08-03 DIAGNOSIS — R062 Wheezing: Secondary | ICD-10-CM | POA: Diagnosis not present

## 2017-11-15 DIAGNOSIS — C9590 Leukemia, unspecified not having achieved remission: Secondary | ICD-10-CM | POA: Diagnosis not present

## 2017-11-15 DIAGNOSIS — Z7901 Long term (current) use of anticoagulants: Secondary | ICD-10-CM | POA: Diagnosis not present

## 2017-11-15 DIAGNOSIS — E876 Hypokalemia: Secondary | ICD-10-CM | POA: Diagnosis not present

## 2017-11-15 DIAGNOSIS — I10 Essential (primary) hypertension: Secondary | ICD-10-CM | POA: Diagnosis not present

## 2017-12-08 ENCOUNTER — Inpatient Hospital Stay (HOSPITAL_COMMUNITY)
Admission: EM | Admit: 2017-12-08 | Discharge: 2017-12-11 | DRG: 193 | Disposition: A | Discharge status: Home / Self Care | Payer: Medicare Other | Attending: Internal Medicine | Admitting: Internal Medicine

## 2017-12-08 ENCOUNTER — Other Ambulatory Visit: Payer: Self-pay

## 2017-12-08 ENCOUNTER — Emergency Department (HOSPITAL_COMMUNITY): Payer: Medicare Other

## 2017-12-08 ENCOUNTER — Encounter (HOSPITAL_COMMUNITY): Payer: Self-pay | Admitting: Emergency Medicine

## 2017-12-08 DIAGNOSIS — Z91040 Latex allergy status: Secondary | ICD-10-CM

## 2017-12-08 DIAGNOSIS — Z90710 Acquired absence of both cervix and uterus: Secondary | ICD-10-CM

## 2017-12-08 DIAGNOSIS — D649 Anemia, unspecified: Secondary | ICD-10-CM | POA: Diagnosis present

## 2017-12-08 DIAGNOSIS — Z883 Allergy status to other anti-infective agents status: Secondary | ICD-10-CM

## 2017-12-08 DIAGNOSIS — J471 Bronchiectasis with (acute) exacerbation: Secondary | ICD-10-CM | POA: Diagnosis present

## 2017-12-08 DIAGNOSIS — J189 Pneumonia, unspecified organism: Principal | ICD-10-CM | POA: Diagnosis present

## 2017-12-08 DIAGNOSIS — R000 Tachycardia, unspecified: Secondary | ICD-10-CM | POA: Diagnosis not present

## 2017-12-08 DIAGNOSIS — Z79899 Other long term (current) drug therapy: Secondary | ICD-10-CM

## 2017-12-08 DIAGNOSIS — Z9481 Bone marrow transplant status: Secondary | ICD-10-CM

## 2017-12-08 DIAGNOSIS — I10 Essential (primary) hypertension: Secondary | ICD-10-CM | POA: Diagnosis present

## 2017-12-08 DIAGNOSIS — B9689 Other specified bacterial agents as the cause of diseases classified elsewhere: Secondary | ICD-10-CM | POA: Diagnosis present

## 2017-12-08 DIAGNOSIS — Z9109 Other allergy status, other than to drugs and biological substances: Secondary | ICD-10-CM

## 2017-12-08 DIAGNOSIS — E876 Hypokalemia: Secondary | ICD-10-CM | POA: Diagnosis present

## 2017-12-08 DIAGNOSIS — C9201 Acute myeloblastic leukemia, in remission: Secondary | ICD-10-CM | POA: Diagnosis present

## 2017-12-08 DIAGNOSIS — J470 Bronchiectasis with acute lower respiratory infection: Secondary | ICD-10-CM | POA: Diagnosis present

## 2017-12-08 DIAGNOSIS — Z7902 Long term (current) use of antithrombotics/antiplatelets: Secondary | ICD-10-CM

## 2017-12-08 DIAGNOSIS — Z8701 Personal history of pneumonia (recurrent): Secondary | ICD-10-CM

## 2017-12-08 DIAGNOSIS — Z86718 Personal history of other venous thrombosis and embolism: Secondary | ICD-10-CM

## 2017-12-08 DIAGNOSIS — J479 Bronchiectasis, uncomplicated: Secondary | ICD-10-CM

## 2017-12-08 DIAGNOSIS — B479 Mycetoma, unspecified: Secondary | ICD-10-CM | POA: Diagnosis present

## 2017-12-08 DIAGNOSIS — R079 Chest pain, unspecified: Secondary | ICD-10-CM | POA: Diagnosis not present

## 2017-12-08 DIAGNOSIS — I959 Hypotension, unspecified: Secondary | ICD-10-CM | POA: Diagnosis present

## 2017-12-08 DIAGNOSIS — R0602 Shortness of breath: Secondary | ICD-10-CM | POA: Diagnosis not present

## 2017-12-08 DIAGNOSIS — Z7901 Long term (current) use of anticoagulants: Secondary | ICD-10-CM

## 2017-12-08 DIAGNOSIS — F17210 Nicotine dependence, cigarettes, uncomplicated: Secondary | ICD-10-CM | POA: Diagnosis present

## 2017-12-08 DIAGNOSIS — J9601 Acute respiratory failure with hypoxia: Secondary | ICD-10-CM | POA: Diagnosis present

## 2017-12-08 DIAGNOSIS — I82409 Acute embolism and thrombosis of unspecified deep veins of unspecified lower extremity: Secondary | ICD-10-CM | POA: Diagnosis present

## 2017-12-08 DIAGNOSIS — Z9049 Acquired absence of other specified parts of digestive tract: Secondary | ICD-10-CM

## 2017-12-08 DIAGNOSIS — R7881 Bacteremia: Secondary | ICD-10-CM | POA: Diagnosis present

## 2017-12-08 LAB — BASIC METABOLIC PANEL
Anion gap: 13 (ref 5–15)
BUN: 15 mg/dL (ref 6–20)
CO2: 24 mmol/L (ref 22–32)
Calcium: 8.5 mg/dL — ABNORMAL LOW (ref 8.9–10.3)
Chloride: 101 mmol/L (ref 101–111)
Creatinine, Ser: 1.12 mg/dL — ABNORMAL HIGH (ref 0.44–1.00)
GFR calc Af Amer: >60 mL/min (ref >60)
GFR calc non Af Amer: 54 mL/min — ABNORMAL LOW (ref >60)
GLUCOSE: 104 mg/dL — AB (ref 65–99)
POTASSIUM: 3.6 mmol/L (ref 3.5–5.1)
Sodium: 138 mmol/L (ref 135–145)

## 2017-12-08 LAB — I-STAT TROPONIN, ED: Troponin i, poc: 0 ng/mL (ref 0.00–0.08)

## 2017-12-08 LAB — CBC
HEMATOCRIT: 43.9 % (ref 36.0–46.0)
Hemoglobin: 15.6 g/dL — ABNORMAL HIGH (ref 12.0–15.0)
MCH: 32.6 pg (ref 26.0–34.0)
MCHC: 35.5 g/dL (ref 30.0–36.0)
MCV: 91.6 fL (ref 78.0–100.0)
Platelets: 291 10*3/uL (ref 150–400)
RBC: 4.79 MIL/uL (ref 3.87–5.11)
RDW: 13.1 % (ref 11.5–15.5)
WBC: 15.9 10*3/uL — AB (ref 4.0–10.5)

## 2017-12-08 LAB — I-STAT BETA HCG BLOOD, ED (MC, WL, AP ONLY)

## 2017-12-08 LAB — I-STAT CG4 LACTIC ACID, ED: Lactic Acid, Venous: 1.64 mmol/L (ref 0.5–1.9)

## 2017-12-08 MED ORDER — ONDANSETRON HCL 4 MG/2ML IJ SOLN
4.0000 mg | Freq: Once | INTRAMUSCULAR | Status: AC
  Administered 2017-12-08: 4 mg via INTRAVENOUS
  Filled 2017-12-08: qty 2

## 2017-12-08 MED ORDER — ALBUTEROL SULFATE (2.5 MG/3ML) 0.083% IN NEBU
5.0000 mg | INHALATION_SOLUTION | Freq: Once | RESPIRATORY_TRACT | Status: AC
  Administered 2017-12-08: 5 mg via RESPIRATORY_TRACT
  Filled 2017-12-08: qty 6

## 2017-12-08 MED ORDER — SODIUM CHLORIDE 0.9 % IJ SOLN
INTRAMUSCULAR | Status: AC
  Administered 2017-12-09
  Filled 2017-12-08: qty 50

## 2017-12-08 MED ORDER — IOPAMIDOL (ISOVUE-370) INJECTION 76%
INTRAVENOUS | Status: AC
  Filled 2017-12-08: qty 100

## 2017-12-08 MED ORDER — IOPAMIDOL (ISOVUE-370) INJECTION 76%
100.0000 mL | Freq: Once | INTRAVENOUS | Status: AC | PRN
  Administered 2017-12-08: 100 mL via INTRAVENOUS

## 2017-12-08 MED ORDER — SODIUM CHLORIDE 0.9 % IV BOLUS (SEPSIS)
1000.0000 mL | Freq: Once | INTRAVENOUS | Status: AC
  Administered 2017-12-08: 1000 mL via INTRAVENOUS

## 2017-12-08 NOTE — ED Triage Notes (Signed)
Patient is complaining of SOB and mid chest pain. Patient is coughing up blood. Nurse seen on napkin after patient finishing coughing. Patient blood pressure and O2 SAT is low. Patient HR is high (look at vital signs). Patient states she has a hx of pneumonia.

## 2017-12-08 NOTE — ED Notes (Signed)
Patient put on 2L Okay. Patient O2 SAT at 93%

## 2017-12-08 NOTE — ED Notes (Signed)
Pt has purewick due to incontinence.

## 2017-12-09 ENCOUNTER — Encounter (HOSPITAL_COMMUNITY): Payer: Self-pay | Admitting: Internal Medicine

## 2017-12-09 DIAGNOSIS — Z86718 Personal history of other venous thrombosis and embolism: Secondary | ICD-10-CM | POA: Diagnosis not present

## 2017-12-09 DIAGNOSIS — Z7901 Long term (current) use of anticoagulants: Secondary | ICD-10-CM | POA: Diagnosis not present

## 2017-12-09 DIAGNOSIS — R0602 Shortness of breath: Secondary | ICD-10-CM | POA: Diagnosis not present

## 2017-12-09 DIAGNOSIS — C9201 Acute myeloblastic leukemia, in remission: Secondary | ICD-10-CM | POA: Diagnosis present

## 2017-12-09 DIAGNOSIS — Z91040 Latex allergy status: Secondary | ICD-10-CM | POA: Diagnosis not present

## 2017-12-09 DIAGNOSIS — I10 Essential (primary) hypertension: Secondary | ICD-10-CM | POA: Diagnosis present

## 2017-12-09 DIAGNOSIS — I959 Hypotension, unspecified: Secondary | ICD-10-CM | POA: Diagnosis present

## 2017-12-09 DIAGNOSIS — Z7902 Long term (current) use of antithrombotics/antiplatelets: Secondary | ICD-10-CM | POA: Diagnosis not present

## 2017-12-09 DIAGNOSIS — Z8701 Personal history of pneumonia (recurrent): Secondary | ICD-10-CM | POA: Diagnosis not present

## 2017-12-09 DIAGNOSIS — Z9481 Bone marrow transplant status: Secondary | ICD-10-CM | POA: Diagnosis not present

## 2017-12-09 DIAGNOSIS — R042 Hemoptysis: Secondary | ICD-10-CM | POA: Diagnosis not present

## 2017-12-09 DIAGNOSIS — J470 Bronchiectasis with acute lower respiratory infection: Secondary | ICD-10-CM

## 2017-12-09 DIAGNOSIS — E876 Hypokalemia: Secondary | ICD-10-CM | POA: Diagnosis present

## 2017-12-09 DIAGNOSIS — J479 Bronchiectasis, uncomplicated: Secondary | ICD-10-CM

## 2017-12-09 DIAGNOSIS — Z883 Allergy status to other anti-infective agents status: Secondary | ICD-10-CM | POA: Diagnosis not present

## 2017-12-09 DIAGNOSIS — B9689 Other specified bacterial agents as the cause of diseases classified elsewhere: Secondary | ICD-10-CM | POA: Diagnosis present

## 2017-12-09 DIAGNOSIS — R079 Chest pain, unspecified: Secondary | ICD-10-CM | POA: Diagnosis not present

## 2017-12-09 DIAGNOSIS — D649 Anemia, unspecified: Secondary | ICD-10-CM | POA: Diagnosis present

## 2017-12-09 DIAGNOSIS — J189 Pneumonia, unspecified organism: Principal | ICD-10-CM

## 2017-12-09 DIAGNOSIS — B479 Mycetoma, unspecified: Secondary | ICD-10-CM | POA: Diagnosis present

## 2017-12-09 DIAGNOSIS — Z79899 Other long term (current) drug therapy: Secondary | ICD-10-CM | POA: Diagnosis not present

## 2017-12-09 DIAGNOSIS — Z9049 Acquired absence of other specified parts of digestive tract: Secondary | ICD-10-CM | POA: Diagnosis not present

## 2017-12-09 DIAGNOSIS — J9601 Acute respiratory failure with hypoxia: Secondary | ICD-10-CM | POA: Diagnosis not present

## 2017-12-09 DIAGNOSIS — J471 Bronchiectasis with (acute) exacerbation: Secondary | ICD-10-CM | POA: Diagnosis present

## 2017-12-09 DIAGNOSIS — Z9109 Other allergy status, other than to drugs and biological substances: Secondary | ICD-10-CM | POA: Diagnosis not present

## 2017-12-09 DIAGNOSIS — Z90710 Acquired absence of both cervix and uterus: Secondary | ICD-10-CM | POA: Diagnosis not present

## 2017-12-09 DIAGNOSIS — R000 Tachycardia, unspecified: Secondary | ICD-10-CM | POA: Diagnosis not present

## 2017-12-09 DIAGNOSIS — R7881 Bacteremia: Secondary | ICD-10-CM | POA: Diagnosis present

## 2017-12-09 DIAGNOSIS — F17210 Nicotine dependence, cigarettes, uncomplicated: Secondary | ICD-10-CM | POA: Diagnosis present

## 2017-12-09 LAB — CBC
HCT: 43.4 % (ref 36.0–46.0)
HEMOGLOBIN: 14.8 g/dL (ref 12.0–15.0)
MCH: 31.7 pg (ref 26.0–34.0)
MCHC: 34.1 g/dL (ref 30.0–36.0)
MCV: 92.9 fL (ref 78.0–100.0)
Platelets: 279 10*3/uL (ref 150–400)
RBC: 4.67 MIL/uL (ref 3.87–5.11)
RDW: 13.3 % (ref 11.5–15.5)
WBC: 17.3 10*3/uL — ABNORMAL HIGH (ref 4.0–10.5)

## 2017-12-09 LAB — APTT: APTT: 57 s — AB (ref 24–36)

## 2017-12-09 LAB — GLUCOSE, CAPILLARY: GLUCOSE-CAPILLARY: 143 mg/dL — AB (ref 65–99)

## 2017-12-09 LAB — EXPECTORATED SPUTUM ASSESSMENT W GRAM STAIN, RFLX TO RESP C

## 2017-12-09 LAB — TYPE AND SCREEN
ABO/RH(D): O POS
Antibody Screen: NEGATIVE

## 2017-12-09 LAB — BASIC METABOLIC PANEL
ANION GAP: 12 (ref 5–15)
BUN: 13 mg/dL (ref 6–20)
CALCIUM: 8.1 mg/dL — AB (ref 8.9–10.3)
CHLORIDE: 103 mmol/L (ref 101–111)
CO2: 25 mmol/L (ref 22–32)
Creatinine, Ser: 1.05 mg/dL — ABNORMAL HIGH (ref 0.44–1.00)
GFR calc non Af Amer: 59 mL/min — ABNORMAL LOW (ref >60)
Glucose, Bld: 111 mg/dL — ABNORMAL HIGH (ref 65–99)
POTASSIUM: 3.3 mmol/L — AB (ref 3.5–5.1)
Sodium: 140 mmol/L (ref 135–145)

## 2017-12-09 LAB — TROPONIN I
Troponin I: <0.03 ng/mL (ref <0.03)
Troponin I: <0.03 ng/mL (ref <0.03)

## 2017-12-09 LAB — INFLUENZA PANEL BY PCR (TYPE A & B)
Influenza A By PCR: NEGATIVE
Influenza B By PCR: NEGATIVE

## 2017-12-09 MED ORDER — HEPARIN (PORCINE) IN NACL 100-0.45 UNIT/ML-% IJ SOLN
1100.0000 [IU]/h | INTRAMUSCULAR | Status: DC
  Administered 2017-12-09: 1100 [IU]/h via INTRAVENOUS
  Filled 2017-12-09: qty 250

## 2017-12-09 MED ORDER — BENZONATATE 100 MG PO CAPS
200.0000 mg | ORAL_CAPSULE | Freq: Two times a day (BID) | ORAL | Status: DC
  Administered 2017-12-09 – 2017-12-11 (×5): 200 mg via ORAL
  Filled 2017-12-09 (×5): qty 2

## 2017-12-09 MED ORDER — PANTOPRAZOLE SODIUM 40 MG PO TBEC
40.0000 mg | DELAYED_RELEASE_TABLET | Freq: Every day | ORAL | Status: DC
  Administered 2017-12-09 – 2017-12-11 (×3): 40 mg via ORAL
  Filled 2017-12-09 (×3): qty 1

## 2017-12-09 MED ORDER — LEVOFLOXACIN IN D5W 750 MG/150ML IV SOLN
750.0000 mg | INTRAVENOUS | Status: DC
  Administered 2017-12-09: 750 mg via INTRAVENOUS
  Filled 2017-12-09: qty 150

## 2017-12-09 MED ORDER — ALBUTEROL SULFATE (2.5 MG/3ML) 0.083% IN NEBU: 2.5000 mg | INHALATION_SOLUTION | Freq: Three times a day (TID) | RESPIRATORY_TRACT | Status: DC

## 2017-12-09 MED ORDER — ONDANSETRON HCL 4 MG/2ML IJ SOLN: 4.0000 mg | Freq: Four times a day (QID) | INTRAMUSCULAR | Status: DC | PRN

## 2017-12-09 MED ORDER — ALUM & MAG HYDROXIDE-SIMETH 200-200-20 MG/5ML PO SUSP
15.0000 mL | Freq: Four times a day (QID) | ORAL | Status: DC | PRN
  Administered 2017-12-10: 15 mL via ORAL
  Filled 2017-12-09: qty 30

## 2017-12-09 MED ORDER — ACETAMINOPHEN 325 MG PO TABS: 650.0000 mg | ORAL_TABLET | Freq: Four times a day (QID) | ORAL | Status: DC | PRN

## 2017-12-09 MED ORDER — CYCLOBENZAPRINE HCL 10 MG PO TABS: 10.0000 mg | ORAL_TABLET | Freq: Every day | ORAL | Status: DC | PRN

## 2017-12-09 MED ORDER — IPRATROPIUM-ALBUTEROL 0.5-2.5 (3) MG/3ML IN SOLN
3.0000 mL | Freq: Four times a day (QID) | RESPIRATORY_TRACT | Status: DC
  Administered 2017-12-09 – 2017-12-10 (×3): 3 mL via RESPIRATORY_TRACT
  Filled 2017-12-09 (×4): qty 3

## 2017-12-09 MED ORDER — ALBUTEROL SULFATE (2.5 MG/3ML) 0.083% IN NEBU
2.5000 mg | INHALATION_SOLUTION | Freq: Four times a day (QID) | RESPIRATORY_TRACT | Status: DC
  Administered 2017-12-09: 2.5 mg via RESPIRATORY_TRACT
  Filled 2017-12-09: qty 3

## 2017-12-09 MED ORDER — NICOTINE 14 MG/24HR TD PT24
14.0000 mg | MEDICATED_PATCH | Freq: Every day | TRANSDERMAL | Status: DC
  Administered 2017-12-09 – 2017-12-11 (×3): 14 mg via TRANSDERMAL
  Filled 2017-12-09 (×3): qty 1

## 2017-12-09 MED ORDER — ACETAMINOPHEN 650 MG RE SUPP: 650.0000 mg | Freq: Four times a day (QID) | RECTAL | Status: DC | PRN

## 2017-12-09 MED ORDER — ALBUTEROL SULFATE (2.5 MG/3ML) 0.083% IN NEBU: 2.5000 mg | INHALATION_SOLUTION | Freq: Four times a day (QID) | RESPIRATORY_TRACT | Status: DC

## 2017-12-09 MED ORDER — SODIUM CHLORIDE 0.9 % IV SOLN
2.0000 g | Freq: Once | INTRAVENOUS | Status: AC
  Administered 2017-12-09: 2 g via INTRAVENOUS
  Filled 2017-12-09: qty 2

## 2017-12-09 MED ORDER — SODIUM CHLORIDE 0.9 % IV SOLN
INTRAVENOUS | Status: DC
  Administered 2017-12-09: 06:00:00 via INTRAVENOUS

## 2017-12-09 MED ORDER — ONDANSETRON HCL 4 MG PO TABS
4.0000 mg | ORAL_TABLET | Freq: Four times a day (QID) | ORAL | Status: DC | PRN
  Administered 2017-12-09: 4 mg via ORAL
  Filled 2017-12-09: qty 1

## 2017-12-09 MED ORDER — HYDRALAZINE HCL 20 MG/ML IJ SOLN: 5.0000 mg | INTRAMUSCULAR | Status: DC | PRN

## 2017-12-09 MED ORDER — ALBUTEROL SULFATE (2.5 MG/3ML) 0.083% IN NEBU
2.5000 mg | INHALATION_SOLUTION | RESPIRATORY_TRACT | Status: DC | PRN
  Administered 2017-12-10: 2.5 mg via RESPIRATORY_TRACT
  Filled 2017-12-09: qty 3

## 2017-12-09 MED ORDER — AZITHROMYCIN 500 MG IV SOLR
500.0000 mg | Freq: Once | INTRAVENOUS | Status: AC
  Administered 2017-12-09: 500 mg via INTRAVENOUS
  Filled 2017-12-09: qty 500

## 2017-12-09 MED ORDER — POTASSIUM CHLORIDE CRYS ER 20 MEQ PO TBCR
20.0000 meq | EXTENDED_RELEASE_TABLET | Freq: Three times a day (TID) | ORAL | Status: DC
  Administered 2017-12-09 – 2017-12-11 (×7): 20 meq via ORAL
  Filled 2017-12-09 (×7): qty 1

## 2017-12-09 NOTE — Progress Notes (Signed)
ANTICOAGULATION CONSULT NOTE - Initial Consult  Pharmacy Consult for IV heparin Indication: DVT  Allergies  Allergen Reactions  . Chlorhexidine Rash  . Other Itching    Transparent dressing causes itching per pt  . Latex Rash    Patient Measurements: Height: 5' 2.75" (159.4 cm) Weight: 160 lb (72.6 kg) IBW/kg (Calculated) : 51.83 Heparin Dosing Weight:   Vital Signs: Temp: 98.7 F (37.1 C) (03/09 0459) Temp Source: Oral (03/09 0459) BP: 113/70 (03/09 0459) Pulse Rate: 113 (03/09 0459)  Labs: Recent Labs    12/08/17 2231  HGB 15.6*  HCT 43.9  PLT 291  CREATININE 1.12*    Estimated Creatinine Clearance: 53.8 mL/min (A) (by C-G formula based on SCr of 1.12 mg/dL (H)).   Medical History: Past Medical History:  Diagnosis Date  . AML (acute myeloblastic leukemia) ([place])    s/p bone marrow transplant in 5379 complicated by CMV, colitis, and sepsis  . Anemia   . Hypertension     Medications:  Scheduled:  . albuterol  2.5 mg Nebulization Q6H  . pantoprazole  40 mg Oral Daily  . potassium chloride SA  20 mEq Oral TID    Assessment: Pharmacy is consulted to dose heparin in 57 yo female with a diagnosis of neck DVT. Pt is on Xarelto 20 mg PO daily PTA. LD of Xarelto was on 3/8 at 1700 per med rec.  Today, 12/09/17   Hgb 15.6, plt 291     Goal of Therapy:  Heparin level 0.3-0.7 units/ml Monitor platelets by anticoagulation protocol: Yes   Plan:   As LD of Xarelto was at 1700 on 3/8, Start heparin drip 24 hrs after Xarelto dose at 1700 on 3/9.   Start heparin at 1100 units/hr  APTT 6 hours after start of infusion  Orders placed for aptt, HL and CBC with AM labs on 3/10   Monitor for signs and symptoms of bleeding   Royetta Asal, PharmD, BCPS Pager [phone] 12/09/2017 6:10 AM

## 2017-12-09 NOTE — Consult Note (Signed)
PULMONARY / CRITICAL CARE MEDICINE   Name: Jessica Vega MRN: [id number] DOB: 06/16/1961    ADMISSION DATE:  12/08/2017 CONSULTATION DATE:  12/09/2017  REFERRING MD:  Curly Rim MD  CHIEF COMPLAINT: Hemoptysis in the setting of aspergilloma, bronchiectasis  HISTORY OF PRESENT ILLNESS:   57 year old with history of AML, bronchiectasis, known right middle lobe aspergilloma, cavitary lesion presents with dyspnea, cough with flecks of blood tinged sputum which according to the patient is chronic.  Also has some mild chest discomfort CT angiogram shows no PE.  It redemonstrates known bronchiectasis and fungal stoma with mild right upper lobe groundglass opacity.  She is being followed at [facility] and underwent bronchoscopy on 08/29/17 and was treated for Pseudomonas.  PAST MEDICAL HISTORY :  She  has a past medical history of AML (acute myeloblastic leukemia) ([place]), Anemia, and Hypertension.  PAST SURGICAL HISTORY: She  has a past surgical history that includes Abdominal hysterectomy and Cholecystectomy.  Allergies  Allergen Reactions  . Chlorhexidine Rash  . Other Itching    Transparent dressing causes itching per pt  . Latex Rash    No current facility-administered medications on file prior to encounter.    Current Outpatient Medications on File Prior to Encounter  Medication Sig  . Albuterol Sulfate (PROAIR RESPICLICK) 956 (90 Base) MCG/ACT AEPB Inhale 2 puffs into the lungs every 4 (four) hours as needed. (Patient taking differently: Inhale 2 puffs into the lungs every 4 (four) hours as needed (SOB, wheezing). )  . amLODipine (NORVASC) 5 MG tablet Take 1 tablet (5 mg total) by mouth daily. (Patient taking differently: Take 5 mg by mouth daily after breakfast. )  . cholecalciferol (VITAMIN D) 1000 UNITS tablet Take 1,000 Units by mouth daily after breakfast.   . cyclobenzaprine (FLEXERIL) 10 MG tablet Take 1 tablet (10 mg total) by mouth daily as needed for  muscle spasms.  Marland Kitchen omeprazole (PRILOSEC) 20 MG capsule Take 20 mg by mouth at bedtime.   . potassium chloride SA (K-DUR,KLOR-CON) 20 MEQ tablet Take 20 mEq by mouth 3 (three) times daily.   . Rivaroxaban 15 & 20 MG TBPK Take as directed on package: Start with one 15mg  tablet by mouth twice a day with food. On Day 22, switch to one 20mg  tablet once a day with food. (Patient taking differently: Take 20 mg by mouth daily. )  . famotidine (PEPCID) 20 MG tablet Take 1 tablet (20 mg total) by mouth 2 (two) times daily. (Patient not taking: Reported on 01/16/2017)    FAMILY HISTORY:  Her indicated that the status of her mother is unknown. She indicated that the status of her father is unknown.   SOCIAL HISTORY: She  reports that she has been smoking cigarettes.  She has been smoking about 1.00 pack per day. she has never used smokeless tobacco. She reports that she drinks about 0.6 oz of alcohol per week. She reports that she does not use drugs.  REVIEW OF SYSTEMS:  All negative; except for those that are bolded, which indicate positives.  Constitutional: weight loss, weight gain, night sweats, fevers, chills, fatigue, weakness.  HEENT: headaches, sore throat, sneezing, nasal congestion, post nasal drip, difficulty swallowing, tooth/dental problems, visual complaints, visual changes, ear aches. Neuro: difficulty with speech, weakness, numbness, ataxia. CV:  chest pain, orthopnea, PND, swelling in lower extremities, dizziness, palpitations, syncope.  Resp: cough, hemoptysis, dyspnea, wheezing. GI: heartburn, indigestion, abdominal pain, nausea, vomiting, diarrhea, constipation, change in bowel habits, loss of appetite, hematemesis,  melena, hematochezia.  GU: dysuria, change in color of urine, urgency or frequency, flank pain, hematuria. MSK: joint pain or swelling, decreased range of motion. Psych: change in mood or affect, depression, anxiety, suicidal ideations, homicidal ideations. Skin: rash,  itching, bruising.  SUBJECTIVE:    VITAL SIGNS: BP 113/70 (BP Location: Left Arm)   Pulse (!) 113   Temp 98.7 F (37.1 C) (Oral)   Resp (!) 30   Ht 5' 2.75" (1.594 m)   Wt 160 lb (72.6 kg)   SpO2 94%   BMI 28.57 kg/m   HEMODYNAMICS:    VENTILATOR SETTINGS:    INTAKE / OUTPUT: I/O last 3 completed shifts: In: 1350 [IV Piggyback:1350] Out: -   PHYSICAL EXAMINATION: Blood pressure 113/70, pulse (!) 113, temperature 98.7 F (37.1 C), temperature source Oral, resp. rate (!) 30, height 5' 2.75" (1.594 m), weight 160 lb (72.6 kg), SpO2 94 %. Gen:      No acute distress HEENT:  EOMI, sclera anicteric Neck:     No masses; no thyromegaly Lungs:    Scattered expiratory wheeze.; normal respiratory effort CV:         Regular rate and rhythm; no murmurs Abd:      + bowel sounds; soft, non-tender; no palpable masses, no distension Ext:    No edema; adequate peripheral perfusion Skin:      Warm and dry; no rash Neuro: alert and oriented x 3 Psych: normal mood and affect  LABS:  BMET Recent Labs  Lab 12/08/17 2231 12/09/17 0539  NA 138 140  K 3.6 3.3*  CL 101 103  CO2 24 25  BUN 15 13  CREATININE 1.12* 1.05*  GLUCOSE 104* 111*    Electrolytes Recent Labs  Lab 12/08/17 2231 12/09/17 0539  CALCIUM 8.5* 8.1*    CBC Recent Labs  Lab 12/08/17 2231 12/09/17 0539  WBC 15.9* 17.3*  HGB 15.6* 14.8  HCT 43.9 43.4  PLT 291 279    Coag's No results for input(s): APTT, INR in the last 168 hours.  Sepsis Markers Recent Labs  Lab 12/08/17 2242  LATICACIDVEN 1.64    ABG No results for input(s): PHART, PCO2ART, PO2ART in the last 168 hours.  Liver Enzymes No results for input(s): AST, ALT, ALKPHOS, BILITOT, ALBUMIN in the last 168 hours.  Cardiac Enzymes Recent Labs  Lab 12/09/17 0705  TROPONINI <0.03    Glucose Recent Labs  Lab 12/09/17 0831  GLUCAP 143*    Imaging Dg Chest 2 View  Result Date: 12/08/2017 CLINICAL DATA:  Shortness of breath,  sternal chest pain and fever for 2 days. History of smoking, bronchitis, pneumonia. EXAM: CHEST - 2 VIEW COMPARISON:  Chest CT January 16, 2017 FINDINGS: Cardiomediastinal silhouette is normal in size. Mildly elevated RIGHT hemidiaphragm with bibasilar strandy densities. No pleural effusion or focal consolidation. No pneumothorax. Irregular sclerosis bilateral humeral greater tuberosities. Surgical clips in the included right abdomen compatible with cholecystectomy. IMPRESSION: Bibasilar atelectasis/scarring. Electronically Signed   By: Elon Alas M.D.   On: 12/08/2017 22:27   Ct Angio Chest Pe W And/or Wo Contrast  Result Date: 12/09/2017 CLINICAL DATA:  Acute onset of shortness of breath and chest pain. Hemoptysis. Decreased O2 saturation and hypotension. Tachycardia. EXAM: CT ANGIOGRAPHY CHEST WITH CONTRAST TECHNIQUE: Multidetector CT imaging of the chest was performed using the standard protocol during bolus administration of intravenous contrast. Multiplanar CT image reconstructions and MIPs were obtained to evaluate the vascular anatomy. CONTRAST:  100 mL ISOVUE-370 IOPAMIDOL (ISOVUE-370) INJECTION 76%  COMPARISON:  CTA of the chest performed 01/16/2017 FINDINGS: Cardiovascular:  There is no definite evidence of pulmonary embolus. The heart is normal in size. The thoracic aorta is grossly unremarkable. The great vessels are within normal limits. Mediastinum/Nodes: There is prominence of precarinal and aortopulmonary window nodes to 1.3 cm in short axis. No additional mediastinal lymphadenopathy is seen. No pericardial effusion is identified. The thyroid gland is unremarkable. No axillary lymphadenopathy is seen. Lungs/Pleura: There appears to be a markedly dilated bronchus to the right middle lobe, with associated peribronchovascular scarring. A central soft tissue density may reflect a fungal ball. This is somewhat better characterized than on the prior study. Mild scarring is noted at the lung bases  bilaterally. Hazy ground-glass airspace opacification is seen at the lung apices, more prominent on the left. No pleural effusion or pneumothorax is seen. Upper Abdomen: A nonspecific 1.0 cm hypodensity is noted at the right hepatic dome. The visualized portions of the spleen, adrenal glands and kidneys are within normal limits. The patient is status post cholecystectomy, with clips noted at the gallbladder fossa. Musculoskeletal: No acute osseous abnormalities are identified. The visualized musculature is unremarkable in appearance. Review of the MIP images confirms the above findings. IMPRESSION: 1. No definite evidence of pulmonary embolus. 2. Markedly dilated right middle lobe bronchus, with associated peribronchovascular scarring. Central soft tissue density may reflect a fungal ball, better characterized than on the prior study. This may explain the patient's hemoptysis. 3. Hazy ground-glass airspace opacification at the lung apices, more prominent on the left, concerning for atypical infection. 4. Prominent mediastinal nodes, measuring up to 1.3 cm in short axis. 5. Nonspecific 1.0 cm hypodensity at the hepatic dome is likely benign. Electronically Signed   By: Garald Balding M.D.   On: 12/09/2017 00:39     STUDIES:  [facility] scan from [facility] 04/20/17 Redemonstration of a right upper lobe endobronchial mass measuring 2.2 cm with associated surrounding bronchiectasis and parenchymal architectural distortion. This lesion has been minimally changed relative to CT imaging of the chest dating back to 2015, and it likely relates to chronic fungal infection.  PFTs [facility] Nov 2018: stable since 01/2016, Moderate restriction  Bronch results [facility] 08/29/17-[facility]B culture and smear negative, greater than 100,000 Pseudomonas Aspergillus antigen on BAL < 0.5 Fungal culture-moderate yeast Biopsy-some fungal hyphae, yeast.  Negative for malignancy.  CULTURES:   ANTIBIOTICS:   SIGNIFICANT  EVENTS:   LINES/TUBES:   DISCUSSION: 58 year old smoker with right middle lobe bronchiectasis, Pseudomonas infection, cavitary lesion with fungal ball,  presumed aspergilloma.  Admitted with acute bronchiectasis exacerbation, minor hemoptysis.  Bronchiectasis, mycetoma, hemoptysis Hemoptysis is not different from her baseline.  Continue to monitor. No indication for bronchoscopy at this point. Continue treatment with antibiotics. Not on antifungals   Nebs for bronchospasm. Tessalon for cough suppression Follow cultures  She can follow-up with her pulmonologist at [facility].  She has been told she may need to lung resection at some point but she wants to avoid this for now.  Active smoker She is working on smoking cessation  PCCM will be available as needed for this admission.  Marshell Garfinkel MD [facility] Pager [phone] If no answer or after 3pm call: [phone] 12/09/2017, 11:30 AM

## 2017-12-09 NOTE — H&P (Addendum)
History and Physical    Jessica Vega YFV:[id number] DOB: [birth year] DOA: 12/08/2017  PCP: Tennis Must, MD  Patient coming from: Home.  Chief Complaint: Shortness of breath.  HPI: Jessica Vega is a 57 y.o. female with history of AML in remission, bronchiectasis, history of right middle lobe fungal mycetoma and aspergillosis infection with history of DVT on Xarelto presents to the ER with complaints of increasing and worsening shortness of breath for last 5 days.  Patient also has been increasing sputum production more than baseline.  Also has blood tinged sputum which patient states is chronic.  To the ER physician patient suggested some chest discomfort though denies to me.  ED Course: In the ER patient was mildly hypotensive and hypoxic.  CT angiogram of the chest shows dilated right middle bronchus with fungal ball which may account for the hemoptysis.  There also was bilateral apical haziness concerning for atypical infection more on the left side.  Lymphadenopathy present.  Blood cultures were obtained.  Flu panel was negative.  Admitted for acute respiratory failure likely from worsening bronchiectasis possible atypical infection and fungal infection.  Review of Systems: As per HPI, rest all negative.   Past Medical History:  Diagnosis Date  . AML (acute myeloblastic leukemia) ([place])    s/p bone marrow transplant in 1638 complicated by CMV, colitis, and sepsis  . Anemia   . Hypertension     Past Surgical History:  Procedure Laterality Date  . ABDOMINAL HYSTERECTOMY    . CHOLECYSTECTOMY       reports that she has been smoking cigarettes.  She has been smoking about 1.00 pack per day. she has never used smokeless tobacco. She reports that she drinks about 0.6 oz of alcohol per week. She reports that she does not use drugs.  Allergies  Allergen Reactions  . Chlorhexidine Rash  . Other Itching    Transparent dressing causes itching per pt  . Latex Rash     Family History  Problem Relation Age of Onset  . CVA Mother   . Diabetes Mellitus II Father   . Renal Disease Father     Prior to Admission medications   Medication Sig Start Date End Date Taking? Authorizing Provider  Albuterol Sulfate (PROAIR RESPICLICK) 466 (90 Base) MCG/ACT AEPB Inhale 2 puffs into the lungs every 4 (four) hours as needed. Patient taking differently: Inhale 2 puffs into the lungs every 4 (four) hours as needed (SOB, wheezing).  09/27/16  Yes Robyn Haber, MD  amLODipine (NORVASC) 5 MG tablet Take 1 tablet (5 mg total) by mouth daily. Patient taking differently: Take 5 mg by mouth daily after breakfast.  09/27/16  Yes Robyn Haber, MD  cholecalciferol (VITAMIN D) 1000 UNITS tablet Take 1,000 Units by mouth daily after breakfast.    Yes [provider]  cyclobenzaprine (FLEXERIL) 10 MG tablet Take 1 tablet (10 mg total) by mouth daily as needed for muscle spasms. 04/04/17  Yes Nita Sells, MD  omeprazole (PRILOSEC) 20 MG capsule Take 20 mg by mouth at bedtime.  08/31/15  Yes [provider]  potassium chloride SA (K-DUR,KLOR-CON) 20 MEQ tablet Take 20 mEq by mouth 3 (three) times daily.    Yes [provider]  Rivaroxaban 15 & 20 MG TBPK Take as directed on package: Start with one 15mg  tablet by mouth twice a day with food. On Day 22, switch to one 20mg  tablet once a day with food. Patient taking differently: Take 20 mg by mouth  daily.  04/04/17  Yes Nita Sells, MD  famotidine (PEPCID) 20 MG tablet Take 1 tablet (20 mg total) by mouth 2 (two) times daily. Patient not taking: Reported on 01/16/2017 04/30/13   Teressa Lower, MD    Physical Exam: Vitals:   12/09/17 0030 12/09/17 0100 12/09/17 0200 12/09/17 0300  BP: 120/86 116/79 113/76 112/71  Pulse:  (!) 113 (!) 113 (!) 110  Resp: (!) 38 (!) 33 (!) 34 (!) 39  Temp:      TempSrc:      SpO2: 94% 100% 100% 100%  Weight:      Height:          Constitutional:  Moderately built and nourished. Vitals:   12/09/17 0030 12/09/17 0100 12/09/17 0200 12/09/17 0300  BP: 120/86 116/79 113/76 112/71  Pulse:  (!) 113 (!) 113 (!) 110  Resp: (!) 38 (!) 33 (!) 34 (!) 39  Temp:      TempSrc:      SpO2: 94% 100% 100% 100%  Weight:      Height:       Eyes: Anicteric no pallor. ENMT: No discharge from the ears eyes nose or mouth. Neck: No mass felt.  No neck rigidity. Respiratory: No rhonchi no crepitations. Cardiovascular: S1-S2 heard no murmurs appreciated. Abdomen: Soft nontender bowel sounds present. Musculoskeletal: No edema.  No joint effusion. Skin: No rash.  Skin appears warm. Neurologic: Alert awake oriented to time place and person.  Moves all extremities. Psychiatric: Appears normal.  Normal affect.   Labs on Admission: I have personally reviewed following labs and imaging studies  CBC: Recent Labs  Lab 12/08/17 2231  WBC 15.9*  HGB 15.6*  HCT 43.9  MCV 91.6  PLT 494   Basic Metabolic Panel: Recent Labs  Lab 12/08/17 2231  NA 138  K 3.6  CL 101  CO2 24  GLUCOSE 104*  BUN 15  CREATININE 1.12*  CALCIUM 8.5*   GFR: Estimated Creatinine Clearance: 53.8 mL/min (A) (by C-G formula based on SCr of 1.12 mg/dL (H)). Liver Function Tests: No results for input(s): AST, ALT, ALKPHOS, BILITOT, PROT, ALBUMIN in the last 168 hours. No results for input(s): LIPASE, AMYLASE in the last 168 hours. No results for input(s): AMMONIA in the last 168 hours. Coagulation Profile: No results for input(s): INR, PROTIME in the last 168 hours. Cardiac Enzymes: No results for input(s): CKTOTAL, CKMB, CKMBINDEX, TROPONINI in the last 168 hours. BNP (last 3 results) No results for input(s): PROBNP in the last 8760 hours. HbA1C: No results for input(s): HGBA1C in the last 72 hours. CBG: No results for input(s): GLUCAP in the last 168 hours. Lipid Profile: No results for input(s): CHOL, HDL, LDLCALC, TRIG, CHOLHDL, LDLDIRECT in the last 72  hours. Thyroid Function Tests: No results for input(s): TSH, T4TOTAL, FREET4, T3FREE, THYROIDAB in the last 72 hours. Anemia Panel: No results for input(s): VITAMINB12, FOLATE, FERRITIN, TIBC, IRON, RETICCTPCT in the last 72 hours. Urine analysis:    Component Value Date/Time   COLORURINE YELLOW 03/24/2009 0207   APPEARANCEUR TURBID (A) 03/24/2009 0207   LABSPEC 1.013 03/24/2009 0207   PHURINE 8.0 03/24/2009 0207   GLUCOSEU NEGATIVE 03/24/2009 0207   HGBUR TRACE (A) 03/24/2009 0207   BILIRUBINUR NEGATIVE 03/24/2009 0207   KETONESUR NEGATIVE 03/24/2009 0207   PROTEINUR NEGATIVE 03/24/2009 0207   UROBILINOGEN 0.2 03/24/2009 0207   NITRITE NEGATIVE 03/24/2009 0207   LEUKOCYTESUR NEGATIVE 03/24/2009 0207   Sepsis Labs: @LABRCNTIP (procalcitonin:4,lacticidven:4) )No results found for this or any  previous visit (from the past 240 hour(s)).   Radiological Exams on Admission: Dg Chest 2 View  Result Date: 12/08/2017 CLINICAL DATA:  Shortness of breath, sternal chest pain and fever for 2 days. History of smoking, bronchitis, pneumonia. EXAM: CHEST - 2 VIEW COMPARISON:  Chest CT January 16, 2017 FINDINGS: Cardiomediastinal silhouette is normal in size. Mildly elevated RIGHT hemidiaphragm with bibasilar strandy densities. No pleural effusion or focal consolidation. No pneumothorax. Irregular sclerosis bilateral humeral greater tuberosities. Surgical clips in the included right abdomen compatible with cholecystectomy. IMPRESSION: Bibasilar atelectasis/scarring. Electronically Signed   By: Elon Alas M.D.   On: 12/08/2017 22:27   Ct Angio Chest Pe W And/or Wo Contrast  Result Date: 12/09/2017 CLINICAL DATA:  Acute onset of shortness of breath and chest pain. Hemoptysis. Decreased O2 saturation and hypotension. Tachycardia. EXAM: CT ANGIOGRAPHY CHEST WITH CONTRAST TECHNIQUE: Multidetector CT imaging of the chest was performed using the standard protocol during bolus administration of intravenous  contrast. Multiplanar CT image reconstructions and MIPs were obtained to evaluate the vascular anatomy. CONTRAST:  100 mL ISOVUE-370 IOPAMIDOL (ISOVUE-370) INJECTION 76% COMPARISON:  CTA of the chest performed 01/16/2017 FINDINGS: Cardiovascular:  There is no definite evidence of pulmonary embolus. The heart is normal in size. The thoracic aorta is grossly unremarkable. The great vessels are within normal limits. Mediastinum/Nodes: There is prominence of precarinal and aortopulmonary window nodes to 1.3 cm in short axis. No additional mediastinal lymphadenopathy is seen. No pericardial effusion is identified. The thyroid gland is unremarkable. No axillary lymphadenopathy is seen. Lungs/Pleura: There appears to be a markedly dilated bronchus to the right middle lobe, with associated peribronchovascular scarring. A central soft tissue density may reflect a fungal ball. This is somewhat better characterized than on the prior study. Mild scarring is noted at the lung bases bilaterally. Hazy ground-glass airspace opacification is seen at the lung apices, more prominent on the left. No pleural effusion or pneumothorax is seen. Upper Abdomen: A nonspecific 1.0 cm hypodensity is noted at the right hepatic dome. The visualized portions of the spleen, adrenal glands and kidneys are within normal limits. The patient is status post cholecystectomy, with clips noted at the gallbladder fossa. Musculoskeletal: No acute osseous abnormalities are identified. The visualized musculature is unremarkable in appearance. Review of the MIP images confirms the above findings. IMPRESSION: 1. No definite evidence of pulmonary embolus. 2. Markedly dilated right middle lobe bronchus, with associated peribronchovascular scarring. Central soft tissue density may reflect a fungal ball, better characterized than on the prior study. This may explain the patient's hemoptysis. 3. Hazy ground-glass airspace opacification at the lung apices, more  prominent on the left, concerning for atypical infection. 4. Prominent mediastinal nodes, measuring up to 1.3 cm in short axis. 5. Nonspecific 1.0 cm hypodensity at the hepatic dome is likely benign. Electronically Signed   By: Garald Balding M.D.   On: 12/09/2017 00:39    EKG: Independently reviewed.  Sinus tachycardia.  Assessment/Plan Active Problems:   Acute myeloid leukemia in remission (HCC)   HTN (hypertension)   DVT (deep venous thrombosis) (HCC)   Acute respiratory failure with hypoxia (HCC)   Bronchiectasis ([place])    1. Acute respiratory failure with hypoxia -likely a combination of atypical infection worsening of bronchiectasis.  Patient also has a fungal ball and history of aspergillosis.  I have consulted pulmonary critical care for further recommendations.  Patient is on a Levaquin for now sputum cultures have been ordered.  Follow blood cultures. 2. Hemoptysis with history of  right middle lobe mycetoma -will await pulmonary critical care recommendations.  I am holding off patient's Xarelto and keeping patient on heparin and will closely follow CBC.  As per the patient, pulmonologist at [facility] with home patient follows is awaiting possible surgery once patient quit smoking. 3. History of DVT on Xarelto -Xarelto on hold on heparin in case patient needs procedures or if hemoptysis worsens. 4. Tobacco abuse patient advised to quit smoking.  On nicotine patch. 5. History of AML in remission. 6. Hypertension -holding antihypertensives due to low normal blood pressure will keep patient on PRN IV hydralazine for systolic blood pressure more than 160.   DVT prophylaxis: Heparin. Code Status: Full code. Family Communication: Discussed with patient. Disposition Plan: Home. Consults called: Pulmonary critical care. Admission status: Inpatient.   Rise Patience MD [facility] Pager [phone].  If 7PM-7AM, please contact night-coverage [email or web address] Password  [email or web address]  12/09/2017, 4:32 AM

## 2017-12-09 NOTE — ED Provider Notes (Signed)
[facility] DEPT Provider Note   CSN: [id number] Arrival date & time: 12/08/17  2119     History   Chief Complaint Chief Complaint  Patient presents with  . Shortness of Breath  . Chest Pain    HPI Jessica Vega is a 57 y.o. female.  HPI Patient presents with shortness of breath and chest pain.  Has been coughing for the last couple days.  States she feels as if she has a pneumonia.  States she frequently gets it.  Has a history of Aspergillus pneumonia.  Also has had Pseudomonas.  Has began to cough up blood.  She is on Xarelto chronically for neck DVT.  Has had chills.  No abdominal pain.  No other bleeding.  Initially hypoxic tachycardic and hypotensive. Past Medical History:  Diagnosis Date  . AML (acute myeloblastic leukemia) ([place])    s/p bone marrow transplant in 0623 complicated by CMV, colitis, and sepsis  . Anemia   . Hypertension     Patient Active Problem List   Diagnosis Date Noted  . DVT (deep venous thrombosis) ([place]) 04/04/2017  . Acute deep vein thrombosis (DVT) of brachial vein of left upper extremity ([place]) 04/03/2017  . Tobacco use disorder 04/03/2017  . Pulmonary nodule 04/03/2017  . Chest pain 01/16/2017  . Dyspnea 01/16/2017  . Acute myeloid leukemia in remission ([place]) 01/16/2017  . History of pericarditis 01/16/2017  . HTN (hypertension) 01/16/2017  . Atypical chest pain   . Atrial flutter ([place])   . Other pancytopenia ([place]) 04/30/2013    Past Surgical History:  Procedure Laterality Date  . ABDOMINAL HYSTERECTOMY    . CHOLECYSTECTOMY      OB History    No data available       Home Medications    Prior to Admission medications   Medication Sig Start Date End Date Taking? Authorizing Provider  Albuterol Sulfate (PROAIR RESPICLICK) 762 (90 Base) MCG/ACT AEPB Inhale 2 puffs into the lungs every 4 (four) hours as needed. Patient taking differently: Inhale 2 puffs into the lungs every 4 (four) hours as  needed (SOB, wheezing).  09/27/16  Yes Robyn Haber, MD  amLODipine (NORVASC) 5 MG tablet Take 1 tablet (5 mg total) by mouth daily. Patient taking differently: Take 5 mg by mouth daily after breakfast.  09/27/16  Yes Robyn Haber, MD  cholecalciferol (VITAMIN D) 1000 UNITS tablet Take 1,000 Units by mouth daily after breakfast.    Yes [provider]  cyclobenzaprine (FLEXERIL) 10 MG tablet Take 1 tablet (10 mg total) by mouth daily as needed for muscle spasms. 04/04/17  Yes Nita Sells, MD  omeprazole (PRILOSEC) 20 MG capsule Take 20 mg by mouth at bedtime.  08/31/15  Yes [provider]  potassium chloride SA (K-DUR,KLOR-CON) 20 MEQ tablet Take 20 mEq by mouth 3 (three) times daily.    Yes [provider]  Rivaroxaban 15 & 20 MG TBPK Take as directed on package: Start with one 15mg  tablet by mouth twice a day with food. On Day 22, switch to one 20mg  tablet once a day with food. Patient taking differently: Take 20 mg by mouth daily.  04/04/17  Yes Nita Sells, MD  famotidine (PEPCID) 20 MG tablet Take 1 tablet (20 mg total) by mouth 2 (two) times daily. Patient not taking: Reported on 01/16/2017 04/30/13   Teressa Lower, MD    Family History Family History  Problem Relation Age of Onset  . CVA Mother   . Diabetes  Mellitus II Father   . Renal Disease Father     Social History Social History   Tobacco Use  . Smoking status: Current Every Day Smoker    Packs/day: 1.00    Types: Cigarettes  . Smokeless tobacco: Never Used  Substance Use Topics  . Alcohol use: Yes    Alcohol/week: 0.6 oz    Types: 1 Glasses of wine per week    Comment: rarely  . Drug use: No     Allergies   Chlorhexidine; Other; and Latex   Review of Systems Review of Systems  Constitutional: Positive for chills. Negative for appetite change.  HENT: Negative for congestion.   Respiratory: Positive for cough and shortness of breath.        Hemoptysis    Cardiovascular: Negative for chest pain.  Gastrointestinal: Negative for abdominal pain.  Genitourinary: Negative for dysuria.  Musculoskeletal: Negative for back pain.  Neurological: Negative for tremors, weakness and numbness.  Psychiatric/Behavioral: Negative for confusion.     Physical Exam Updated Vital Signs BP 134/83   Pulse (!) 120   Temp 99.7 F (37.6 C) (Oral)   Resp (!) 43   Ht 5' 2.75" (1.594 m)   Wt 72.6 kg (160 lb)   SpO2 98%   BMI 28.57 kg/m   Physical Exam  Constitutional: She appears well-developed.  HENT:  Head: Atraumatic.  Eyes: Pupils are equal, round, and reactive to light.  Neck: Neck supple.  Cardiovascular:  Tachycardia.  Pulmonary/Chest: No accessory muscle usage.  Mildly diffuse harsh breath sounds.  Mild tachypnea  Abdominal: Soft. There is no tenderness.  Musculoskeletal:       Right lower leg: She exhibits no edema.       Left lower leg: She exhibits no edema.  Neurological: She is alert.  Skin: Capillary refill takes less than 2 seconds. No rash noted.  Psychiatric: She has a normal mood and affect.     ED Treatments / Results  Labs (all labs ordered are listed, but only abnormal results are displayed) Labs Reviewed  BASIC METABOLIC PANEL - Abnormal; Notable for the following components:      Result Value   Glucose, Bld 104 (*)    Creatinine, Ser 1.12 (*)    Calcium 8.5 (*)    GFR calc non Af Amer 54 (*)    All other components within normal limits  CBC - Abnormal; Notable for the following components:   WBC 15.9 (*)    Hemoglobin 15.6 (*)    All other components within normal limits  CULTURE, BLOOD (ROUTINE X 2)  CULTURE, BLOOD (ROUTINE X 2)  I-STAT TROPONIN, ED  I-STAT BETA HCG BLOOD, ED ([facility], [facility], [facility] ONLY)  I-STAT CG4 LACTIC ACID, ED    EKG  EKG Interpretation  Date/Time:  Friday December 08 2017 21:37:36 EST Ventricular Rate:  113 PR Interval:    QRS Duration: 91 QT Interval:  306 QTC Calculation: 420 R  Axis:   -23 Text Interpretation:  Sinus tachycardia Probable left atrial enlargement Borderline left axis deviation Low voltage, precordial leads RSR' in V1 or V2, probably normal variant Confirmed by Davonna Belling [phone]) on 12/08/2017 10:04:10 PM       Radiology Dg Chest 2 View  Result Date: 12/08/2017 CLINICAL DATA:  Shortness of breath, sternal chest pain and fever for 2 days. History of smoking, bronchitis, pneumonia. EXAM: CHEST - 2 VIEW COMPARISON:  Chest CT January 16, 2017 FINDINGS: Cardiomediastinal silhouette is normal in size. Mildly elevated RIGHT  hemidiaphragm with bibasilar strandy densities. No pleural effusion or focal consolidation. No pneumothorax. Irregular sclerosis bilateral humeral greater tuberosities. Surgical clips in the included right abdomen compatible with cholecystectomy. IMPRESSION: Bibasilar atelectasis/scarring. Electronically Signed   By: Elon Alas M.D.   On: 12/08/2017 22:27    Procedures Procedures (including critical care time)  Medications Ordered in ED Medications  albuterol (PROVENTIL) (2.5 MG/3ML) 0.083% nebulizer solution 5 mg (5 mg Nebulization Given 12/08/17 2246)  sodium chloride 0.9 % bolus 1,000 mL (0 mLs Intravenous Stopped 12/09/17 0029)  ondansetron (ZOFRAN) injection 4 mg (4 mg Intravenous Given 12/08/17 2342)  sodium chloride 0.9 % injection (  Given by Other 12/09/17 0000)  iopamidol (ISOVUE-370) 76 % injection 100 mL (100 mLs Intravenous Contrast Given 12/08/17 2350)     Initial Impression / Assessment and Plan / ED Course  I have reviewed the triage vital signs and the nursing notes.  Pertinent labs & imaging results that were available during my care of the patient were reviewed by me and considered in my medical decision making (see chart for details).     Patient with shortness of breath.  Hemoptysis.  But has improved with nasal cannula.  On was hypoxic and ambulation for DVT.  CT scan pending but x-ray reassuring.  Will admit  to hospitalist.  Final Clinical Impressions(s) / ED Diagnoses   Final diagnoses:  Recurrent pneumonia    ED Discharge Orders    None       Davonna Belling, MD 12/09/17 [phone]

## 2017-12-09 NOTE — ED Notes (Signed)
Handoff report called to [employer].

## 2017-12-09 NOTE — Progress Notes (Signed)
ANTICOAGULATION CONSULT NOTE - Follow Up Consult  Pharmacy Consult for heparin Indication: hx DVT  Allergies  Allergen Reactions  . Chlorhexidine Rash  . Other Itching    Transparent dressing causes itching per pt  . Latex Rash    Patient Measurements: Height: 5' 2.75" (159.4 cm) Weight: 160 lb (72.6 kg) IBW/kg (Calculated) : 51.83 Heparin Dosing Weight: 67 kg  Vital Signs: Temp: 98.7 F (37.1 C) (03/09 2200) Temp Source: Oral (03/09 2200) BP: 103/66 (03/09 2200) Pulse Rate: 117 (03/09 2200)  Labs: Recent Labs    12/08/17 2231 12/09/17 0539 12/09/17 0705 12/09/17 1235 12/09/17 1855  HGB 15.6* 14.8  --   --   --   HCT 43.9 43.4  --   --   --   PLT 291 279  --   --   --   CREATININE 1.12* 1.05*  --   --   --   TROPONINI  --   --  <0.03 <0.03 <0.03    Estimated Creatinine Clearance: 57.4 mL/min (A) (by C-G formula based on SCr of 1.05 mg/dL (H)).   Medications:  Xarelto 20 mg daily PTA (last dose taken on 3/8 at 5p)  Assessment: Patient is a 57 y.o F with hx right middle lobe mycetoma and hx upper neck DVT on xarelto 20 mg daily PTA, presented to the ED on 12/09/17 with c/o SOB and hemoptysis.  Anticoagulation was transitioned to heparin drip on admission.  Today, 12/09/2017: - heparin level is elevated at 0.97 but this is likely from residual effect of xarelto; aPTT is slightly subtherapeutic at 57 - pulmonary team recom no bronchoscopy at this time  Goal of Therapy:  Heparin level 0.3-0.7 units/ml aPTT 66-102 seconds Monitor platelets by anticoagulation protocol: Yes   Plan:  - increase heparin drip to 1250 units/hr. - check 6 hr aPTT and heparin level - will adjust heparin drip based on aPTT level until aPTT and heparin levels correlate with each other, then switch to heparin level monitoring - monitor for s/s bleeding  Anneli Bing P 12/09/2017,11:16 PM

## 2017-12-09 NOTE — Progress Notes (Signed)
[facility] PROGRESS NOTE  Jessica Vega DXI:[id number] DOB: 1961/08/15 DOA: 12/08/2017  PCP: Tennis Must, MD  Brief History/Interval Summary: 57 year old African-American female with a past medical history of AML in remission, bronchiectasis, history of right middle lobe fungal mycetoma and aspergillosis infection, history of DVT diagnosed in July 2018 currently on Xarelto, followed by pulmonology at [facility] presented with increasing shortness of breath for a few days prior to admission.  She also had blood-tinged sputum.  CT scan showed findings suggestive of fungal ball in the right middle bronchus.  There was also some concern for atypical infection in the left side.  Patient was hospitalized and placed on antibiotics.  Reason for Visit: Atypical pneumonia  Consultants: Pulmonology  Procedures: None  Antibiotics: Levaquin  Subjective/Interval History: Patient states that she feels a little better this morning.  Continues to have a cough with yellowish expectoration.  Occasionally blood-tinged.  She showed me her sputum from this morning which did not have any blood in it.  Denies any chest pain.  ROS: Denies any nausea or vomiting  Objective:  Vital Signs  Vitals:   12/09/17 0300 12/09/17 0430 12/09/17 0459 12/09/17 0825  BP: 112/71 122/81 113/70   Pulse: (!) 110  (!) 113   Resp: (!) 39 (!) 35 (!) 30   Temp:   98.7 F (37.1 C)   TempSrc:   Oral   SpO2: 100% (!) 77% 94% 94%  Weight:      Height:        Intake/Output Summary (Last 24 hours) at 12/09/2017 1334 Last data filed at 12/09/2017 0321 Gross per 24 hour  Intake 1350 ml  Output -  Net 1350 ml   Filed Weights   12/08/17 2137  Weight: 72.6 kg (160 lb)    General appearance: alert, cooperative, appears stated age and no distress Head: Normocephalic, without obvious abnormality, atraumatic Resp: Coarse breath sounds bilaterally with few scattered wheezes.  Crackles in the  right side.  Normal effort at rest. Cardio: regular rate and rhythm, S1, S2 normal, no murmur, click, rub or gallop GI: soft, non-tender; bowel sounds normal; no masses,  no organomegaly Extremities: extremities normal, atraumatic, no cyanosis or edema Neurologic: No focal deficits  Lab Results:  Data Reviewed: I have personally reviewed following labs and imaging studies  CBC: Recent Labs  Lab 12/08/17 2231 12/09/17 0539  WBC 15.9* 17.3*  HGB 15.6* 14.8  HCT 43.9 43.4  MCV 91.6 92.9  PLT 291 767    Basic Metabolic Panel: Recent Labs  Lab 12/08/17 2231 12/09/17 0539  NA 138 140  K 3.6 3.3*  CL 101 103  CO2 24 25  GLUCOSE 104* 111*  BUN 15 13  CREATININE 1.12* 1.05*  CALCIUM 8.5* 8.1*    GFR: Estimated Creatinine Clearance: 57.4 mL/min (A) (by C-G formula based on SCr of 1.05 mg/dL (H)).  Cardiac Enzymes: Recent Labs  Lab 12/09/17 0705  TROPONINI <0.03    CBG: Recent Labs  Lab 12/09/17 0831  GLUCAP 143*     Recent Results (from the past 240 hour(s))  Culture, sputum-assessment     Status: None   Collection Time: 12/09/17  4:29 AM  Result Value Ref Range Status   Specimen Description EXPECTORATED SPUTUM  Final   Special Requests NONE  Final   Sputum evaluation   Final    THIS SPECIMEN IS ACCEPTABLE FOR SPUTUM CULTURE Performed at [facility], [place] [address] [place], [place] [address]  Report Status 12/09/2017 FINAL  Final  Culture, respiratory (NON-Expectorated)     Status: None (Preliminary result)   Collection Time: 12/09/17  4:29 AM  Result Value Ref Range Status   Specimen Description   Final    EXPECTORATED SPUTUM Performed at [facility] [address], [place], [place] [address]    Special Requests   Final    NONE Reflexed from [id number] Performed at [facility], [place] [address] [place], [place] [address]    Gram Stain   Final    FEW WBC PRESENT,BOTH PMN AND  MONONUCLEAR RARE SQUAMOUS EPITHELIAL CELLS PRESENT FEW GRAM POSITIVE COCCI IN PAIRS FEW GRAM NEGATIVE COCCOBACILLI RARE GRAM POSITIVE RODS Performed at [facility], [place] [address], [place], [place] [address]    Culture PENDING  Incomplete   Report Status PENDING  Incomplete      Radiology Studies: Dg Chest 2 View  Result Date: 12/08/2017 CLINICAL DATA:  Shortness of breath, sternal chest pain and fever for 2 days. History of smoking, bronchitis, pneumonia. EXAM: CHEST - 2 VIEW COMPARISON:  Chest CT January 16, 2017 FINDINGS: Cardiomediastinal silhouette is normal in size. Mildly elevated RIGHT hemidiaphragm with bibasilar strandy densities. No pleural effusion or focal consolidation. No pneumothorax. Irregular sclerosis bilateral humeral greater tuberosities. Surgical clips in the included right abdomen compatible with cholecystectomy. IMPRESSION: Bibasilar atelectasis/scarring. Electronically Signed   By: Elon Alas M.D.   On: 12/08/2017 22:27   Ct Angio Chest Pe W And/or Wo Contrast  Result Date: 12/09/2017 CLINICAL DATA:  Acute onset of shortness of breath and chest pain. Hemoptysis. Decreased O2 saturation and hypotension. Tachycardia. EXAM: CT ANGIOGRAPHY CHEST WITH CONTRAST TECHNIQUE: Multidetector CT imaging of the chest was performed using the standard protocol during bolus administration of intravenous contrast. Multiplanar CT image reconstructions and MIPs were obtained to evaluate the vascular anatomy. CONTRAST:  100 mL ISOVUE-370 IOPAMIDOL (ISOVUE-370) INJECTION 76% COMPARISON:  CTA of the chest performed 01/16/2017 FINDINGS: Cardiovascular:  There is no definite evidence of pulmonary embolus. The heart is normal in size. The thoracic aorta is grossly unremarkable. The great vessels are within normal limits. Mediastinum/Nodes: There is prominence of precarinal and aortopulmonary window nodes to 1.3 cm in short axis. No additional mediastinal lymphadenopathy is seen. No  pericardial effusion is identified. The thyroid gland is unremarkable. No axillary lymphadenopathy is seen. Lungs/Pleura: There appears to be a markedly dilated bronchus to the right middle lobe, with associated peribronchovascular scarring. A central soft tissue density may reflect a fungal ball. This is somewhat better characterized than on the prior study. Mild scarring is noted at the lung bases bilaterally. Hazy ground-glass airspace opacification is seen at the lung apices, more prominent on the left. No pleural effusion or pneumothorax is seen. Upper Abdomen: A nonspecific 1.0 cm hypodensity is noted at the right hepatic dome. The visualized portions of the spleen, adrenal glands and kidneys are within normal limits. The patient is status post cholecystectomy, with clips noted at the gallbladder fossa. Musculoskeletal: No acute osseous abnormalities are identified. The visualized musculature is unremarkable in appearance. Review of the MIP images confirms the above findings. IMPRESSION: 1. No definite evidence of pulmonary embolus. 2. Markedly dilated right middle lobe bronchus, with associated peribronchovascular scarring. Central soft tissue density may reflect a fungal ball, better characterized than on the prior study. This may explain the patient's hemoptysis. 3. Hazy ground-glass airspace opacification at the lung apices, more prominent on the left, concerning for atypical infection. 4.  Prominent mediastinal nodes, measuring up to 1.3 cm in short axis. 5. Nonspecific 1.0 cm hypodensity at the hepatic dome is likely benign. Electronically Signed   By: Garald Balding M.D.   On: 12/09/2017 00:39     Medications:  Scheduled: . albuterol  2.5 mg Nebulization TID  . benzonatate  200 mg Oral BID  . ipratropium-albuterol  3 mL Nebulization Q6H  . pantoprazole  40 mg Oral Daily  . potassium chloride SA  20 mEq Oral TID   Continuous: . sodium chloride 75 mL/hr at 12/09/17 0535  . heparin    .  levofloxacin (LEVAQUIN) IV     VZC:HYIFOYDXAJOIN **OR** acetaminophen, albuterol, cyclobenzaprine, hydrALAZINE, ondansetron **OR** ondansetron (ZOFRAN) IV  Assessment/Plan:  Active Problems:   Acute myeloid leukemia in remission ([facility])   HTN (hypertension)   DVT (deep venous thrombosis) ([facility])   Acute respiratory failure with hypoxia (HCC)   Bronchiectasis (HCC)    Acute respiratory failure with hypoxia Possibly due to combination of atypical infection and worsening of bronchiectasis.  Patient also has fungal ball.  She was started on Levaquin.  Continue oxygen for now.  Wean down as tolerated.  Hemoptysis in the setting of right middle lobe mycetoma Patient is followed by pul[facility]ology at [facility].  Her hemoptysis seems to be very mild.  Pulmonology has seen the patient.  No indication for procedures as yet. Continue to treat with antibiotics for now.  She is not noted to be on any antifungal agents.  She will need to follow-up with the providers at [facility]  History of DVT involving left subclavian, brachiocephalic and lower jugular veins This was diagnosed in July 2018.  Patient is on Xarelto.  Xarelto held for now due to her hemoptysis.  She will be on IV heparin for now.  History of tobacco abuse Patient counseled to quit smoking.  Nicotine patch.  History of AML in remission Stable.  History of essential hypertension Monitor blood pressures closely.  Hypokalemia She is on potassium.  Check magnesium.   DVT Prophylaxis: IV heparin    Code Status: Full code Family Communication: Discussed with the patient Disposition Plan: Management as outlined above.  Wait for clinical improvement.    LOS: 0 days   [place] [facility] Pager [phone] 12/09/2017, 1:34 PM  If 7PM-7AM, please contact night-coverage at [email or web address], password [facility]

## 2017-12-09 NOTE — Progress Notes (Signed)
Pharmacy Note    A consult was received from an ED physician for cefepime per pharmacy dosing.  The patient's profile has been reviewed for ht/wt/allergies/indication/available labs.    A one time order has been placed for cefepime 2 gr IV.    Further antibiotics/pharmacy consults should be ordered by admitting physician if indicated.                       Thank you,  Royetta Asal, PharmD, BCPS Pager [phone] 12/09/2017 12:44 AM

## 2017-12-09 NOTE — ED Notes (Signed)
ED TO INPATIENT HANDOFF REPORT  Name/Age/Gender Jessica Vega 57 y.o. female  Code Status Code Status History    Date Active Date Inactive Code Status Order ID Comments User Context   04/03/2017 10:56 04/04/2017 19:28 Full Code [id number]  Reubin Milan, MD Inpatient   01/16/2017 16:51 01/17/2017 14:46 Full Code [id number]  [facility] ED      Home/SNF/Other Home  Chief Complaint Chest pain, SOB  Level of Care/Admitting Diagnosis ED Disposition    ED Disposition Condition [facility] Area: [place] [100102]  Level of Care: Telemetry [5]  Admit to tele based on following criteria: Monitor for Ischemic changes  Diagnosis: Acute respiratory failure with hypoxia [facility]) [546270]  Admitting Physician: Rise Patience [phone]  Attending Physician: Rise Patience [phone]  Estimated length of stay: past midnight tomorrow  Certification:: I certify this patient will need inpatient services for at least 2 midnights  PT Class (Do Not Modify): Inpatient [101]  PT Acc Code (Do Not Modify): Private [1]       Medical History Past Medical History:  Diagnosis Date  . AML (acute myeloblastic leukemia) ([place])    s/p bone marrow transplant in 8299 complicated by CMV, colitis, and sepsis  . Anemia   . Hypertension     Allergies Allergies  Allergen Reactions  . Chlorhexidine Rash  . Other Itching    Transparent dressing causes itching per pt  . Latex Rash    IV Location/Drains/Wounds Patient Lines/Drains/Airways Status   Active Line/Drains/Airways    Name:   Placement date:   Placement time:   Site:   Days:   Peripheral IV 12/08/17 Left Antecubital   12/08/17    2211    Antecubital   1   External Urinary Catheter   12/08/17    2157    -   1          Labs/Imaging Results for orders placed or performed during the hospital encounter of 12/08/17 (from the past 48 hour(s))  Basic metabolic panel     Status: Abnormal    Collection Time: 12/08/17 10:31 PM  Result Value Ref Range   Sodium 138 135 - 145 mmol/L   Potassium 3.6 3.5 - 5.1 mmol/L   Chloride 101 101 - 111 mmol/L   CO2 24 22 - 32 mmol/L   Glucose, Bld 104 (H) 65 - 99 mg/dL   BUN 15 6 - 20 mg/dL   Creatinine, Ser 1.12 (H) 0.44 - 1.00 mg/dL   Calcium 8.5 (L) 8.9 - 10.3 mg/dL   GFR calc non Af Amer 54 (L) >60 mL/min   GFR calc Af Amer >60 >60 mL/min    Comment: (NOTE) The eGFR has been calculated using the CKD EPI equation. This calculation has not been validated in all clinical situations. eGFR's persistently <60 mL/min signify possible Chronic Kidney Disease.    Anion gap 13 5 - 15    Comment: Performed at [facility], [place] [address] [place], [place] [address]  CBC     Status: Abnormal   Collection Time: 12/08/17 10:31 PM  Result Value Ref Range   WBC 15.9 (H) 4.0 - 10.5 K/uL   RBC 4.79 3.87 - 5.11 MIL/uL   Hemoglobin 15.6 (H) 12.0 - 15.0 g/dL   HCT 43.9 36.0 - 46.0 %   MCV 91.6 78.0 - 100.0 fL   MCH 32.6 26.0 - 34.0 pg   MCHC 35.5 30.0 -  36.0 g/dL   RDW 13.1 11.5 - 15.5 %   Platelets 291 150 - 400 K/uL    Comment: Performed at [facility], [place] [address] [place], [place] [address]  I-stat troponin, ED     Status: None   Collection Time: 12/08/17 10:38 PM  Result Value Ref Range   Troponin i, poc 0.00 0.00 - 0.08 ng/mL   Comment 3            Comment: Due to the release kinetics of cTnI, a negative result within the first hours of the onset of symptoms does not rule out myocardial infarction with certainty. If myocardial infarction is still suspected, repeat the test at appropriate intervals.   I-Stat beta hCG blood, ED     Status: None   Collection Time: 12/08/17 10:41 PM  Result Value Ref Range   I-stat hCG, quantitative <5.0 <5 mIU/mL   Comment 3            Comment:   GEST. AGE      CONC.  (mIU/mL)   <=1 WEEK        5 - 50     2 WEEKS       50 - 500     3 WEEKS       100 -  10,000     4 WEEKS     1,000 - 30,000        FEMALE AND NON-PREGNANT FEMALE:     LESS THAN 5 mIU/mL   I-Stat CG4 Lactic Acid, ED     Status: None   Collection Time: 12/08/17 10:42 PM  Result Value Ref Range   Lactic Acid, Venous 1.64 0.5 - 1.9 mmol/L   Dg Chest 2 View  Result Date: 12/08/2017 CLINICAL DATA:  Shortness of breath, sternal chest pain and fever for 2 days. History of smoking, bronchitis, pneumonia. EXAM: CHEST - 2 VIEW COMPARISON:  Chest CT January 16, 2017 FINDINGS: Cardiomediastinal silhouette is normal in size. Mildly elevated RIGHT hemidiaphragm with bibasilar strandy densities. No pleural effusion or focal consolidation. No pneumothorax. Irregular sclerosis bilateral humeral greater tuberosities. Surgical clips in the included right abdomen compatible with cholecystectomy. IMPRESSION: Bibasilar atelectasis/scarring. Electronically Signed   By: Elon Alas M.D.   On: 12/08/2017 22:27   Ct Angio Chest Pe W And/or Wo Contrast  Result Date: 12/09/2017 CLINICAL DATA:  Acute onset of shortness of breath and chest pain. Hemoptysis. Decreased O2 saturation and hypotension. Tachycardia. EXAM: CT ANGIOGRAPHY CHEST WITH CONTRAST TECHNIQUE: Multidetector CT imaging of the chest was performed using the standard protocol during bolus administration of intravenous contrast. Multiplanar CT image reconstructions and MIPs were obtained to evaluate the vascular anatomy. CONTRAST:  100 mL ISOVUE-370 IOPAMIDOL (ISOVUE-370) INJECTION 76% COMPARISON:  CTA of the chest performed 01/16/2017 FINDINGS: Cardiovascular:  There is no definite evidence of pulmonary embolus. The heart is normal in size. The thoracic aorta is grossly unremarkable. The great vessels are within normal limits. Mediastinum/Nodes: There is prominence of precarinal and aortopulmonary window nodes to 1.3 cm in short axis. No additional mediastinal lymphadenopathy is seen. No pericardial effusion is identified. The thyroid gland is  unremarkable. No axillary lymphadenopathy is seen. Lungs/Pleura: There appears to be a markedly dilated bronchus to the right middle lobe, with associated peribronchovascular scarring. A central soft tissue density may reflect a fungal ball. This is somewhat better characterized than on the prior study. Mild scarring is noted at the lung bases bilaterally. Hazy ground-glass airspace opacification is  seen at the lung apices, more prominent on the left. No pleural effusion or pneumothorax is seen. Upper Abdomen: A nonspecific 1.0 cm hypodensity is noted at the right hepatic dome. The visualized portions of the spleen, adrenal glands and kidneys are within normal limits. The patient is status post cholecystectomy, with clips noted at the gallbladder fossa. Musculoskeletal: No acute osseous abnormalities are identified. The visualized musculature is unremarkable in appearance. Review of the MIP images confirms the above findings. IMPRESSION: 1. No definite evidence of pulmonary embolus. 2. Markedly dilated right middle lobe bronchus, with associated peribronchovascular scarring. Central soft tissue density may reflect a fungal ball, better characterized than on the prior study. This may explain the patient's hemoptysis. 3. Hazy ground-glass airspace opacification at the lung apices, more prominent on the left, concerning for atypical infection. 4. Prominent mediastinal nodes, measuring up to 1.3 cm in short axis. 5. Nonspecific 1.0 cm hypodensity at the hepatic dome is likely benign. Electronically Signed   By: Garald Balding M.D.   On: 12/09/2017 00:39    Pending Labs Unresulted Labs (From admission, onward)   Start     Ordered   12/09/17 0249  Influenza panel by PCR (type A & B)  (Influenza PCR Panel)  Once,   R     12/09/17 0248   12/08/17 2156  Culture, blood (routine x 2)  BLOOD CULTURE X 2,   STAT     12/08/17 2155      Vitals/Pain Today's Vitals   12/08/17 2246 12/08/17 2330 12/09/17 0030 12/09/17  0100  BP:  134/83 120/86 116/79  Pulse:  (!) 120    Resp:  (!) 43 (!) 38 (!) 33  Temp:      TempSrc:      SpO2: 100% 98% 94% 100%  Weight:      Height:      PainSc:        Isolation Precautions Droplet precaution  Medications Medications  albuterol (PROVENTIL) (2.5 MG/3ML) 0.083% nebulizer solution 5 mg (5 mg Nebulization Given 12/08/17 2246)  sodium chloride 0.9 % bolus 1,000 mL (0 mLs Intravenous Stopped 12/09/17 0029)  ondansetron (ZOFRAN) injection 4 mg (4 mg Intravenous Given 12/08/17 2342)  sodium chloride 0.9 % injection (  Given by Other 12/09/17 0000)  iopamidol (ISOVUE-370) 76 % injection 100 mL (100 mLs Intravenous Contrast Given 12/08/17 2350)  azithromycin (ZITHROMAX) 500 mg in sodium chloride 0.9 % 250 mL IVPB (500 mg Intravenous New Bag/Given 12/09/17 0121)  ceFEPIme (MAXIPIME) 2 g in sodium chloride 0.9 % 100 mL IVPB (2 g Intravenous New Bag/Given 12/09/17 0251)    Mobility walks with person assist

## 2017-12-10 DIAGNOSIS — R042 Hemoptysis: Secondary | ICD-10-CM

## 2017-12-10 LAB — CBC
HEMATOCRIT: 28.9 % — AB (ref 36.0–46.0)
HEMATOCRIT: 41.4 % (ref 36.0–46.0)
HEMOGLOBIN: 14.1 g/dL (ref 12.0–15.0)
Hemoglobin: 9.5 g/dL — ABNORMAL LOW (ref 12.0–15.0)
MCH: 29.8 pg (ref 26.0–34.0)
MCH: 32.1 pg (ref 26.0–34.0)
MCHC: 32.9 g/dL (ref 30.0–36.0)
MCHC: 34.1 g/dL (ref 30.0–36.0)
MCV: 90.6 fL (ref 78.0–100.0)
MCV: 94.3 fL (ref 78.0–100.0)
PLATELETS: 241 10*3/uL (ref 150–400)
Platelets: 492 10*3/uL — ABNORMAL HIGH (ref 150–400)
RBC: 3.19 MIL/uL — ABNORMAL LOW (ref 3.87–5.11)
RBC: 4.39 MIL/uL (ref 3.87–5.11)
RDW: 18.3 % — AB (ref 11.5–15.5)
RDW: 13.6 % (ref 11.5–15.5)
WBC: 8.7 10*3/uL (ref 4.0–10.5)
WBC: 19.1 10*3/uL — ABNORMAL HIGH (ref 4.0–10.5)

## 2017-12-10 LAB — BASIC METABOLIC PANEL
Anion gap: 8 (ref 5–15)
BUN: 18 mg/dL (ref 6–20)
CALCIUM: 7.5 mg/dL — AB (ref 8.9–10.3)
CO2: 25 mmol/L (ref 22–32)
CREATININE: 0.44 mg/dL (ref 0.44–1.00)
Chloride: 107 mmol/L (ref 101–111)
GFR calc non Af Amer: >60 mL/min (ref >60)
Glucose, Bld: 101 mg/dL — ABNORMAL HIGH (ref 65–99)
Potassium: 3.9 mmol/L (ref 3.5–5.1)
Sodium: 140 mmol/L (ref 135–145)

## 2017-12-10 LAB — HEPARIN LEVEL (UNFRACTIONATED)
HEPARIN UNFRACTIONATED: 0.97 [IU]/mL — AB (ref 0.30–0.70)
Heparin Unfractionated: 1.4 IU/mL — ABNORMAL HIGH (ref 0.30–0.70)

## 2017-12-10 LAB — MAGNESIUM: Magnesium: 2.2 mg/dL (ref 1.7–2.4)

## 2017-12-10 LAB — APTT: aPTT: 185 seconds (ref 24–36)

## 2017-12-10 MED ORDER — RIVAROXABAN 20 MG PO TABS
20.0000 mg | ORAL_TABLET | Freq: Every day | ORAL | Status: DC
  Administered 2017-12-10: 20 mg via ORAL
  Filled 2017-12-10: qty 1

## 2017-12-10 MED ORDER — DIPHENHYDRAMINE HCL 25 MG PO CAPS
25.0000 mg | ORAL_CAPSULE | Freq: Every evening | ORAL | Status: DC | PRN
  Administered 2017-12-10: 25 mg via ORAL
  Filled 2017-12-10: qty 1

## 2017-12-10 MED ORDER — LEVOFLOXACIN 750 MG PO TABS
750.0000 mg | ORAL_TABLET | ORAL | Status: DC
  Administered 2017-12-10: 750 mg via ORAL
  Filled 2017-12-10: qty 1

## 2017-12-10 MED ORDER — IPRATROPIUM-ALBUTEROL 0.5-2.5 (3) MG/3ML IN SOLN
3.0000 mL | Freq: Three times a day (TID) | RESPIRATORY_TRACT | Status: DC
  Administered 2017-12-11: 3 mL via RESPIRATORY_TRACT
  Filled 2017-12-10 (×2): qty 3

## 2017-12-10 MED ORDER — HEPARIN (PORCINE) IN NACL 100-0.45 UNIT/ML-% IJ SOLN
1250.0000 [IU]/h | INTRAMUSCULAR | Status: DC
  Administered 2017-12-10: 1250 [IU]/h via INTRAVENOUS
  Filled 2017-12-10: qty 250

## 2017-12-10 MED ORDER — HEPARIN (PORCINE) IN NACL 100-0.45 UNIT/ML-% IJ SOLN
1100.0000 [IU]/h | INTRAMUSCULAR | Status: AC
  Filled 2017-12-10: qty 250

## 2017-12-10 MED ORDER — TRAZODONE HCL 50 MG PO TABS: 50.0000 mg | ORAL_TABLET | Freq: Every day | ORAL | Status: DC

## 2017-12-10 NOTE — Progress Notes (Addendum)
[facility] PROGRESS NOTE  Jessica Vega IRJ:[id number] DOB: 12/21/1960 DOA: 12/08/2017  PCP: Tennis Must, MD  Brief History/Interval Summary: 57 year old African-American female with a past medical history of AML in remission, bronchiectasis, history of right middle lobe fungal mycetoma and aspergillosis infection, history of DVT diagnosed in July 2018 currently on Xarelto, followed by pulmonology at [facility] presented with increasing shortness of breath for a few days prior to admission.  She also had blood-tinged sputum.  CT scan showed findings suggestive of fungal ball in the right middle bronchus.  There was also some concern for atypical infection in the left side.  Patient was hospitalized and placed on antibiotics.  Reason for Visit: Atypical pneumonia  Consultants: Pulmonology  Procedures: None  Antibiotics: Levaquin  Subjective/Interval History: Patient states that she feels much better this morning.  Not coughing up blood anymore.  Shortness of breath has improved.  She had a bowel movement this morning.  She has not ambulated yet.    ROS: Denies any nausea or vomiting  Objective:  Vital Signs  Vitals:   12/09/17 2200 12/10/17 0340 12/10/17 0458 12/10/17 0900  BP: 103/66  102/64   Pulse: (!) 117  (!) 105   Resp: (!) 22  (!) 22   Temp: 98.7 F (37.1 C)  98.2 F (36.8 C)   TempSrc: Oral  Oral   SpO2: 99% 91% 97% 93%  Weight:      Height:        Intake/Output Summary (Last 24 hours) at 12/10/2017 1144 Last data filed at 12/10/2017 0723 Gross per 24 hour  Intake 2285.42 ml  Output -  Net 2285.42 ml   Filed Weights   12/08/17 2137  Weight: 72.6 kg (160 lb)    General appearance: Awake alert.  In no distress. Resp: Coarse breath sounds bilaterally.  Few scattered wheezes.  Improved air entry compared to yesterday.  Crackles at the right base.  Normal effort at rest.   Cardio: S1-S2 is normal regular.  No S3-S4.  No rubs  murmurs of bruit GI: Abdomen is soft.  Nontender nondistended.  Bowel sounds are present.  No masses organomegaly Extremities: No edema Neurologic: No focal deficits  Lab Results:  Data Reviewed: I have personally reviewed following labs and imaging studies  CBC: Recent Labs  Lab 12/08/17 2231 12/09/17 0539 12/10/17 0605  WBC 15.9* 17.3* 8.7  HGB 15.6* 14.8 9.5*  HCT 43.9 43.4 28.9*  MCV 91.6 92.9 90.6  PLT 291 279 492*    Basic Metabolic Panel: Recent Labs  Lab 12/08/17 2231 12/09/17 0539 12/10/17 0605  NA 138 140 140  K 3.6 3.3* 3.9  CL 101 103 107  CO2 24 25 25   GLUCOSE 104* 111* 101*  BUN 15 13 18   CREATININE 1.12* 1.05* 0.44  CALCIUM 8.5* 8.1* 7.5*  MG  --   --  2.2    GFR: Estimated Creatinine Clearance: 75.4 mL/min (by C-G formula based on SCr of 0.44 mg/dL).  Cardiac Enzymes: Recent Labs  Lab 12/09/17 0705 12/09/17 1235 12/09/17 1855  TROPONINI <0.03 <0.03 <0.03    CBG: Recent Labs  Lab 12/09/17 0831  GLUCAP 143*     Recent Results (from the past 240 hour(s))  Culture, sputum-assessment     Status: None   Collection Time: 12/09/17  4:29 AM  Result Value Ref Range Status   Specimen Description EXPECTORATED SPUTUM  Final   Special Requests NONE  Final   Sputum evaluation   Final  THIS SPECIMEN IS ACCEPTABLE FOR SPUTUM CULTURE Performed at [facility], [place] [address], [place], [place] [address]    Report Status 12/09/2017 FINAL  Final  Culture, respiratory (NON-Expectorated)     Status: None (Preliminary result)   Collection Time: 12/09/17  4:29 AM  Result Value Ref Range Status   Specimen Description   Final    EXPECTORATED SPUTUM Performed at [place] [address] [place], [place] [address]    Special Requests   Final    NONE Reflexed from [id number] Performed at [facility], [place] [address] [place], [place] [address]    Gram Stain   Final    FEW WBC PRESENT,BOTH  PMN AND MONONUCLEAR RARE SQUAMOUS EPITHELIAL CELLS PRESENT FEW GRAM POSITIVE COCCI IN PAIRS FEW GRAM NEGATIVE COCCOBACILLI RARE GRAM POSITIVE RODS    Culture   Final    CULTURE REINCUBATED FOR BETTER GROWTH Performed at [facility], [place] [address], [place], [place] [address]    Report Status PENDING  Incomplete      Radiology Studies: Dg Chest 2 View  Result Date: 12/08/2017 CLINICAL DATA:  Shortness of breath, sternal chest pain and fever for 2 days. History of smoking, bronchitis, pneumonia. EXAM: CHEST - 2 VIEW COMPARISON:  Chest CT January 16, 2017 FINDINGS: Cardiomediastinal silhouette is normal in size. Mildly elevated RIGHT hemidiaphragm with bibasilar strandy densities. No pleural effusion or focal consolidation. No pneumothorax. Irregular sclerosis bilateral humeral greater tuberosities. Surgical clips in the included right abdomen compatible with cholecystectomy. IMPRESSION: Bibasilar atelectasis/scarring. Electronically Signed   By: Elon Alas M.D.   On: 12/08/2017 22:27   Ct Angio Chest Pe W And/or Wo Contrast  Result Date: 12/09/2017 CLINICAL DATA:  Acute onset of shortness of breath and chest pain. Hemoptysis. Decreased O2 saturation and hypotension. Tachycardia. EXAM: CT ANGIOGRAPHY CHEST WITH CONTRAST TECHNIQUE: Multidetector CT imaging of the chest was performed using the standard protocol during bolus administration of intravenous contrast. Multiplanar CT image reconstructions and MIPs were obtained to evaluate the vascular anatomy. CONTRAST:  100 mL ISOVUE-370 IOPAMIDOL (ISOVUE-370) INJECTION 76% COMPARISON:  CTA of the chest performed 01/16/2017 FINDINGS: Cardiovascular:  There is no definite evidence of pulmonary embolus. The heart is normal in size. The thoracic aorta is grossly unremarkable. The great vessels are within normal limits. Mediastinum/Nodes: There is prominence of precarinal and aortopulmonary window nodes to 1.3 cm in short axis. No additional  mediastinal lymphadenopathy is seen. No pericardial effusion is identified. The thyroid gland is unremarkable. No axillary lymphadenopathy is seen. Lungs/Pleura: There appears to be a markedly dilated bronchus to the right middle lobe, with associated peribronchovascular scarring. A central soft tissue density may reflect a fungal ball. This is somewhat better characterized than on the prior study. Mild scarring is noted at the lung bases bilaterally. Hazy ground-glass airspace opacification is seen at the lung apices, more prominent on the left. No pleural effusion or pneumothorax is seen. Upper Abdomen: A nonspecific 1.0 cm hypodensity is noted at the right hepatic dome. The visualized portions of the spleen, adrenal glands and kidneys are within normal limits. The patient is status post cholecystectomy, with clips noted at the gallbladder fossa. Musculoskeletal: No acute osseous abnormalities are identified. The visualized musculature is unremarkable in appearance. Review of the MIP images confirms the above findings. IMPRESSION: 1. No definite evidence of pulmonary embolus. 2. Markedly dilated right middle lobe bronchus, with associated peribronchovascular scarring. Central soft tissue density may reflect a fungal ball,  better characterized than on the prior study. This may explain the patient's hemoptysis. 3. Hazy ground-glass airspace opacification at the lung apices, more prominent on the left, concerning for atypical infection. 4. Prominent mediastinal nodes, measuring up to 1.3 cm in short axis. 5. Nonspecific 1.0 cm hypodensity at the hepatic dome is likely benign. Electronically Signed   By: Garald Balding M.D.   On: 12/09/2017 00:39     Medications:  Scheduled: . benzonatate  200 mg Oral BID  . ipratropium-albuterol  3 mL Nebulization Q6H  . levofloxacin  750 mg Oral Q24H  . nicotine  14 mg Transdermal Daily  . pantoprazole  40 mg Oral Daily  . potassium chloride SA  20 mEq Oral TID    Continuous: . heparin 1,250 Units/hr (12/10/17 0057)   YYT:[id number] **OR** acetaminophen, albuterol, alum & mag hydroxide-simeth, cyclobenzaprine, hydrALAZINE, ondansetron **OR** ondansetron (ZOFRAN) IV  Assessment/Plan:  Active Problems:   Acute myeloid leukemia in remission (HCC)   HTN (hypertension)   DVT (deep venous thrombosis) (HCC)   Acute respiratory failure with hypoxia (HCC)   Bronchiectasis (HCC)    Acute respiratory failure with hypoxia/atypical pneumonia Possibly due to combination of atypical infection and worsening of bronchiectasis.  Patient also has fungal ball.  She was started on Levaquin.  Wean down oxygen as tolerated.  Check sats with ambulation.  Check CBC tomorrow.  Hemoptysis in the setting of right middle lobe mycetoma Patient is followed by pulmonology at [facility].  Patient had very mild hemoptysis.  Appears to have resolved.  No indication for bronchoscopy at this time.  Continue with Levaquin for now.  She needs to follow-up with her pulmonologist at [facility].   Normocytic anemia Significant drop in hemoglobin noted.  Patient has not had any bleeding.  This could be a lab error.  We will repeat.  History of DVT involving left subclavian, brachiocephalic and lower jugular veins This was diagnosed in July 2018.  Patient is on Xarelto.  Xarelto held for now due to her hemoptysis.  She is currently on IV heparin.  If she remains stable then tomorrow she can be transitioned back to her Xarelto.   History of tobacco abuse Patient counseled to quit smoking.  Nicotine patch.  History of AML in remission Stable.  History of essential hypertension Blood pressure is reasonably well controlled.  Hypokalemia Potassium level has improved.  Magnesium is normal.   DVT Prophylaxis: IV heparin    Code Status: Full code Family Communication: Discussed with the patient Disposition Plan: Mobilize.  Check pulse ox with ambulation.  Change to oral  antibiotics.  Anticipate discharge tomorrow.    LOS: 1 day   [facility] Pager [phone] 12/10/2017, 11:44 AM  If 7PM-7AM, please contact night-coverage at [email or web address], password [facility]

## 2017-12-10 NOTE — Plan of Care (Signed)
  Clinical Measurements: Respiratory complications will improve 12/10/2017 2110 - Progressing by Mickie Kay, RN

## 2017-12-10 NOTE — Progress Notes (Signed)
Advised by pharmacy to change Heparin to 12.5. Dose changed. Pt tolerating well, no s/s of bleeding. Will c/t monitor

## 2017-12-10 NOTE — Progress Notes (Addendum)
ANTICOAGULATION CONSULT NOTE - Follow Up Consult  Pharmacy Consult for heparin Indication: hx DVT  Allergies  Allergen Reactions  . Chlorhexidine Rash  . Other Itching    Transparent dressing causes itching per pt  . Latex Rash    Patient Measurements: Height: 5' 2.75" (159.4 cm) Weight: 160 lb (72.6 kg) IBW/kg (Calculated) : 51.83 Heparin Dosing Weight: 67 kg  Vital Signs: Temp: 98.2 F (36.8 C) (03/10 0458) Temp Source: Oral (03/10 0458) BP: 102/64 (03/10 0458) Pulse Rate: 105 (03/10 0458)  Labs: Recent Labs    12/08/17 2231 12/09/17 0539 12/09/17 0705 12/09/17 1235 12/09/17 1855 12/09/17 2241 12/10/17 0012 12/10/17 0605 12/10/17 1006  HGB 15.6* 14.8  --   --   --   --   --  9.5*  --   HCT 43.9 43.4  --   --   --   --   --  28.9*  --   PLT 291 279  --   --   --   --   --  492*  --   APTT  --   --   --   --   --  57*  --   --  185*  HEPARINUNFRC  --   --   --   --   --   --  0.97*  --  1.40*  CREATININE 1.12* 1.05*  --   --   --   --   --  0.44  --   TROPONINI  --   --  <0.03 <0.03 <0.03  --   --   --   --     Estimated Creatinine Clearance: 75.4 mL/min (by C-G formula based on SCr of 0.44 mg/dL).   Medications:  Xarelto 20 mg daily PTA (last dose taken on 3/8 at 5p)  Assessment: Patient is a 57 y.o F with hx right middle lobe mycetoma and hx upper neck DVT on xarelto 20 mg daily PTA, presented to the ED on 12/09/17 with c/o SOB and hemoptysis.  Anticoagulation was transitioned to heparin drip on admission.  Today, 12/10/2017: - aPTT elevated at 185 - Hgb down, Plts elevated - no bleeding reported per RN - pulmonary team recom no bronchoscopy at this time  Goal of Therapy:  Heparin level 0.3-0.7 units/ml aPTT 66-102 seconds Monitor platelets by anticoagulation protocol: Yes   Plan:  - hold heparin drip x 1 hour then - decrease heparin drip to 1100 units/hr. - check 6 hr aPTT and heparin level - will adjust heparin drip based on aPTT level until  aPTT and heparin levels correlate with each other, then switch to heparin level monitoring - monitor for s/s bleeding  Dolly Rias RPh 12/10/2017, 11:52 AM Pager [phone]  Pharmacy consulted to resume Xarelto  Plan: -Stop heparin at 1700 and resume xarelto 20mg  po once daily (discussed with RN) -Stop heparin labs -Monitor for s/s bleeding  Dolly Rias RPh 12/10/2017, 1:39 PM Pager [phone]

## 2017-12-11 LAB — BLOOD CULTURE ID PANEL (REFLEXED)
Acinetobacter baumannii: NOT DETECTED
CANDIDA GLABRATA: NOT DETECTED
CANDIDA KRUSEI: NOT DETECTED
CANDIDA PARAPSILOSIS: NOT DETECTED
Candida albicans: NOT DETECTED
Candida tropicalis: NOT DETECTED
ENTEROBACTER CLOACAE COMPLEX: NOT DETECTED
ENTEROCOCCUS SPECIES: NOT DETECTED
ESCHERICHIA COLI: NOT DETECTED
Enterobacteriaceae species: NOT DETECTED
Haemophilus influenzae: NOT DETECTED
KLEBSIELLA OXYTOCA: NOT DETECTED
Klebsiella pneumoniae: NOT DETECTED
LISTERIA MONOCYTOGENES: NOT DETECTED
Neisseria meningitidis: NOT DETECTED
PROTEUS SPECIES: NOT DETECTED
Pseudomonas aeruginosa: NOT DETECTED
SERRATIA MARCESCENS: NOT DETECTED
STAPHYLOCOCCUS AUREUS BCID: NOT DETECTED
STREPTOCOCCUS PNEUMONIAE: NOT DETECTED
STREPTOCOCCUS PYOGENES: NOT DETECTED
Staphylococcus species: NOT DETECTED
Streptococcus agalactiae: NOT DETECTED
Streptococcus species: NOT DETECTED

## 2017-12-11 LAB — CBC
HCT: 40.9 % (ref 36.0–46.0)
HEMOGLOBIN: 14.2 g/dL (ref 12.0–15.0)
MCH: 32.3 pg (ref 26.0–34.0)
MCHC: 34.7 g/dL (ref 30.0–36.0)
MCV: 93 fL (ref 78.0–100.0)
Platelets: 238 10*3/uL (ref 150–400)
RBC: 4.4 MIL/uL (ref 3.87–5.11)
RDW: 13.4 % (ref 11.5–15.5)
WBC: 17.6 10*3/uL — AB (ref 4.0–10.5)

## 2017-12-11 LAB — BASIC METABOLIC PANEL
ANION GAP: 11 (ref 5–15)
BUN: 11 mg/dL (ref 6–20)
CHLORIDE: 103 mmol/L (ref 101–111)
CO2: 25 mmol/L (ref 22–32)
Calcium: 8.6 mg/dL — ABNORMAL LOW (ref 8.9–10.3)
Creatinine, Ser: 0.97 mg/dL (ref 0.44–1.00)
GFR calc non Af Amer: >60 mL/min (ref >60)
Glucose, Bld: 94 mg/dL (ref 65–99)
Potassium: 4.1 mmol/L (ref 3.5–5.1)
Sodium: 139 mmol/L (ref 135–145)

## 2017-12-11 LAB — CULTURE, RESPIRATORY W GRAM STAIN: Culture: NORMAL

## 2017-12-11 LAB — CULTURE, RESPIRATORY

## 2017-12-11 MED ORDER — LEVOFLOXACIN 750 MG PO TABS: 750.0000 mg | ORAL_TABLET | ORAL | 0 refills | Status: AC

## 2017-12-11 MED ORDER — NICOTINE 14 MG/24HR TD PT24: 14.0000 mg | MEDICATED_PATCH | Freq: Every day | TRANSDERMAL | 0 refills | Status: DC

## 2017-12-11 MED ORDER — LIP MEDEX EX OINT
TOPICAL_OINTMENT | CUTANEOUS | Status: AC
  Administered 2017-12-11: 07:00:00
  Filled 2017-12-11: qty 7

## 2017-12-11 MED ORDER — BENZONATATE 200 MG PO CAPS: 200.0000 mg | ORAL_CAPSULE | Freq: Three times a day (TID) | ORAL | 0 refills | Status: DC | PRN

## 2017-12-11 NOTE — Progress Notes (Signed)
PHARMACY - PHYSICIAN COMMUNICATION CRITICAL VALUE ALERT - BLOOD CULTURE IDENTIFICATION (BCID)  Jessica Vega is an 57 y.o. female who presented to [facility] on 12/08/2017 with a chief complaint of shortness of breath  Assessment:  1 of 2 blood cx's with gram positive rods - likely contaminant  Name of physician (or Provider) Contacted: [place] Pink  Current antibiotics: Levaquin for PNA  Changes to prescribed antibiotics recommended: no changes   Results for orders placed or performed during the hospital encounter of 12/08/17  Blood Culture ID Panel (Reflexed) (Collected: 12/08/2017 10:12 PM)  Result Value Ref Range   Enterococcus species NOT DETECTED NOT DETECTED   Listeria monocytogenes NOT DETECTED NOT DETECTED   Staphylococcus species NOT DETECTED NOT DETECTED   Staphylococcus aureus NOT DETECTED NOT DETECTED   Streptococcus species NOT DETECTED NOT DETECTED   Streptococcus agalactiae NOT DETECTED NOT DETECTED   Streptococcus pneumoniae NOT DETECTED NOT DETECTED   Streptococcus pyogenes NOT DETECTED NOT DETECTED   Acinetobacter baumannii NOT DETECTED NOT DETECTED   Enterobacteriaceae species NOT DETECTED NOT DETECTED   Enterobacter cloacae complex NOT DETECTED NOT DETECTED   Escherichia coli NOT DETECTED NOT DETECTED   Klebsiella oxytoca NOT DETECTED NOT DETECTED   Klebsiella pneumoniae NOT DETECTED NOT DETECTED   Proteus species NOT DETECTED NOT DETECTED   Serratia marcescens NOT DETECTED NOT DETECTED   Haemophilus influenzae NOT DETECTED NOT DETECTED   Neisseria meningitidis NOT DETECTED NOT DETECTED   Pseudomonas aeruginosa NOT DETECTED NOT DETECTED   Candida albicans NOT DETECTED NOT DETECTED   Candida glabrata NOT DETECTED NOT DETECTED   Candida krusei NOT DETECTED NOT DETECTED   Candida parapsilosis NOT DETECTED NOT DETECTED   Candida tropicalis NOT DETECTED NOT DETECTED    Kara Mead 12/11/2017  11:29 AM

## 2017-12-11 NOTE — Discharge Instructions (Signed)
Hemoptysis °Hemoptysis is when you cough up blood. It can be mild or serious. If it is mild, you may cough up bloody spit and mucus (sputum). If you cough up 1-2 cups (240-480 mL) of blood within 24 hours (massive hemoptysis), it is an emergency. °If you cough up blood, it is important to go and see your doctor. °Follow these instructions at home: °· Watch your condition for any changes. °· Take over-the-counter and prescription medicines only as told by your doctor. °· If you were prescribed an antibiotic medicine, take it as told by your doctor. Do not stop taking the antibiotic even if you start to feel better. °· Go back to your normal activities as told by your doctor. Ask your doctor what activities are safe for you to do. °· Do not use any products that contain nicotine or tobacco. These include cigarettes and e-cigarettes. If you need help quitting, ask your doctor. °· Keep all follow-up visits as told by your doctor. This is important. °Contact a doctor if: °· You have a fever. °· You cough up bloody spit and mucus. °Get help right away if: °· You cough up fresh blood or blood clots. °· You have trouble breathing. °· You have chest pain. °This information is not intended to replace advice given to you by your health care provider. Make sure you discuss any questions you have with your health care provider. °Document Released: 09/05/2012 Document Revised: 06/17/2016 Document Reviewed: 06/17/2016 °Elsevier Interactive Patient Education © 2018 Elsevier Inc[employer] ° °

## 2017-12-11 NOTE — Discharge Summary (Signed)
[facility]  Physician Discharge Summary   Patient ID: Jessica Vega MRN: [id number] DOB/AGE: November 16, 1960 57 y.o.  Admit date: 12/08/2017 Discharge date: 12/11/2017  PCP: Tennis Must, MD  DISCHARGE DIAGNOSES:  Active Problems:   Acute myeloid leukemia in remission ([place])   HTN (hypertension)   DVT (deep venous thrombosis) (HCC)   Acute respiratory failure with hypoxia (HCC)   Bronchiectasis (HCC)   RECOMMENDATIONS FOR OUTPATIENT FOLLOW UP: 1. Patient instructed to call her pulmonologist at [facility] to schedule follow-up appointment. 2. She will need home oxygen which has been ordered.   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  She also had blood-tinged sputum.  CT scan showed findings suggestive of fungal ball in the right middle bronchus.  There was also some concern for atypical infection in the left side.  Patient was hospitalized and placed on antibiotics.  Consultations:  Pulmonology  Procedures:  None    HOSPITAL COURSE:    Acute respiratory failure with hypoxia/atypical pneumonia Possibly due to combination of atypical infection and worsening of bronchiectasis.  Patient also has fungal ball.  She was started on Levaquin.    She has improved although continues to require oxygen.  Home oxygen will be ordered.  Patient to follow-up with her pulmonologist at [facility].  CBC remains elevated although she is afebrile.  Sputum is not as purulent as  before.  Hemoptysis in the setting of right middle lobe mycetoma Patient is followed by pulmonology at [facility].  Patient had very mild hemoptysis.  Much improved.  She was seen by our pulmonologist.  Did not require any procedures.  No drop in hemoglobin.  Started back on Xarelto. Continue with Levaquin for now.  She needs to follow-up with her pulmonologist at [facility].   Bacteremia 1 out of 2 sets of blood culture positive for gram-positive rods.  This is most likely a contaminant.  She is afebrile.  Patient informed of this finding.  She was told to seek attention if she develops high fever and chills or feels unwell.  Normocytic anemia Hemoglobin of 9.5 on 3/10 was a lab error.  Actual hemoglobin is 14 and has been stable.    History of DVT involving left subclavian, brachiocephalic and lower jugular veins This was diagnosed in July 2018.  Patient is on Xarelto.  Xarelto was held due to hemoptysis.  Once her symptoms improved this was resumed.  Stable.    History of tobacco abuse Patient counseled to quit smoking.  Nicotine patch.  History of AML in remission Stable.  History of essential hypertension Blood pressure is reasonably well controlled.  Hypokalemia Potassium level has improved.  Magnesium is normal.  Overall stable.  Okay for discharge home today.     PERTINENT LABS:  The results of significant diagnostics from this hospitalization (including imaging, microbiology, ancillary and laboratory) are listed below for reference.    Microbiology: Recent Results (from the past 240 hour(s))  Culture, blood (routine x 2)     Status: None (Preliminary result)   Collection Time: 12/08/17 10:12 PM  Result Value Ref Range Status   Specimen Description   Final    BLOOD LEFT ANTECUBITAL Performed at [facility] [address] [place], [place] [address]    Special Requests   Final    BOTTLES DRAWN AEROBIC AND ANAEROBIC Blood Culture  adequate volume Performed at [facility] [address], [place], [place] [address]    Culture  Setup Time   Final    GRAM POSITIVE RODS AEROBIC BOTTLE ONLY CRITICAL RESULT CALLED TO, READ BACK BY AND VERIFIED WITH: M. BELL, RPHARMD ([facility]) AT  1100 ON 12/11/17 BY C. JESSUP, MLT. Performed at [facility], [place] [address], [place], [place] [address]    Culture GRAM POSITIVE RODS  Final   Report Status PENDING  Incomplete  Blood Culture ID Panel (Reflexed)     Status: None   Collection Time: 12/08/17 10:12 PM  Result Value Ref Range Status   Enterococcus species NOT DETECTED NOT DETECTED Final   Listeria monocytogenes NOT DETECTED NOT DETECTED Final   Staphylococcus species NOT DETECTED NOT DETECTED Final   Staphylococcus aureus NOT DETECTED NOT DETECTED Final   Streptococcus species NOT DETECTED NOT DETECTED Final   Streptococcus agalactiae NOT DETECTED NOT DETECTED Final   Streptococcus pneumoniae NOT DETECTED NOT DETECTED Final   Streptococcus pyogenes NOT DETECTED NOT DETECTED Final   Acinetobacter baumannii NOT DETECTED NOT DETECTED Final   Enterobacteriaceae species NOT DETECTED NOT DETECTED Final   Enterobacter cloacae complex NOT DETECTED NOT DETECTED Final   Escherichia coli NOT DETECTED NOT DETECTED Final   Klebsiella oxytoca NOT DETECTED NOT DETECTED Final   Klebsiella pneumoniae NOT DETECTED NOT DETECTED Final   Proteus species NOT DETECTED NOT DETECTED Final   Serratia marcescens NOT DETECTED NOT DETECTED Final   Haemophilus influenzae NOT DETECTED NOT DETECTED Final   Neisseria meningitidis NOT DETECTED NOT DETECTED Final   Pseudomonas aeruginosa NOT DETECTED NOT DETECTED Final   Candida albicans NOT DETECTED NOT DETECTED Final   Candida glabrata NOT DETECTED NOT DETECTED Final   Candida krusei NOT DETECTED NOT DETECTED Final   Candida parapsilosis NOT DETECTED NOT DETECTED Final   Candida tropicalis NOT DETECTED NOT DETECTED Final    Comment:  Performed at [facility], [place]. [address] [place], [place] [address]  Culture, blood (routine x 2)     Status: None (Preliminary result)   Collection Time: 12/08/17 10:31 PM  Result Value Ref Range Status   Specimen Description   Final    BLOOD LEFT HAND Performed at [facility] [address], [place], [place] [address]    Special Requests   Final    BOTTLES DRAWN AEROBIC AND ANAEROBIC Blood Culture adequate volume Performed at [facility] [address] [place], [place] [address]    Culture   Final    NO GROWTH 2 DAYS Performed at [facility] [address] [place], [place] [address]    Report Status PENDING  Incomplete  Culture, sputum-assessment     Status: None   Collection Time: 12/09/17  4:29 AM  Result Value Ref Range Status   Specimen Description EXPECTORATED SPUTUM  Final   Special Requests NONE  Final   Sputum evaluation   Final    THIS SPECIMEN IS ACCEPTABLE FOR SPUTUM CULTURE Performed at [facility], [place] [address], [place], [place] [address]    Report Status 12/09/2017 FINAL  Final  Culture, respiratory (NON-Expectorated)  Status: None   Collection Time: 12/09/17  4:29 AM  Result Value Ref Range Status   Specimen Description   Final    EXPECTORATED SPUTUM Performed at [facility], [place] [address] [place], [place] [address]    Special Requests   Final    NONE Reflexed from [id number] [phone] Performed at [facility], [place] [address] [place], [place] [address]    Gram Stain   Final    FEW WBC PRESENT,BOTH PMN AND MONONUCLEAR RARE SQUAMOUS EPITHELIAL CELLS PRESENT FEW GRAM POSITIVE COCCI IN PAIRS FEW GRAM NEGATIVE COCCOBACILLI RARE GRAM POSITIVE RODS    Culture   Final    Consistent with normal respiratory flora. Performed at [facility], [place] [address], [place], [place] [address]    Report Status 12/11/2017 FINAL  Final     Labs: Basic  Metabolic Panel: Recent Labs  Lab 12/08/17 2231 12/09/17 0539 12/10/17 0605 12/11/17 0542  NA 138 140 140 139  K 3.6 3.3* 3.9 4.1  CL 101 103 107 103  CO2 24 25 25 25   GLUCOSE 104* 111* 101* 94  BUN 15 13 18 11   CREATININE 1.12* 1.05* 0.44 0.97  CALCIUM 8.5* 8.1* 7.5* 8.6*  MG  --   --  2.2  --    CBC: Recent Labs  Lab 12/08/17 2231 12/09/17 0539 12/10/17 0605 12/10/17 1000 12/11/17 0542  WBC 15.9* 17.3* 8.7 19.1* 17.6*  HGB 15.6* 14.8 9.5* 14.1 14.2  HCT 43.9 43.4 28.9* 41.4 40.9  MCV 91.6 92.9 90.6 94.3 93.0  PLT 291 279 492* 241 238   Cardiac Enzymes: Recent Labs  Lab 12/09/17 0705 12/09/17 1235 12/09/17 1855  TROPONINI <0.03 <0.03 <0.03   CBG: Recent Labs  Lab 12/09/17 0831  GLUCAP 143*     IMAGING STUDIES Dg Chest 2 View  Result Date: 12/08/2017 CLINICAL DATA:  Shortness of breath, sternal chest pain and fever for 2 days. History of smoking, bronchitis, pneumonia. EXAM: CHEST - 2 VIEW COMPARISON:  Chest CT January 16, 2017 FINDINGS: Cardiomediastinal silhouette is normal in size. Mildly elevated RIGHT hemidiaphragm with bibasilar strandy densities. No pleural effusion or focal consolidation. No pneumothorax. Irregular sclerosis bilateral humeral greater tuberosities. Surgical clips in the included right abdomen compatible with cholecystectomy. IMPRESSION: Bibasilar atelectasis/scarring. Electronically Signed   By: Elon Alas M.D.   On: 12/08/2017 22:27   Ct Angio Chest Pe W And/or Wo Contrast  Result Date: 12/09/2017 CLINICAL DATA:  Acute onset of shortness of breath and chest pain. Hemoptysis. Decreased O2 saturation and hypotension. Tachycardia. EXAM: CT ANGIOGRAPHY CHEST WITH CONTRAST TECHNIQUE: Multidetector CT imaging of the chest was performed using the standard protocol during bolus administration of intravenous contrast. Multiplanar CT image reconstructions and MIPs were obtained to evaluate the vascular anatomy. CONTRAST:  100 mL ISOVUE-370  IOPAMIDOL (ISOVUE-370) INJECTION 76% COMPARISON:  CTA of the chest performed 01/16/2017 FINDINGS: Cardiovascular:  There is no definite evidence of pulmonary embolus. The heart is normal in size. The thoracic aorta is grossly unremarkable. The great vessels are within normal limits. Mediastinum/Nodes: There is prominence of precarinal and aortopulmonary window nodes to 1.3 cm in short axis. No additional mediastinal lymphadenopathy is seen. No pericardial effusion is identified. The thyroid gland is unremarkable. No axillary lymphadenopathy is seen. Lungs/Pleura: There appears to be a markedly dilated bronchus to the right middle lobe, with associated peribronchovascular scarring. A central soft tissue density may reflect a fungal ball. This is somewhat better characterized than on the prior  study. Mild scarring is noted at the lung bases bilaterally. Hazy ground-glass airspace opacification is seen at the lung apices, more prominent on the left. No pleural effusion or pneumothorax is seen. Upper Abdomen: A nonspecific 1.0 cm hypodensity is noted at the right hepatic dome. The visualized portions of the spleen, adrenal glands and kidneys are within normal limits. The patient is status post cholecystectomy, with clips noted at the gallbladder fossa. Musculoskeletal: No acute osseous abnormalities are identified. The visualized musculature is unremarkable in appearance. Review of the MIP images confirms the above findings. IMPRESSION: 1. No definite evidence of pulmonary embolus. 2. Markedly dilated right middle lobe bronchus, with associated peribronchovascular scarring. Central soft tissue density may reflect a fungal ball, better characterized than on the prior study. This may explain the patient's hemoptysis. 3. Hazy ground-glass airspace opacification at the lung apices, more prominent on the left, concerning for atypical infection. 4. Prominent mediastinal nodes, measuring up to 1.3 cm in short axis. 5.  Nonspecific 1.0 cm hypodensity at the hepatic dome is likely benign. Electronically Signed   By: Garald Balding M.D.   On: 12/09/2017 00:39    DISCHARGE EXAMINATION: Vitals:   12/10/17 1500 12/10/17 2130 12/11/17 0505 12/11/17 0931  BP: 112/70 107/68 99/61   Pulse: (!) 109 (!) 105 (!) 101   Resp: 20 20 20    Temp: 98.6 F (37 C) 98.4 F (36.9 C) 98 F (36.7 C)   TempSrc: Oral Oral Oral   SpO2: 94% 95% 96% 97%  Weight:      Height:       General appearance: alert, cooperative, appears stated age and no distress Resp: Coarse breath sounds bilaterally with few scattered crackles.  No wheezing or rhonchi.  Normal effort at rest. Cardio: regular rate and rhythm, S1, S2 normal, no murmur, click, rub or gallop GI: soft, non-tender; bowel sounds normal; no masses,  no organomegaly  DISPOSITION: Home  Discharge Instructions    Call MD for:  difficulty breathing, headache or visual disturbances   Complete by:  As directed    Call MD for:  extreme fatigue   Complete by:  As directed    Call MD for:  persistant dizziness or light-headedness   Complete by:  As directed    Call MD for:  persistant nausea and vomiting   Complete by:  As directed    Call MD for:  severe uncontrolled pain   Complete by:  As directed    Call MD for:  temperature >100.4   Complete by:  As directed    Diet - low sodium heart healthy   Complete by:  As directed    Discharge instructions   Complete by:  As directed    Please seek attention if you develop fever/chills. Take your medications as prescribed. Call your pulmonologist at [facility] to schedule appt.  You were cared for by a hospitalist during your hospital stay. If you have any questions about your discharge medications or the care you received while you were in the hospital after you are discharged, you can call the unit and asked to speak with the hospitalist on call if the hospitalist that took care of you is not available. Once you are discharged,  your primary care physician will handle any further medical issues. Please note that NO REFILLS for any discharge medications will be authorized once you are discharged, as it is imperative that you return to your primary care physician (or establish a relationship with a primary care  physician if you do not have one) for your aftercare needs so that they can reassess your need for medications and monitor your lab values. If you do not have a primary care physician, you can call [phone] for a physician referral.   Increase activity slowly   Complete by:  As directed         Allergies as of 12/11/2017      Reactions   Chlorhexidine Rash   Other Itching   Transparent dressing causes itching per pt   Latex Rash      Medication List    STOP taking these medications   amLODipine 5 MG tablet Commonly known as:  NORVASC   famotidine 20 MG tablet Commonly known as:  PEPCID     TAKE these medications   Albuterol Sulfate 108 (90 Base) MCG/ACT Aepb Commonly known as:  PROAIR RESPICLICK Inhale 2 puffs into the lungs every 4 (four) hours as needed. What changed:  reasons to take this   benzonatate 200 MG capsule Commonly known as:  TESSALON Take 1 capsule (200 mg total) by mouth 3 (three) times daily as needed for cough.   cholecalciferol 1000 units tablet Commonly known as:  VITAMIN D Take 1,000 Units by mouth daily after breakfast.   cyclobenzaprine 10 MG tablet Commonly known as:  FLEXERIL Take 1 tablet (10 mg total) by mouth daily as needed for muscle spasms.   levofloxacin 750 MG tablet Commonly known as:  LEVAQUIN Take 1 tablet (750 mg total) by mouth daily for 6 days.   nicotine 14 mg/24hr patch Commonly known as:  NICODERM CQ - dosed in mg/24 hours Place 1 patch (14 mg total) onto the skin daily. Start taking on:  12/12/2017   omeprazole 20 MG capsule Commonly known as:  PRILOSEC Take 20 mg by mouth at bedtime.   potassium chloride SA 20 MEQ tablet Commonly known  as:  K-DUR,KLOR-CON Take 20 mEq by mouth 3 (three) times daily.   Rivaroxaban 15 & 20 MG Tbpk Take as directed on package: Start with one 15mg  tablet by mouth twice a day with food. On Day 22, switch to one 20mg  tablet once a day with food. What changed:    how much to take  how to take this  when to take this  additional instructions            Durable Medical Equipment  (From admission, onward)        Start     Ordered   12/11/17 0945  For home use only DME oxygen  Once    Question Answer Comment  Mode or (Route) Nasal cannula   Liters per Minute 2   Frequency Continuous (stationary and portable oxygen unit needed)   Oxygen conserving device Yes   Oxygen delivery system Gas      12/11/17 0944       Follow-up Information    Dorann Ou, MD. Schedule an appointment as soon as possible for a visit in 1 week(s).   Specialty:  Pulmonary Disease Why:  call to schdule appt Contact information: [place] [address] [phone]           TOTAL DISCHARGE TIME: 35 minutes  Bonnielee Haff  [facility] Pager [phone]  12/11/2017, 1:47 PM

## 2017-12-11 NOTE — Progress Notes (Signed)
Patient has discharged to home on 12/11/17. Discharge instruction including medication and appointments was given to patient. Patient has no question at this time.

## 2017-12-11 NOTE — Progress Notes (Signed)
On room air, at rest : pt's spO2 = 84%  On room air, while ambulating; spO2 = 86%  On oxygen = 1L while ambulating, spO2 = 88% On oxygen = 2L; while ambulating; spO2 = 89%; On oxygen = 3L, while ambualting; spO2 = 90- 92%

## 2017-12-12 ENCOUNTER — Telehealth: Payer: Self-pay | Admitting: Infectious Disease

## 2017-12-12 LAB — CULTURE, BLOOD (ROUTINE X 2): SPECIAL REQUESTS: ADEQUATE

## 2017-12-12 NOTE — Telephone Encounter (Signed)
Excellent

## 2017-12-12 NOTE — Telephone Encounter (Signed)
Patient recently admitted to [facility] w history of fungus ball myceteoma coughing up blood. She had blood cultures dranw and POST DC 1/2 is growing an actinomyces species.  I wanted to brin her in to be seen by [facility] provider to get repeat blood cultures investigate to see if she might have dental infection and change her abx  I have some availability this afternoon and it looks like Jessica Kras do as well.  I DONT want to put her on my schedule tomorrow since I am not feeling well and there is possibiliyt I may cancel clinic tomorrow.

## 2017-12-12 NOTE — Telephone Encounter (Signed)
Per Dr Tommy Medal called the patient to offer her an office visit. She advised she can not come tomorrow 12/13/17 as she has the Oxygen people coming and has to wait for delivery. She can however come 12/14/17 and see Colletta [place] as she has some availability. Gave her an appt for 12/14/17 at 10 am, the address and our phone number just in case.

## 2017-12-13 NOTE — Assessment & Plan Note (Deleted)
Sees [facility] pulmonology team for management. [id number] was negative for aspergillosis galactomannan. She has had several gram stains with recovered hyphae and multiple other bacterial co-infections. She has had documented fungal infections and was on prophylaxis with diflucan until 2016 sometime.   Differential may need to include actinomyces pulmonary infection - this can resemble soft-tissue mass on CT. With recent hemoptysis

## 2017-12-13 NOTE — Progress Notes (Addendum)
Patient: Jessica Vega  DOB: [birth year] MRN: [id number] PCP: Tennis Must, MD  Referring Provider: Hospital Follow Up   Chief Complaint  Patient presents with  . Follow-up    feeling sick and short of breath     Patient Active Problem List   Diagnosis Date Noted  . 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 Bronchiectasis ([place]) 12/09/2017  . DVT (deep venous thrombosis) ([place]) 04/04/2017  . Acute deep vein thrombosis (DVT) of brachial vein of left upper extremity ([place]) 04/03/2017  . Tobacco use disorder 04/03/2017  . Endobronchial mass 04/03/2017  . Chest pain 01/16/2017  . Dyspnea 01/16/2017  . Acute myeloid leukemia in remission ([place]) 01/16/2017  . History of pericarditis 01/16/2017  . HTN (hypertension) 01/16/2017  . Atypical chest pain   . Atrial flutter ([place])   . Other pancytopenia ([place]) 04/30/2013     Subjective:  HPI:  Zylpha is a 57 y.o. AA female with past medical history significant for acute myeloid leukemia in remission s/p bone marrow transplant, bronchiectasis, fungal mycetoma of the RML (tx with [facility] pulmonology team), DVT of left upper extremity and jugular vein on chronic anticoagulation. Has been working with [facility] since 07/2017 for tx of newly diagnosed bronchiectasis, worsening dyspnea on exertion/at rest and persistent pulmonary intraluminal mass. She has had numerous bronchoscopies over the years per chart review yielding pseudomonas, moraxella catarrhalis, candida glabrata (07/2014 - fungemic during presentation and tx with 4x IV antifungals then secondary prophylaxis), penicillium, saprophytic fungus. Most recently bronchoscopy was performed in November 2018 - cultures were positive for pseudomonas only and she was treated with course of Levaquin.    Recent Hospitalization:  She was recently hospitalized 3/09 - 12/11/17 at [facility] for worsened shortness of breath and hemoptysis. CT scan was obtained revealing  persistent soft tissue appearing endobronchial mass suggestive of fungal ball as well as concern for infection of the left upper lobe. She was given empiric antibiotics for atypical pneumonia with improvement and sent out on Levaquin and home oxygen therapy, which is new for her.   Today she is feeling nearly back to pre-hospital normal, which she described to be "nearly 100%". Ms. Steinberg went on to describe to me that since September/October she has had significant changes to her activity tolerance and dyspnea. It takes her 3-4 hours to fully get going in the morning and has to take a lot of breaks and has significantly altered her daily routine due to this. With this recent hospitalization she was told she needed oxygen at home - she uses this from time to time but tells me that the machine "sets off the electricity" and "shorts things out" when used with other things. She has not called to make a follow up appointment with her pulmonology team yet to inform of hospitalization due to the fact that she is so breathless with conversation. She believes that her productive cough is getting better now thanks to antibiotic and mostly due the fact that she has not smoked since discharge. She has had some chills and night sweats at home but these are not as concerning to her considering she is "going through the change" and feels it is more hormonal. She still has some occasional hemoptysis although much improved.   She has no current dental pain and sees a dental team regularly. She has had extractions in the past (2014) due to dental pain while she was immunosuppressed following her stem cell transplant. She still has a swollen  nodule in her left neck where she tells me her jugular vein clot was. She has continued on xarelto for 8 months now and reports that the swelling has gone down significantly but this is still present.   Review of Systems  Constitutional: Positive for chills, diaphoresis and  malaise/fatigue. Negative for weight loss.  HENT: Negative for sore throat.        Swelling to left neck d/t jugular DVT   Eyes: Negative for blurred vision.  Respiratory: Positive for cough, hemoptysis (mild compared to hospitalization), sputum production, shortness of breath and wheezing.   Cardiovascular: Positive for chest pain (coughing ). Negative for orthopnea and leg swelling.  Gastrointestinal: Negative for abdominal pain, diarrhea, nausea and vomiting.  Genitourinary: Negative for dysuria and frequency.  Musculoskeletal: Negative for myalgias.  Skin: Negative for rash.  Neurological: Negative for dizziness and headaches.    Past Medical History:  Diagnosis Date  . AML (acute myeloblastic leukemia) ([place])    s/p bone marrow transplant in 0347 complicated by CMV, colitis, and sepsis  . Anemia   . Hypertension     Outpatient Medications Prior to Visit  Medication Sig Dispense Refill  . Albuterol Sulfate (PROAIR RESPICLICK) 425 (90 Base) MCG/ACT AEPB Inhale 2 puffs into the lungs every 4 (four) hours as needed. (Patient taking differently: Inhale 2 puffs into the lungs every 4 (four) hours as needed (SOB, wheezing). ) 1 each 2  . benzonatate (TESSALON) 200 MG capsule Take 1 capsule (200 mg total) by mouth 3 (three) times daily as needed for cough. 30 capsule 0  . cholecalciferol (VITAMIN D) 1000 UNITS tablet Take 1,000 Units by mouth daily after breakfast.     . cyclobenzaprine (FLEXERIL) 10 MG tablet Take 1 tablet (10 mg total) by mouth daily as needed for muscle spasms. 30 tablet 0  . levofloxacin (LEVAQUIN) 750 MG tablet Take 1 tablet (750 mg total) by mouth daily for 6 days. 6 tablet 0  . nicotine (NICODERM CQ - DOSED IN MG/24 HOURS) 14 mg/24hr patch Place 1 patch (14 mg total) onto the skin daily. 30 patch 0  . omeprazole (PRILOSEC) 20 MG capsule Take 20 mg by mouth at bedtime.     . potassium chloride SA (K-DUR,KLOR-CON) 20 MEQ tablet Take 20 mEq by mouth 3 (three) times  daily.     . Rivaroxaban 15 & 20 MG TBPK Take as directed on package: Start with one '15mg'$  tablet by mouth twice a day with food. On Day 22, switch to one '20mg'$  tablet once a day with food. (Patient taking differently: Take 20 mg by mouth daily. ) 51 each 0   No facility-administered medications prior to visit.      Allergies  Allergen Reactions  . Chlorhexidine Rash  . Other Itching    Transparent dressing causes itching per pt  . Latex Rash    Social History   Tobacco Use  . Smoking status: Former Smoker    Packs/day: 1.00    Types: Cigarettes  . Smokeless tobacco: Never Used  . Tobacco comment: quit 11/29/17  Substance Use Topics  . Alcohol use: Yes    Alcohol/week: 0.6 oz    Types: 1 Glasses of wine per week    Comment: rarely  . Drug use: No    Family History  Problem Relation Age of Onset  . CVA Mother   . Diabetes Mellitus II Father   . Renal Disease Father     Objective:   Vitals:   12/14/17  1002 12/14/17 1011  BP: 107/76   Pulse: (!) 178 (!) 104  Temp: 97.7 F (36.5 C)   TempSrc: Oral   SpO2:  91%  Weight: 160 lb (72.6 kg)    Body mass index is 28.57 kg/m.  Physical Exam  Constitutional: She is oriented to person, place, and time and well-developed, well-nourished, and in no distress.  Very pleasant AA female seated comfortably in chair with box of tissues and her medical records. Accompanied by her mother.   HENT:  Mouth/Throat: Oropharynx is clear and moist. No oral lesions. Normal dentition. No dental abscesses.  Eyes: Pupils are equal, round, and reactive to light.  Neck: No tracheal deviation present.    Cardiovascular: Regular rhythm, S1 normal, S2 normal and intact distal pulses. Tachycardia present. Exam reveals no friction rub.  No murmur heard. Pulmonary/Chest: Tachypnea noted. No respiratory distress. She has decreased breath sounds in the right upper field. She has no wheezes. She has rhonchi in the right middle field and the left upper  field. She has rales in the right middle field and the left upper field.  Dyspneic with conversation. Breaks often after about 10 words or so.   Abdominal: Soft. Normal appearance.  Musculoskeletal: Normal range of motion. She exhibits no edema.  Lymphadenopathy:    She has no cervical adenopathy.  Neurological: She is alert and oriented to person, place, and time. No cranial nerve deficit.  Skin: Skin is warm and dry.  Psychiatric: Memory and affect normal.    Lab Results: Lab Results  Component Value Date   WBC 14.8 (H) 12/14/2017   HGB 14.7 12/14/2017   HCT 41.6 12/14/2017   MCV 89.7 12/14/2017   PLT 337 12/14/2017    Lab Results  Component Value Date   CREATININE 0.97 12/11/2017   BUN 11 12/11/2017   NA 139 12/11/2017   K 4.1 12/11/2017   CL 103 12/11/2017   CO2 25 12/11/2017    Lab Results  Component Value Date   ALT 21 04/03/2017   AST 23 04/03/2017   ALKPHOS 68 04/03/2017   BILITOT 0.9 04/03/2017    IMAGING REVIEW: 06/17/13: Invasive fungal pneumonia by chest CT findings, initially treated with Vfend (started 06/18/13), but transitioned to posaconazole (06/23/13) secondary to worsening CT findings  2016 CT scan: A soft tissue density is noted in the bronchiectatic segment to the middle lobe. This may arise within the bronchus or represent external compression/invasion. This is centrally located, and may be amenable to bronchoscopic evaluation. Bronchiectatic changes and scarring of RML.   04/2017 Chest CT: Redemonstration of a right upper lobe endobronchial mass measuring 2.2 cm with associated surrounding bronchiectasis and parenchymal architectural distortion. This lesion has been minimally changed relative to CT imaging of the chest dating back to 2015, and it likely relates to chronic fungal infection.  04/2017 Head/Neck CT: Deep venous thrombosis in the left subclavian, brachiocephalic, and lower jugular veins  08/2017 Lung Biopsy and bronchial washings -  respiratory mucosa and squamous epithelium with chronic and focal acute inflammation; fragment of tissue with fungal hyphae present on routine H&E section but fungal stains (GMS/PAS) were negative for fungal organisms.  BAL aspergillosis galactomanan - negative   Previous serum aspergillosis galactomanan's also negative.   Pseudomonas >100k colonies - pansensitive   Second strain of Pseudomonas growing 50k colonies - Intermediate to aztreonam  10/10/2017 - respiratory samples negative for AFB  Assessment & Plan:   Problem List Items Addressed This Visit  Respiratory   Endobronchial mass    Reviewed records from [facility] pulmonology team in [place] - I have not found where this mass has been proven to be fungal - sampling at that time was (+) pseudomonas, (-) fungal growth and (-) for aspergillosis galatomanan, which is much more sensitive compared to serum assessment. Previously serum ASG galactomanan Ab negative also. She has had recovered fungal organisms with previous BAL in 2015 (candida glabratta) and fragments of hyphae noted on other samplings without growth.   With positive blood culture for actinomyces I am suspicious that this endobronchial mass may be due to actinomyces infection. In review of literature this presents as a subacute/chronic process and can often resemble pulmonary findings similar to malignancy, tuberculosis and fungal infection. Discussions of case reports utilize 4 weeks of IV penicillin then prolonged oral therapy with amoxicillin; sometimes surgical resection was performed.   I will contact her pulmonologist to discuss her recent hospitalization, findings and see if we can discuss potentially repeating bronchoscopy to validate diagnosis - will need to allow at least 10 days for culture to grow as actinomyces is difficult to grow. If bronchoscopy is not possible I would like to follow up with repeat CT scan of the chest through treatment to see if there is any  resolution of the mass.      Bronchiectasis [facility])    Pulmonary team considering RML resection per OV notes on 10/17/17.       Relevant Orders   Culture, blood (single) (Completed)   Culture, blood (single)   CBC with Differential/Platelet (Completed)     Other   Acute myeloid leukemia in remission ([place])    No longer on immunosuppressives or prophylaxis.       Tobacco use disorder    Congratulated on cessation - I encouraged her to continue this especially considering it is not safe with home oxygen therapy and she has continued to have worsened symptoms.       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 Very unusual to see this organism in the blood. Still with elevated WBC count (14k). Potential sources for infection include dental (although her exam is normal based on my opinion), septic thrombophlebitis of the jugular vein clot that has been present for 8 months, or primary pulmonary actinomycosis with her history of persistent soft tissue endobronchial mass.   Will place PICC line and start PCN 24 million units / day x 4 weeks followed by amoxicillin. Suggested duration of treatment is 6 - 12 months. Ms. Tarlton is in agreement with this plan.   She should also have a follow up with her dentist if she is able. Will repeat BCx today and have lab hold for 14 days to see if any recurrent growth.        She will return nearing the end of the IV treatment. PICC to be pulled at the end of treatment.  Penicilling G 24 million units / 24h IV Duration: 4 weeks Labs: weekly cbc, bmet; one time CRP and ESR with first lab draw.   She will call Monday to make a follow up appointment with her Pulmonologist. Would like to also discuss pulmonary rehab. Recommended she call her oxygen distributor to inform them of possible malfunction in her oxygen - recommended she wear it as recommended  until she has follow up with pulmonology.   Janene Madeira, MSN, NP-C [facility]  Green Pager: [phone] Office: [phone]  12/16/17  2:43 PM

## 2017-12-14 ENCOUNTER — Encounter: Payer: Self-pay | Admitting: Infectious Diseases

## 2017-12-14 ENCOUNTER — Ambulatory Visit (INDEPENDENT_AMBULATORY_CARE_PROVIDER_SITE_OTHER): Payer: Medicare Other | Admitting: Infectious Diseases

## 2017-12-14 VITALS — BP 107/76 | HR 104 | Temp 97.7°F | Wt 160.0 lb

## 2017-12-14 DIAGNOSIS — R918 Other nonspecific abnormal finding of lung field: Secondary | ICD-10-CM | POA: Diagnosis not present

## 2017-12-14 DIAGNOSIS — A429 Actinomycosis, unspecified: Secondary | ICD-10-CM | POA: Diagnosis not present

## 2017-12-14 DIAGNOSIS — C9201 Acute myeloblastic leukemia, in remission: Secondary | ICD-10-CM | POA: Diagnosis not present

## 2017-12-14 DIAGNOSIS — F17200 Nicotine dependence, unspecified, uncomplicated: Secondary | ICD-10-CM | POA: Diagnosis not present

## 2017-12-14 DIAGNOSIS — J470 Bronchiectasis with acute lower respiratory infection: Secondary | ICD-10-CM | POA: Diagnosis not present

## 2017-12-14 LAB — CULTURE, BLOOD (ROUTINE X 2)
Culture: NO GROWTH
SPECIAL REQUESTS: ADEQUATE

## 2017-12-14 NOTE — Patient Instructions (Signed)
Please reach out to schedule an appointment with your pulmonology team. I am going to reach out to discuss you with them as well.   I will check some blood work today and call you with results - It may be 2 weeks before they finalize.   If you have any questions please do not hesitate to call our clinic to get in touch with me. [phone]

## 2017-12-15 ENCOUNTER — Telehealth: Payer: Self-pay | Admitting: Infectious Diseases

## 2017-12-15 DIAGNOSIS — Z452 Encounter for adjustment and management of vascular access device: Secondary | ICD-10-CM

## 2017-12-15 LAB — CBC WITH DIFFERENTIAL/PLATELET
BASOS PCT: 0.2 %
Basophils Absolute: 30 cells/uL (ref 0–200)
Eosinophils Absolute: 104 cells/uL (ref 15–500)
Eosinophils Relative: 0.7 %
HCT: 41.6 % (ref 35.0–45.0)
Hemoglobin: 14.7 g/dL (ref 11.7–15.5)
Lymphs Abs: 2605 cells/uL (ref 850–3900)
MCH: 31.7 pg (ref 27.0–33.0)
MCHC: 35.3 g/dL (ref 32.0–36.0)
MCV: 89.7 fL (ref 80.0–100.0)
MONOS PCT: 8.6 %
MPV: 10 fL (ref 7.5–12.5)
Neutro Abs: 10789 cells/uL — ABNORMAL HIGH (ref 1500–7800)
Neutrophils Relative %: 72.9 %
PLATELETS: 337 10*3/uL (ref 140–400)
RBC: 4.64 10*6/uL (ref 3.80–5.10)
RDW: 12.2 % (ref 11.0–15.0)
TOTAL LYMPHOCYTE: 17.6 %
WBC: 14.8 10*3/uL — ABNORMAL HIGH (ref 3.8–10.8)
WBCMIX: 1273 {cells}/uL — AB (ref 200–950)

## 2017-12-15 NOTE — Telephone Encounter (Signed)
Called to discuss plan with Jessica Vega - highly suspicious she has an actinomyces component to the mass seen in her right middle lobe. Would like to treat with 2-4 weeks of IV penicillin with transition to oral regimen for an additional 6-12 months.   I would still like to reach out to her pulmonology team and discuss with them and get notes from bronchoscopy procedure. Plan will also be to re-scan her chest in a few months to see if she has had any improvement in this lesion.   Alternative source for her bacteremia could be jugular vein thrombus - may be that it is infected clot.   Home Health Coordinator through [place] @ [phone] ext [phone].   Will notify our clinic team and home health liaison Carolynn Sayers to see if she can help coordinate where meds will be dispensed, services provided, etc. Will need test dose in short stay as well.   Jessica Vega has no further questions at this time. She will continue her present course of Levaquin and reach out to her pulmonology team Monday for an appointment.   Janene Madeira, NP

## 2017-12-16 DIAGNOSIS — A429 Actinomycosis, unspecified: Secondary | ICD-10-CM | POA: Insufficient documentation

## 2017-12-16 NOTE — Assessment & Plan Note (Addendum)
Reviewed records from [facility] pulmonology team in [place] - I have not found where this mass has been proven to be fungal - sampling at that time was (+) pseudomonas, (-) fungal growth and (-) for aspergillosis galatomanan, which is much more sensitive compared to serum assessment. Previously serum ASG galactomanan Ab negative also. She has had recovered fungal organisms with previous BAL in 2015 (candida glabratta) and fragments of hyphae noted on other samplings without growth.   With positive blood culture for actinomyces I am suspicious that this endobronchial mass may be due to actinomyces infection. In review of literature this presents as a subacute/chronic process and can often resemble pulmonary findings similar to malignancy, tuberculosis and fungal infection. Discussions of case reports utilize 4 weeks of IV penicillin then prolonged oral therapy with amoxicillin; sometimes surgical resection was performed.   I will contact her pulmonologist to discuss her recent hospitalization, findings and see if we can discuss potentially repeating bronchoscopy to validate diagnosis - will need to allow at least 10 days for culture to grow as actinomyces is difficult to grow. If bronchoscopy is not possible I would like to follow up with repeat CT scan of the chest through treatment to see if there is any resolution of the mass.

## 2017-12-16 NOTE — Assessment & Plan Note (Signed)
No longer on immunosuppressives or prophylaxis.

## 2017-12-16 NOTE — Assessment & Plan Note (Signed)
Congratulated on cessation - I encouraged her to continue this especially considering it is not safe with home oxygen therapy and she has continued to have worsened symptoms.

## 2017-12-16 NOTE — Assessment & Plan Note (Addendum)
Blood cultures from recent hospitalization (+) 1/4 bottles for actinomyces odontolyticus. Very unusual to see this organism in the blood. Still with elevated WBC count (14k). Potential sources for infection include dental (although her exam is normal based on my opinion), septic thrombophlebitis of the jugular vein clot that has been present for 8 months, or primary pulmonary actinomycosis with her history of persistent soft tissue endobronchial mass.   Will place PICC line and start PCN 24 million units / day x 4 weeks followed by amoxicillin. Suggested duration of treatment is 6 - 12 months. Ms. Balster is in agreement with this plan.   She should also have a follow up with her dentist if she is able. Will repeat BCx today and have lab hold for 14 days to see if any recurrent growth.

## 2017-12-16 NOTE — Assessment & Plan Note (Signed)
Pulmonary team considering RML resection per OV notes on 10/17/17.

## 2017-12-17 NOTE — Progress Notes (Signed)
Sounds good to me, just keep me in the loop!

## 2017-12-18 ENCOUNTER — Telehealth: Payer: Self-pay | Admitting: *Deleted

## 2017-12-18 NOTE — Telephone Encounter (Signed)
-----   Message from Larry Sierras sent at 12/18/2017  3:31 PM EDT ----- Contact: [phone] Thanks Sharyn Lull. We will need to get the auth from them which can take 48 hours.  So...any push to expedite this is helpful.  I called Legrand Como at [facility] from the [employer] gave me but she was not the right contact.  I see you have correct name/# below and I just left Misty a VM message.  Will keep in touch.  Thanks!  Pam~ ----- Message ----- From: Landis Gandy, RN Sent: 12/18/2017   2:16 PM To: Chauncey thing! I called the [facility], left a message with Lieutenant Diego, Nurse Navigator, at [phone].   What do you need from me, other than appointment to schedule the PICC/First dose? Sharyn Lull ----- Message ----- From: Larry Sierras Sent: 12/18/2017  12:24 PM To: Landis Gandy, RN  Hi Clemon Devaul- Happy Monday. I am also working with the [facility] on the home infusion/financial side for Ms. Domingo Cocking.  I will follow with you in Epic but let me know when you have any update on confirmed PICC date/Short Stay first dose.  [facility] can be a smidge challenging with the process which moves [place]. :)  Thanks much.  Pam~ [phone]  ----- Message ----- From: [place] Callas, NP Sent: 12/15/2017   4:29 PM To: Larry Sierras  No worries at all! Happy to see they make it to you no matter how you send them back. She is indeed very nice. We are going to start her on continuous penicillin 24 million units/day x 4 weeks.   I hope the contact I included in the phone message is helpful for [facility]... It is very challenging to get a human on the phone.   I will be in the hospital with Jenny Reichmann next week so feel free to give me a shout   ----- Message ----- From: Larry Sierras Sent: 12/15/2017   3:44 PM To: [place] Callas, NP  Lonna Cobb- I did receive the referral info on Ms. Domingo Cocking.  I will start to work on this today. [facility] is always challenging so we will see how it  goes. I did call the pt to make initial contact. Very nice lady. Told her we would connect next week once we knew more about plans for IV ABX.  Thanks for heads up and have a good weekend!  P.S.  I can't respond to your Patient call messages. No big deal, but just letting you know why I send a new email when I respond.  Pam~

## 2017-12-18 NOTE — Telephone Encounter (Signed)
Left message for Anderson Malta in IR to schedule patient. Asked for a couple of days notice for appointment so proper authorization can be acquired from [place]. Landis Gandy, RN

## 2017-12-18 NOTE — Telephone Encounter (Signed)
Thank you :)

## 2017-12-20 ENCOUNTER — Other Ambulatory Visit: Payer: Self-pay | Admitting: Pharmacist Clinician (PhC)/ Clinical Pharmacy Specialist

## 2017-12-20 MED ORDER — AMOXICILLIN 500 MG PO CAPS: 500.0000 mg | ORAL_CAPSULE | Freq: Four times a day (QID) | ORAL | 2 refills | Status: DC

## 2017-12-20 NOTE — Progress Notes (Signed)
Sent in amoxicillin 500mg  QID x 28 caps with 2 refills for bridging until her IV abx is approved through the [place]. She will pick up at [facility] on [address]. It's on the $4 list.

## 2017-12-21 ENCOUNTER — Telehealth: Payer: Self-pay | Admitting: *Deleted

## 2017-12-21 NOTE — Telephone Encounter (Signed)
Patient was scheduled by IR for PICC placement 3/25. As patient will need continuous antibiotics, she will receive first dose at home via [place]. No need to go to Short Stay after PICC placement. Orders faxed to [facility]. Landis Gandy, RN

## 2017-12-22 NOTE — Telephone Encounter (Signed)
Excellent news! Love when a plan comes together. Thank you for your help coordinating this for her.

## 2017-12-25 ENCOUNTER — Other Ambulatory Visit: Payer: Self-pay | Admitting: Infectious Diseases

## 2017-12-25 ENCOUNTER — Ambulatory Visit (HOSPITAL_COMMUNITY)
Admission: RE | Admit: 2017-12-25 | Discharge: 2017-12-25 | Disposition: A | Discharge status: Home / Self Care | Payer: Medicare Other | Source: Ambulatory Visit | Attending: Infectious Diseases | Admitting: Infectious Diseases

## 2017-12-25 DIAGNOSIS — Z452 Encounter for adjustment and management of vascular access device: Secondary | ICD-10-CM | POA: Diagnosis not present

## 2017-12-25 DIAGNOSIS — D649 Anemia, unspecified: Secondary | ICD-10-CM | POA: Diagnosis not present

## 2017-12-25 DIAGNOSIS — Z9981 Dependence on supplemental oxygen: Secondary | ICD-10-CM | POA: Diagnosis not present

## 2017-12-25 DIAGNOSIS — Z792 Long term (current) use of antibiotics: Secondary | ICD-10-CM | POA: Diagnosis not present

## 2017-12-25 DIAGNOSIS — A429 Actinomycosis, unspecified: Secondary | ICD-10-CM | POA: Diagnosis not present

## 2017-12-25 DIAGNOSIS — Z5181 Encounter for therapeutic drug level monitoring: Secondary | ICD-10-CM | POA: Diagnosis not present

## 2017-12-25 DIAGNOSIS — J479 Bronchiectasis, uncomplicated: Secondary | ICD-10-CM | POA: Diagnosis not present

## 2017-12-25 DIAGNOSIS — J189 Pneumonia, unspecified organism: Secondary | ICD-10-CM | POA: Diagnosis not present

## 2017-12-25 DIAGNOSIS — I10 Essential (primary) hypertension: Secondary | ICD-10-CM | POA: Diagnosis not present

## 2017-12-25 DIAGNOSIS — Z86718 Personal history of other venous thrombosis and embolism: Secondary | ICD-10-CM | POA: Diagnosis not present

## 2017-12-25 DIAGNOSIS — C9201 Acute myeloblastic leukemia, in remission: Secondary | ICD-10-CM | POA: Diagnosis not present

## 2017-12-25 DIAGNOSIS — Z7901 Long term (current) use of anticoagulants: Secondary | ICD-10-CM | POA: Diagnosis not present

## 2017-12-25 DIAGNOSIS — Z87891 Personal history of nicotine dependence: Secondary | ICD-10-CM | POA: Diagnosis not present

## 2017-12-25 DIAGNOSIS — Z9481 Bone marrow transplant status: Secondary | ICD-10-CM | POA: Diagnosis not present

## 2017-12-25 MED ORDER — LIDOCAINE HCL (PF) 1 % IJ SOLN
INTRAMUSCULAR | Status: DC | PRN
  Administered 2017-12-25: 5 mL

## 2017-12-25 MED ORDER — LIDOCAINE HCL 1 % IJ SOLN
INTRAMUSCULAR | Status: AC
  Filled 2017-12-25: qty 20

## 2017-12-25 NOTE — Procedures (Signed)
Pneumonia  S/p RUE SL POWER PICC TIP SVCRA NO COMP STABLE READY FOR USE

## 2017-12-25 NOTE — Telephone Encounter (Signed)
Sure thing! Per Jackelyn Poling at [facility], the nurse will be out to Jessica Vega home between 2-3pm today to start her IV medication.

## 2017-12-26 DIAGNOSIS — J479 Bronchiectasis, uncomplicated: Secondary | ICD-10-CM | POA: Diagnosis not present

## 2017-12-26 DIAGNOSIS — Z452 Encounter for adjustment and management of vascular access device: Secondary | ICD-10-CM | POA: Diagnosis not present

## 2017-12-26 DIAGNOSIS — D649 Anemia, unspecified: Secondary | ICD-10-CM | POA: Diagnosis not present

## 2017-12-26 DIAGNOSIS — I10 Essential (primary) hypertension: Secondary | ICD-10-CM | POA: Diagnosis not present

## 2017-12-26 DIAGNOSIS — A429 Actinomycosis, unspecified: Secondary | ICD-10-CM | POA: Diagnosis not present

## 2017-12-26 DIAGNOSIS — C9201 Acute myeloblastic leukemia, in remission: Secondary | ICD-10-CM | POA: Diagnosis not present

## 2017-12-28 DIAGNOSIS — C9201 Acute myeloblastic leukemia, in remission: Secondary | ICD-10-CM | POA: Diagnosis not present

## 2017-12-28 DIAGNOSIS — Z452 Encounter for adjustment and management of vascular access device: Secondary | ICD-10-CM | POA: Diagnosis not present

## 2017-12-28 DIAGNOSIS — I10 Essential (primary) hypertension: Secondary | ICD-10-CM | POA: Diagnosis not present

## 2017-12-28 DIAGNOSIS — J479 Bronchiectasis, uncomplicated: Secondary | ICD-10-CM | POA: Diagnosis not present

## 2017-12-28 DIAGNOSIS — D649 Anemia, unspecified: Secondary | ICD-10-CM | POA: Diagnosis not present

## 2017-12-28 DIAGNOSIS — A429 Actinomycosis, unspecified: Secondary | ICD-10-CM | POA: Diagnosis not present

## 2017-12-29 ENCOUNTER — Other Ambulatory Visit: Payer: Self-pay | Admitting: Pharmacist

## 2017-12-29 LAB — CULTURE, BLOOD (SINGLE)
MICRO NUMBER: 90325253
MICRO NUMBER:: 90325255
RESULT: NO GROWTH
Result:: NO GROWTH
SPECIMEN QUALITY: ADEQUATE
SPECIMEN QUALITY:: ADEQUATE

## 2017-12-31 DIAGNOSIS — C9201 Acute myeloblastic leukemia, in remission: Secondary | ICD-10-CM | POA: Diagnosis not present

## 2017-12-31 DIAGNOSIS — A429 Actinomycosis, unspecified: Secondary | ICD-10-CM | POA: Diagnosis not present

## 2017-12-31 DIAGNOSIS — I10 Essential (primary) hypertension: Secondary | ICD-10-CM | POA: Diagnosis not present

## 2017-12-31 DIAGNOSIS — J479 Bronchiectasis, uncomplicated: Secondary | ICD-10-CM | POA: Diagnosis not present

## 2017-12-31 DIAGNOSIS — Z452 Encounter for adjustment and management of vascular access device: Secondary | ICD-10-CM | POA: Diagnosis not present

## 2017-12-31 DIAGNOSIS — D649 Anemia, unspecified: Secondary | ICD-10-CM | POA: Diagnosis not present

## 2018-01-01 DIAGNOSIS — Z452 Encounter for adjustment and management of vascular access device: Secondary | ICD-10-CM | POA: Diagnosis not present

## 2018-01-01 DIAGNOSIS — C9201 Acute myeloblastic leukemia, in remission: Secondary | ICD-10-CM | POA: Diagnosis not present

## 2018-01-01 DIAGNOSIS — A429 Actinomycosis, unspecified: Secondary | ICD-10-CM | POA: Diagnosis not present

## 2018-01-01 DIAGNOSIS — D649 Anemia, unspecified: Secondary | ICD-10-CM | POA: Diagnosis not present

## 2018-01-01 DIAGNOSIS — J479 Bronchiectasis, uncomplicated: Secondary | ICD-10-CM | POA: Diagnosis not present

## 2018-01-01 DIAGNOSIS — I10 Essential (primary) hypertension: Secondary | ICD-10-CM | POA: Diagnosis not present

## 2018-01-04 DIAGNOSIS — A429 Actinomycosis, unspecified: Secondary | ICD-10-CM | POA: Diagnosis not present

## 2018-01-04 DIAGNOSIS — C9201 Acute myeloblastic leukemia, in remission: Secondary | ICD-10-CM | POA: Diagnosis not present

## 2018-01-04 DIAGNOSIS — I10 Essential (primary) hypertension: Secondary | ICD-10-CM | POA: Diagnosis not present

## 2018-01-04 DIAGNOSIS — D649 Anemia, unspecified: Secondary | ICD-10-CM | POA: Diagnosis not present

## 2018-01-04 DIAGNOSIS — Z452 Encounter for adjustment and management of vascular access device: Secondary | ICD-10-CM | POA: Diagnosis not present

## 2018-01-04 DIAGNOSIS — J479 Bronchiectasis, uncomplicated: Secondary | ICD-10-CM | POA: Diagnosis not present

## 2018-01-08 DIAGNOSIS — I10 Essential (primary) hypertension: Secondary | ICD-10-CM | POA: Diagnosis not present

## 2018-01-08 DIAGNOSIS — Z452 Encounter for adjustment and management of vascular access device: Secondary | ICD-10-CM | POA: Diagnosis not present

## 2018-01-08 DIAGNOSIS — C9201 Acute myeloblastic leukemia, in remission: Secondary | ICD-10-CM | POA: Diagnosis not present

## 2018-01-08 DIAGNOSIS — D649 Anemia, unspecified: Secondary | ICD-10-CM | POA: Diagnosis not present

## 2018-01-08 DIAGNOSIS — A429 Actinomycosis, unspecified: Secondary | ICD-10-CM | POA: Diagnosis not present

## 2018-01-08 DIAGNOSIS — J479 Bronchiectasis, uncomplicated: Secondary | ICD-10-CM | POA: Diagnosis not present

## 2018-01-09 ENCOUNTER — Telehealth: Payer: Self-pay | Admitting: Infectious Diseases

## 2018-01-09 ENCOUNTER — Ambulatory Visit (INDEPENDENT_AMBULATORY_CARE_PROVIDER_SITE_OTHER): Payer: Medicare Other | Admitting: Infectious Diseases

## 2018-01-09 ENCOUNTER — Encounter: Payer: Self-pay | Admitting: Infectious Diseases

## 2018-01-09 VITALS — BP 126/84 | HR 94 | Temp 97.4°F | Ht 62.75 in | Wt 171.0 lb

## 2018-01-09 DIAGNOSIS — R918 Other nonspecific abnormal finding of lung field: Secondary | ICD-10-CM

## 2018-01-09 DIAGNOSIS — A429 Actinomycosis, unspecified: Secondary | ICD-10-CM

## 2018-01-09 DIAGNOSIS — F17200 Nicotine dependence, unspecified, uncomplicated: Secondary | ICD-10-CM | POA: Diagnosis not present

## 2018-01-09 DIAGNOSIS — J470 Bronchiectasis with acute lower respiratory infection: Secondary | ICD-10-CM

## 2018-01-09 MED ORDER — AMOXICILLIN 500 MG PO CAPS: 500.0000 mg | ORAL_CAPSULE | Freq: Four times a day (QID) | ORAL | 3 refills | Status: AC

## 2018-01-09 NOTE — Telephone Encounter (Signed)
Can you please call her pulmonologist Dr. Kathi Ludwig at [facility] @ Phone: [phone] Fax: [phone] to see if he was able to get the CT scan we did at [facility] on 12/09/2017? Our radiology department tells me that it was uploaded so they should be able to access it. If not can we have our radiology team prepare a CD to mail to him for comparison? In the mean time can you please fax over the radiology report and today's visit to his team.   Thank you very much.  ~Kinslei Labine

## 2018-01-09 NOTE — Patient Instructions (Addendum)
PICC line can come out on April 22nd.   Once this happens I want you to resume taking your Amoxicillin four times a day. Please continue this medication until we see you again in 3 months.   Will arrange for repeat CT scan of your lungs.

## 2018-01-09 NOTE — Progress Notes (Signed)
Patient: Jessica Vega  DOB: 12-15-1960 MRN: [id number] PCP: Tennis Must, MD  Referring Provider: Hospital Follow Up   Chief Complaint  Patient presents with  . Follow-up    actinomyces blood stream infection     Patient Active Problem List   Diagnosis Date Noted  . 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 Bronchiectasis ([place]) 12/09/2017  . DVT (deep venous thrombosis) ([place]) 04/04/2017  . Acute deep vein thrombosis (DVT) of brachial vein of left upper extremity ([place]) 04/03/2017  . Tobacco use disorder 04/03/2017  . Endobronchial mass 04/03/2017  . Chest pain 01/16/2017  . Dyspnea 01/16/2017  . Acute myeloid leukemia in remission ([place]) 01/16/2017  . History of pericarditis 01/16/2017  . HTN (hypertension) 01/16/2017  . Atrial flutter ([place])   . Other pancytopenia ([place]) 04/30/2013     Subjective:   Chief Complaint  Patient presents with  . Follow-up    actinomyces blood stream infection    Brief Narrative:  Jessica Vega is a 57 y.o. 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 Has been working with [facility] since 07/2017 for tx of newly diagnosed bronchiectasis, worsening dyspnea on exertion/at rest and persistent pulmonary intraluminal mass. She has had numerous bronchoscopies over the years per chart review yielding pseudomonas, moraxella catarrhalis, candida glabrata (07/2014 - fungemic during presentation and tx with 4x IV antifungals then secondary prophylaxis), penicillium, saprophytic fungus. Most recently bronchoscopy was performed in November 2018 - cultures were positive for pseudomonas only and she was treated with course of Levaquin. Hospitalization at [facility] in early March 2019 with hemoptysis and  pneumonia. Treated with Levaquin. Blood cultures positive for actinomyces odontolyticus at that time. Possibility that what has been presumed to be fungal myectoma may actually be pulmonary actinomyces infection.   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 She is not as breathless as she was previously and attributes this to smoking cessation. She is very stressed about her health and is having a hard time dealing with and adapting to stress without reaching for a cigarette. Planning on hypnosis sessions to help with this. She is able to stay off her oxygen at home and reports the home health nurse always tells her she has a 95-97% oxygen saturation at rest. She has tolerated her antibiotic well without side effect aside from PICC line being a nuisance and burden for her. Denies fevers, chills, rashes or swelling. She has had no evidence of hemoptysis since starting treatment and is overall improved from pulmonary stand point. Cough is not as frequent and phlegm is less. She has had follow up with her pulmonology team and reported she does not have to come back until September.   Review of Systems  Constitutional: Negative for chills, diaphoresis, fever, malaise/fatigue and weight loss.  HENT: Negative for sore throat.        Swelling to left neck d/t jugular DVT   Eyes: Negative for blurred vision.  Respiratory: Positive for cough and sputum production (clear sputum). Negative for hemoptysis, shortness of breath and wheezing.   Cardiovascular: Negative for chest pain, orthopnea and leg swelling.  Gastrointestinal: Negative for abdominal pain, diarrhea, nausea and vomiting.  Genitourinary: Negative for dysuria and  frequency.  Musculoskeletal: Negative for myalgias.  Skin: Negative for rash.  Neurological: Negative for dizziness and headaches.    Psychiatric/Behavioral: The patient is nervous/anxious.     Past Medical History:  Diagnosis Date  . AML (acute myeloblastic leukemia) ([place])    s/p bone marrow transplant in 2355 complicated by CMV, colitis, and sepsis  . Anemia   . Hypertension     Outpatient Medications Prior to Visit  Medication Sig Dispense Refill  . Albuterol Sulfate (PROAIR RESPICLICK) 732 (90 Base) MCG/ACT AEPB Inhale 2 puffs into the lungs every 4 (four) hours as needed. (Patient taking differently: Inhale 2 puffs into the lungs every 4 (four) hours as needed (SOB, wheezing). ) 1 each 2  . benzonatate (TESSALON) 200 MG capsule Take 1 capsule (200 mg total) by mouth 3 (three) times daily as needed for cough. 30 capsule 0  . cholecalciferol (VITAMIN D) 1000 UNITS tablet Take 1,000 Units by mouth daily after breakfast.     . cyclobenzaprine (FLEXERIL) 10 MG tablet Take 1 tablet (10 mg total) by mouth daily as needed for muscle spasms. 30 tablet 0  . omeprazole (PRILOSEC) 20 MG capsule Take 20 mg by mouth at bedtime.     . Rivaroxaban 15 & 20 MG TBPK Take as directed on package: Start with one 15mg  tablet by mouth twice a day with food. On Day 22, switch to one 20mg  tablet once a day with food. (Patient taking differently: Take 20 mg by mouth daily. ) 51 each 0  . nicotine (NICODERM CQ - DOSED IN MG/24 HOURS) 14 mg/24hr patch Place 1 patch (14 mg total) onto the skin daily. (Patient not taking: Reported on 01/09/2018) 30 patch 0  . potassium chloride SA (K-DUR,KLOR-CON) 20 MEQ tablet Take 20 mEq by mouth 3 (three) times daily.     Marland Kitchen amoxicillin (AMOXIL) 500 MG capsule Take 1 capsule (500 mg total) by mouth 4 (four) times daily. (Patient not taking: Reported on 01/09/2018) 28 capsule 2   No facility-administered medications prior to visit.      Allergies  Allergen Reactions  . Chlorhexidine Rash  . Other Itching    Transparent dressing causes itching per pt  . Latex Rash    Social History   Tobacco Use  .  Smoking status: Former Smoker    Packs/day: 1.00    Types: Cigarettes  . Smokeless tobacco: Never Used  . Tobacco comment: quit 11/29/17  Substance Use Topics  . Alcohol use: Yes    Alcohol/week: 0.6 oz    Types: 1 Glasses of wine per week    Comment: rarely  . Drug use: No    Family History  Problem Relation Age of Onset  . CVA Mother   . Diabetes Mellitus II Father   . Renal Disease Father     Objective:   Vitals:   01/09/18 1528  BP: 126/84  Pulse: 94  Temp: (!) 97.4 F (36.3 C)  TempSrc: Oral  Weight: 171 lb (77.6 kg)  Height: 5' 2.75" (1.594 m)   Body mass index is 30.53 kg/m.  Physical Exam  Constitutional: She is oriented to person, place, and time and well-developed, well-nourished, and in no distress.  Very pleasant AA female seated comfortably in chair. Accompanied by her mother.   HENT:  Mouth/Throat: Oropharynx is clear and moist. No oral lesions. Normal dentition. No dental abscesses.  Eyes: Pupils are equal, round, and reactive to light. No scleral icterus.  Neck: No tracheal deviation present.  Cardiovascular: Normal rate, regular rhythm, S1 normal, S2 normal, normal heart sounds and intact distal pulses. Exam reveals no friction rub.  No murmur heard. Pulmonary/Chest: No tachypnea. No respiratory distress. She has no decreased breath sounds. She has no wheezes. She has no rhonchi. She has no rales.  No conversational dyspnea.   Abdominal: Soft. Normal appearance.  Musculoskeletal: Normal range of motion. She exhibits no edema.  Lymphadenopathy:    She has no cervical adenopathy.  Neurological: She is alert and oriented to person, place, and time. No cranial nerve deficit.  Skin: Skin is warm and dry.  Psychiatric: Memory and affect normal.    Lab Results: Lab Results  Component Value Date   WBC 14.8 (H) 12/14/2017   HGB 14.7 12/14/2017   HCT 41.6 12/14/2017   MCV 89.7 12/14/2017   PLT 337 12/14/2017    Lab Results  Component Value  Date   CREATININE 0.97 12/11/2017   BUN 11 12/11/2017   NA 139 12/11/2017   K 4.1 12/11/2017   CL 103 12/11/2017   CO2 25 12/11/2017    Lab Results  Component Value Date   ALT 21 04/03/2017   AST 23 04/03/2017   ALKPHOS 68 04/03/2017   BILITOT 0.9 04/03/2017    IMAGING REVIEW: 06/17/13: Invasive fungal pneumonia by chest CT findings, initially treated with Vfend (started 06/18/13), but transitioned to posaconazole (06/23/13) secondary to worsening CT findings  2016 CT scan: A soft tissue density is noted in the bronchiectatic segment to the middle lobe. This may arise within the bronchus or represent external compression/invasion. This is centrally located, and may be amenable to bronchoscopic evaluation. Bronchiectatic changes and scarring of RML.   04/2017 Chest CT: Redemonstration of a right upper lobe endobronchial mass measuring 2.2 cm with associated surrounding bronchiectasis and parenchymal architectural distortion. This lesion has been minimally changed relative to CT imaging of the chest dating back to 2015, and it likely relates to chronic fungal infection.  04/2017 Head/Neck CT: Deep venous thrombosis in the left subclavian, brachiocephalic, and lower jugular veins  08/2017 Lung Biopsy and bronchial washings - respiratory mucosa and squamous epithelium with chronic and focal acute inflammation; fragment of tissue with fungal hyphae present on routine H&E section but fungal stains (GMS/PAS) were negative for fungal organisms.  BAL aspergillosis galactomanan - negative   Previous serum aspergillosis galactomanan's also negative.   Pseudomonas >100k colonies - pansensitive   Second strain of Pseudomonas growing 50k colonies - Intermediate to aztreonam  10/10/2017 - respiratory samples negative for AFB  Assessment & Plan:   Problem List Items Addressed This Visit      Respiratory   Endobronchial mass    Continue to work with pulmonology team. Would like to consider  repeat CT scan vs bronchoscopy after 4-6 months of treatment to evaluate for any resolution of this mass.       Bronchiectasis ([place])    Pulmonary assessment normal today. Very minimal observed cough during encounter. Follow up per pulmonology.         Other   Tobacco use disorder    Struggling with emotional burden of trying to stay quit. Offered counseling services however she will work with her hypnotist on this. I advised her to try to find some things that bring her joy and keep her mind busy.       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 Continue with end date on April 22nd. OK to remove PICC  at that time. She will transition to oral Amoxicillin therapy 500 mg QID after that until she returns for follow up. She will return in 3 months.         Janene Madeira, MSN, NP-C [facility] [place] Pager: [phone] Office: [phone]  01/10/18

## 2018-01-10 NOTE — Assessment & Plan Note (Signed)
Pulmonary assessment normal today. Very minimal observed cough during encounter. Follow up per pulmonology.

## 2018-01-10 NOTE — Assessment & Plan Note (Addendum)
Continue to work with pulmonology team. Would like to consider repeat CT scan vs bronchoscopy after 4-6 months of treatment to evaluate for any resolution of this mass.

## 2018-01-10 NOTE — Assessment & Plan Note (Addendum)
Tolerating PCN infusion well. Continue with end date on April 22nd. OK to remove PICC at that time. She will transition to oral Amoxicillin therapy 500 mg QID after that until she returns for follow up. She will return in 3 months.

## 2018-01-10 NOTE — Assessment & Plan Note (Signed)
Struggling with emotional burden of trying to stay quit. Offered counseling services however she will work with her hypnotist on this. I advised her to try to find some things that bring her joy and keep her mind busy.

## 2018-01-10 NOTE — Telephone Encounter (Signed)
Called Dr. Army Melia office left a message with receptionist.  She states Dr. Army Melia nurse was unavailable and will have to call back at a later time.  Will fax radiology report and last office visit notes to [phone]. Pricilla Riffle RN

## 2018-01-12 ENCOUNTER — Other Ambulatory Visit: Payer: Self-pay | Admitting: Pharmacist

## 2018-01-15 DIAGNOSIS — J479 Bronchiectasis, uncomplicated: Secondary | ICD-10-CM | POA: Diagnosis not present

## 2018-01-15 DIAGNOSIS — I10 Essential (primary) hypertension: Secondary | ICD-10-CM | POA: Diagnosis not present

## 2018-01-15 DIAGNOSIS — Z452 Encounter for adjustment and management of vascular access device: Secondary | ICD-10-CM | POA: Diagnosis not present

## 2018-01-15 DIAGNOSIS — C9201 Acute myeloblastic leukemia, in remission: Secondary | ICD-10-CM | POA: Diagnosis not present

## 2018-01-15 DIAGNOSIS — A429 Actinomycosis, unspecified: Secondary | ICD-10-CM | POA: Diagnosis not present

## 2018-01-15 DIAGNOSIS — D649 Anemia, unspecified: Secondary | ICD-10-CM | POA: Diagnosis not present

## 2018-01-16 ENCOUNTER — Inpatient Hospital Stay: Payer: Non-veteran care | Admitting: Internal Medicine

## 2018-01-18 ENCOUNTER — Other Ambulatory Visit: Payer: Self-pay | Admitting: Pharmacist

## 2018-01-22 DIAGNOSIS — J479 Bronchiectasis, uncomplicated: Secondary | ICD-10-CM | POA: Diagnosis not present

## 2018-01-22 DIAGNOSIS — D649 Anemia, unspecified: Secondary | ICD-10-CM | POA: Diagnosis not present

## 2018-01-22 DIAGNOSIS — I10 Essential (primary) hypertension: Secondary | ICD-10-CM | POA: Diagnosis not present

## 2018-01-22 DIAGNOSIS — C9201 Acute myeloblastic leukemia, in remission: Secondary | ICD-10-CM | POA: Diagnosis not present

## 2018-01-22 DIAGNOSIS — Z452 Encounter for adjustment and management of vascular access device: Secondary | ICD-10-CM | POA: Diagnosis not present

## 2018-01-22 DIAGNOSIS — A429 Actinomycosis, unspecified: Secondary | ICD-10-CM | POA: Diagnosis not present

## 2018-01-29 DIAGNOSIS — Z452 Encounter for adjustment and management of vascular access device: Secondary | ICD-10-CM | POA: Diagnosis not present

## 2018-01-29 DIAGNOSIS — C9201 Acute myeloblastic leukemia, in remission: Secondary | ICD-10-CM | POA: Diagnosis not present

## 2018-01-29 DIAGNOSIS — A429 Actinomycosis, unspecified: Secondary | ICD-10-CM | POA: Diagnosis not present

## 2018-01-29 DIAGNOSIS — I10 Essential (primary) hypertension: Secondary | ICD-10-CM | POA: Diagnosis not present

## 2018-01-29 DIAGNOSIS — J479 Bronchiectasis, uncomplicated: Secondary | ICD-10-CM | POA: Diagnosis not present

## 2018-01-29 DIAGNOSIS — D649 Anemia, unspecified: Secondary | ICD-10-CM | POA: Diagnosis not present

## 2018-02-05 DIAGNOSIS — A429 Actinomycosis, unspecified: Secondary | ICD-10-CM | POA: Diagnosis not present

## 2018-02-05 DIAGNOSIS — J479 Bronchiectasis, uncomplicated: Secondary | ICD-10-CM | POA: Diagnosis not present

## 2018-02-05 DIAGNOSIS — D649 Anemia, unspecified: Secondary | ICD-10-CM | POA: Diagnosis not present

## 2018-02-05 DIAGNOSIS — I10 Essential (primary) hypertension: Secondary | ICD-10-CM | POA: Diagnosis not present

## 2018-02-05 DIAGNOSIS — C9201 Acute myeloblastic leukemia, in remission: Secondary | ICD-10-CM | POA: Diagnosis not present

## 2018-02-05 DIAGNOSIS — Z452 Encounter for adjustment and management of vascular access device: Secondary | ICD-10-CM | POA: Diagnosis not present

## 2018-03-15 ENCOUNTER — Encounter (HOSPITAL_COMMUNITY): Payer: Self-pay | Admitting: *Deleted

## 2018-03-15 ENCOUNTER — Other Ambulatory Visit: Payer: Self-pay

## 2018-03-15 ENCOUNTER — Emergency Department (HOSPITAL_BASED_OUTPATIENT_CLINIC_OR_DEPARTMENT_OTHER)
Admit: 2018-03-15 | Discharge: 2018-03-15 | Disposition: A | Discharge status: Home / Self Care | Payer: Medicare Other | Attending: Emergency Medicine | Admitting: Emergency Medicine

## 2018-03-15 ENCOUNTER — Emergency Department (HOSPITAL_COMMUNITY)
Admission: EM | Admit: 2018-03-15 | Discharge: 2018-03-15 | Disposition: A | Discharge status: Home / Self Care | Payer: Medicare Other | Attending: Emergency Medicine | Admitting: Emergency Medicine

## 2018-03-15 DIAGNOSIS — M542 Cervicalgia: Secondary | ICD-10-CM | POA: Diagnosis not present

## 2018-03-15 DIAGNOSIS — S46812A Strain of other muscles, fascia and tendons at shoulder and upper arm level, left arm, initial encounter: Secondary | ICD-10-CM | POA: Insufficient documentation

## 2018-03-15 DIAGNOSIS — Z91040 Latex allergy status: Secondary | ICD-10-CM | POA: Insufficient documentation

## 2018-03-15 DIAGNOSIS — Z7901 Long term (current) use of anticoagulants: Secondary | ICD-10-CM | POA: Diagnosis not present

## 2018-03-15 DIAGNOSIS — Y999 Unspecified external cause status: Secondary | ICD-10-CM | POA: Diagnosis not present

## 2018-03-15 DIAGNOSIS — Y929 Unspecified place or not applicable: Secondary | ICD-10-CM | POA: Diagnosis not present

## 2018-03-15 DIAGNOSIS — I10 Essential (primary) hypertension: Secondary | ICD-10-CM | POA: Insufficient documentation

## 2018-03-15 DIAGNOSIS — Z79899 Other long term (current) drug therapy: Secondary | ICD-10-CM | POA: Diagnosis not present

## 2018-03-15 DIAGNOSIS — Y939 Activity, unspecified: Secondary | ICD-10-CM | POA: Insufficient documentation

## 2018-03-15 DIAGNOSIS — X58XXXA Exposure to other specified factors, initial encounter: Secondary | ICD-10-CM | POA: Diagnosis not present

## 2018-03-15 DIAGNOSIS — M79602 Pain in left arm: Secondary | ICD-10-CM | POA: Insufficient documentation

## 2018-03-15 DIAGNOSIS — Z87891 Personal history of nicotine dependence: Secondary | ICD-10-CM | POA: Diagnosis not present

## 2018-03-15 DIAGNOSIS — M79609 Pain in unspecified limb: Secondary | ICD-10-CM

## 2018-03-15 DIAGNOSIS — S4992XA Unspecified injury of left shoulder and upper arm, initial encounter: Secondary | ICD-10-CM | POA: Diagnosis present

## 2018-03-15 LAB — BASIC METABOLIC PANEL
Anion gap: 10 (ref 5–15)
BUN: 11 mg/dL (ref 6–20)
CHLORIDE: 101 mmol/L (ref 101–111)
CO2: 29 mmol/L (ref 22–32)
CREATININE: 1.05 mg/dL — AB (ref 0.44–1.00)
Calcium: 8.7 mg/dL — ABNORMAL LOW (ref 8.9–10.3)
GFR calc Af Amer: >60 mL/min (ref >60)
GFR calc non Af Amer: 58 mL/min — ABNORMAL LOW (ref >60)
Glucose, Bld: 121 mg/dL — ABNORMAL HIGH (ref 65–99)
POTASSIUM: 3.3 mmol/L — AB (ref 3.5–5.1)
SODIUM: 140 mmol/L (ref 135–145)

## 2018-03-15 LAB — CBC
HEMATOCRIT: 44.4 % (ref 36.0–46.0)
Hemoglobin: 15.1 g/dL — ABNORMAL HIGH (ref 12.0–15.0)
MCH: 31.4 pg (ref 26.0–34.0)
MCHC: 34 g/dL (ref 30.0–36.0)
MCV: 92.3 fL (ref 78.0–100.0)
PLATELETS: 293 10*3/uL (ref 150–400)
RBC: 4.81 MIL/uL (ref 3.87–5.11)
RDW: 13.2 % (ref 11.5–15.5)
WBC: 7.9 10*3/uL (ref 4.0–10.5)

## 2018-03-15 LAB — I-STAT TROPONIN, ED: Troponin i, poc: 0 ng/mL (ref 0.00–0.08)

## 2018-03-15 MED ORDER — HYDROCODONE-ACETAMINOPHEN 5-325 MG PO TABS
1.0000 | ORAL_TABLET | Freq: Once | ORAL | Status: AC
  Administered 2018-03-15: 1 via ORAL
  Filled 2018-03-15: qty 1

## 2018-03-15 MED ORDER — HYDROCODONE-ACETAMINOPHEN 5-325 MG PO TABS: 1.0000 | ORAL_TABLET | ORAL | 0 refills | Status: DC | PRN

## 2018-03-15 MED ORDER — TRAMADOL HCL 50 MG PO TABS: 50.0000 mg | ORAL_TABLET | Freq: Four times a day (QID) | ORAL | 0 refills | Status: DC | PRN

## 2018-03-15 MED ORDER — HYDROCODONE-ACETAMINOPHEN 5-325 MG PO TABS: 1.0000 | ORAL_TABLET | Freq: Once | ORAL | Status: DC

## 2018-03-15 MED ORDER — METHOCARBAMOL 500 MG PO TABS: 500.0000 mg | ORAL_TABLET | Freq: Two times a day (BID) | ORAL | 0 refills | Status: DC

## 2018-03-15 NOTE — ED Notes (Signed)
Pt returns from vascular.  

## 2018-03-15 NOTE — ED Provider Notes (Signed)
Patient taken in sign out from PA upstill. Patient with neck pain history of left upper extremity DVT.  Her DVT study is negative.  Patient states that she thinks she may have a muscle strain injury from walking with weights yesterday.  Will discharge with pain medications and muscle relaxer.  Patient ready for discharge at this time.  Discussed all findings.  Addressed concerns with the patient.  She appears appropriate for discharge   Margarita Mail, Hershal Coria 03/15/18 1511    Quintella Reichert, MD 03/19/18 1149

## 2018-03-15 NOTE — ED Notes (Signed)
Pt placed on cardiac monitor. Pattern is NSR with no ectopy noted. Pt to vascular via bed.

## 2018-03-15 NOTE — ED Provider Notes (Signed)
Patient seen/examined in the Emergency Department in conjunction with Midlevel Provider [place] Patient reports left neck pain, denies chest pain or shortness of breath.  No new arm  weakness Exam : Awake alert, appears anxious, diffuse tenderness to left neck without any edema or erythema. Plan: Patient recently missed doses of her anticoagulants and she has not had known history of DVT in left subclavian and brachiocephalic and jugular veins.  She will need to have ultrasound to evaluate for this.  Otherwise she is awake alert, neurologically intact.    Ripley Fraise, MD 03/15/18 2054

## 2018-03-15 NOTE — ED Triage Notes (Addendum)
Pt has been having L sided neck pain worse tonight that radiates into chest. Hx of blood clot in L side of neck, has been on blood thinners for the past year. Has taken a muscle relaxer and aleve for pain

## 2018-03-15 NOTE — ED Notes (Signed)
Patient ambulated to bathroom; patient states pain meds given last night really helped; pt states pain at 4/10 currently and she feels much better;-Monique,RN

## 2018-03-15 NOTE — Progress Notes (Signed)
*  Preliminary Results* Left upper extremity venous duplex completed. Left upper extremity is negative for deep and superficial vein thrombosis.  03/15/2018 8:57 AM  Maudry Mayhew, BS, RVT, RDCS, RDMS

## 2018-03-15 NOTE — ED Provider Notes (Signed)
[facility] EMERGENCY DEPARTMENT Provider Note   CSN: [id number] Arrival date & time: 03/15/18  0112     History   Chief Complaint Chief Complaint  Patient presents with  . Neck Pain    HPI Jessica Vega is a 57 y.o. female.  Patient with a history of AML, anemia, HTN, left neck and UE DVT on Xarelto, presents with left neck and UE pain since last night. No injury. She reports feeling a tingling sensation to the left skull base through the day yesterday prior to the onset of pain last night. No swelling. She states it feels like when she had a blood clot in the same area last year with the exception that there was significant swelling previously that is absent now. No weakness of the extremity. No numbness. She denies SOB, CP, N, V. She states that she missed 4-5 days of her Xarelto last week while she was waiting for a refill mail prescription to be filled and has been back on the medication for the past 5 days.  The history is provided by the patient. No language interpreter was used.  Neck Pain   Pertinent negatives include no chest pain, no numbness, no headaches and no weakness.    Past Medical History:  Diagnosis Date  . AML (acute myeloblastic leukemia) ([place])    s/p bone marrow transplant in 7253 complicated by CMV, colitis, and sepsis  . Anemia   . Hypertension     Patient Active Problem List   Diagnosis Date Noted  . 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 Bronchiectasis ([place]) 12/09/2017  . DVT (deep venous thrombosis) ([place]) 04/04/2017  . Acute deep vein thrombosis (DVT) of brachial vein of left upper extremity ([place]) 04/03/2017  . Tobacco use disorder 04/03/2017  . Endobronchial mass 04/03/2017  . Chest pain 01/16/2017  . Dyspnea 01/16/2017  . Acute myeloid leukemia in remission ([place]) 01/16/2017  . History of pericarditis 01/16/2017  . HTN (hypertension) 01/16/2017  . Atrial flutter ([place])   . Other pancytopenia ([place])  04/30/2013    Past Surgical History:  Procedure Laterality Date  . ABDOMINAL HYSTERECTOMY    . CHOLECYSTECTOMY       OB History   None      Home Medications    Prior to Admission medications   Medication Sig Start Date End Date Taking? Authorizing Provider  amLODipine (NORVASC) 5 MG tablet Take 5 mg by mouth daily.   Yes [provider]  amoxicillin (AMOXIL) 500 MG capsule Take 500 mg by mouth 4 (four) times daily.   Yes [provider]  cholecalciferol (VITAMIN D) 1000 UNITS tablet Take 1,000 Units by mouth daily after breakfast.    Yes [provider]  fluticasone (FLONASE) 50 MCG/ACT nasal spray Place 1 spray into both nostrils daily.   Yes [provider]  ketotifen (ZADITOR) 0.025 % ophthalmic solution Place 1 drop into both eyes 2 (two) times daily.   Yes [provider]  Magnesium Oxide 420 (252 Mg) MG TABS Take 420 mg by mouth daily.   Yes [provider]  omeprazole (PRILOSEC) 20 MG capsule Take 20 mg by mouth at bedtime.  08/31/15  Yes [provider]  potassium chloride (K-DUR) 10 MEQ tablet Take 10 mEq by mouth 3 (three) times daily.   Yes [provider]  rivaroxaban (XARELTO) 20 MG TABS tablet Take 20 mg by mouth daily with supper.   Yes [provider]  Albuterol Sulfate (PROAIR  RESPICLICK) 161 (90 Base) MCG/ACT AEPB Inhale 2 puffs into the lungs every 4 (four) hours as needed. Patient not taking: Reported on 03/15/2018 09/27/16   Robyn Haber, MD  benzonatate (TESSALON) 200 MG capsule Take 1 capsule (200 mg total) by mouth 3 (three) times daily as needed for cough. Patient not taking: Reported on 03/15/2018 12/11/17   Bonnielee Haff, MD  cyclobenzaprine (FLEXERIL) 10 MG tablet Take 1 tablet (10 mg total) by mouth daily as needed for muscle spasms. Patient not taking: Reported on 03/15/2018 04/04/17   Nita Sells, MD  nicotine (NICODERM CQ - DOSED IN MG/24 HOURS) 14 mg/24hr patch  Place 1 patch (14 mg total) onto the skin daily. Patient not taking: Reported on 01/09/2018 12/12/17   Bonnielee Haff, MD  Rivaroxaban 15 & 20 MG TBPK Take as directed on package: Start with one 15mg  tablet by mouth twice a day with food. On Day 22, switch to one 20mg  tablet once a day with food. Patient not taking: Reported on 03/15/2018 04/04/17   Nita Sells, MD    Family History Family History  Problem Relation Age of Onset  . CVA Mother   . Diabetes Mellitus II Father   . Renal Disease Father     Social History Social History   Tobacco Use  . Smoking status: Former Smoker    Packs/day: 1.00    Types: Cigarettes  . Smokeless tobacco: Never Used  . Tobacco comment: quit 11/29/17  Substance Use Topics  . Alcohol use: Yes    Alcohol/week: 0.6 oz    Types: 1 Glasses of wine per week    Comment: rarely  . Drug use: No     Allergies   Chlorhexidine; Other; and Latex   Review of Systems Review of Systems  Constitutional: Negative for chills and fever.  Respiratory: Negative.  Negative for shortness of breath.   Cardiovascular: Negative.  Negative for chest pain.  Gastrointestinal: Negative.  Negative for abdominal pain and nausea.  Musculoskeletal: Positive for neck pain.  Skin: Negative.  Negative for color change.  Neurological: Negative.  Negative for weakness, numbness and headaches.     Physical Exam Updated Vital Signs BP 132/88   Pulse 72   Temp 97.8 F (36.6 C)   Resp 16   SpO2 99%   Physical Exam  Constitutional: She is oriented to person, place, and time. She appears well-developed and well-nourished.  Neck: Normal range of motion.  Cardiovascular: Intact distal pulses.  Pulmonary/Chest: Effort normal.  Musculoskeletal:  No neck swelling. There is tenderness of left lateral neck without mass, redness or warmth. Tenderness extends to the left axilla. FROM UE's bilaterally with no deficits of strength.   Neurological: She is alert and oriented  to person, place, and time. No sensory deficit.  Skin: Skin is warm and dry. No erythema.  Psychiatric: She has a normal mood and affect.     ED Treatments / Results  Labs (all labs ordered are listed, but only abnormal results are displayed) Labs Reviewed  BASIC METABOLIC PANEL - Abnormal; Notable for the following components:      Result Value   Potassium 3.3 (*)    Glucose, Bld 121 (*)    Creatinine, Ser 1.05 (*)    Calcium 8.7 (*)    GFR calc non Af Amer 58 (*)    All other components within normal limits  CBC - Abnormal; Notable for the following components:   Hemoglobin 15.1 (*)    All other components within  normal limits  I-STAT TROPONIN, ED    EKG EKG Interpretation  Date/Time:  Thursday March 15 2018 01:20:57 EDT Ventricular Rate:  70 PR Interval:  150 QRS Duration: 90 QT Interval:  422 QTC Calculation: 455 R Axis:   -23 Text Interpretation:  Sinus rhythm with Fusion complexes Otherwise normal ECG Interpretation limited secondary to artifact Confirmed by Ripley Fraise [phone]) on 03/15/2018 3:09:23 AM   Radiology No results found.  Procedures Procedures (including critical care time)  Medications Ordered in ED Medications  HYDROcodone-acetaminophen (NORCO/VICODIN) 5-325 MG per tablet 1 tablet (has no administration in time range)     Initial Impression / Assessment and Plan / ED Course  I have reviewed the triage vital signs and the nursing notes.  Pertinent labs & imaging results that were available during my care of the patient were reviewed by me and considered in my medical decision making (see chart for details).     Patient presents for evaluation of left lateral neck and left UE pain similar to previous diagnosis of subclavian, brachiocephalic and jugular DVT. No swelling tonight like before. She missed several days of her Xarelto last week putting her at risk for recurrence. She will need venous duplex studies for further evaluation.    Discussed with pharmacy regarding dosing schedule of Xarelto given 5 missed days who confirms that starting back on 20 mg daily dose as she has done is appropriate without having to have the initial 15 mg BID dosing schedule.   Patient prefers waiting here until morning to have the doppler done rather than returning in the morning. She will board in the ED until study is completed and disposition will be determined by oncoming provider after review of the results.  Final Clinical Impressions(s) / ED Diagnoses   Final diagnoses:  None   1. Left neck pain 2. History of neck and left UE DVT 3. Medication noncompliance, Xarelto  ED Discharge Orders    None       Charlann Lange, PA-C 03/15/18 7591    Ripley Fraise, MD 03/15/18 [phone]

## 2018-03-15 NOTE — ED Notes (Signed)
Pt reports left sided neck pain that began around 2100 last night. Pt reports she had some irritation on the left side of her neck all day yesterday and began having pain at 2100. Pt reports a hx of a blood clot to the left side of her neck.

## 2018-03-15 NOTE — Discharge Instructions (Addendum)
Contact a health care provider if: You have increasing pain or swelling in the injured area. You have numbness, tingling, or a significant loss of strength in the injured area.

## 2018-04-10 ENCOUNTER — Telehealth: Payer: Self-pay | Admitting: *Deleted

## 2018-04-10 DIAGNOSIS — A429 Actinomycosis, unspecified: Secondary | ICD-10-CM

## 2018-04-10 NOTE — Telephone Encounter (Signed)
Yes can you please send in a refill x 2 months - I plan on having her continue this for her longer. Thank you!

## 2018-04-10 NOTE — Telephone Encounter (Signed)
Patient has 2 days left of antibiotics (completion of 3 months oral antibiotics), but is not scheduled until 7/22.  Should she continue oral antibiotics until her appointment? Please advise. Landis Gandy, RN

## 2018-04-11 MED ORDER — AMOXICILLIN 500 MG PO CAPS: 500.0000 mg | ORAL_CAPSULE | Freq: Four times a day (QID) | ORAL | 1 refills | Status: DC

## 2018-04-11 NOTE — Telephone Encounter (Signed)
Refill sent to pharmacy of patient's choice. Confirmed upcoming appointment.

## 2018-04-11 NOTE — Addendum Note (Signed)
Addended by: Landis Gandy on: 04/11/2018 03:54 PM   Modules accepted: Orders

## 2018-04-20 DIAGNOSIS — Z1231 Encounter for screening mammogram for malignant neoplasm of breast: Secondary | ICD-10-CM | POA: Diagnosis not present

## 2018-04-20 DIAGNOSIS — Z803 Family history of malignant neoplasm of breast: Secondary | ICD-10-CM | POA: Diagnosis not present

## 2018-04-23 ENCOUNTER — Encounter: Payer: Self-pay | Admitting: Infectious Diseases

## 2018-04-23 ENCOUNTER — Ambulatory Visit (INDEPENDENT_AMBULATORY_CARE_PROVIDER_SITE_OTHER): Payer: Medicare Other | Admitting: Infectious Diseases

## 2018-04-23 ENCOUNTER — Telehealth: Payer: Self-pay

## 2018-04-23 VITALS — BP 127/89 | HR 94 | Temp 97.8°F | Wt 179.0 lb

## 2018-04-23 DIAGNOSIS — A429 Actinomycosis, unspecified: Secondary | ICD-10-CM

## 2018-04-23 DIAGNOSIS — J470 Bronchiectasis with acute lower respiratory infection: Secondary | ICD-10-CM

## 2018-04-23 DIAGNOSIS — F17200 Nicotine dependence, unspecified, uncomplicated: Secondary | ICD-10-CM | POA: Diagnosis not present

## 2018-04-23 MED ORDER — AMOXICILLIN 500 MG PO CAPS: 500.0000 mg | ORAL_CAPSULE | Freq: Three times a day (TID) | ORAL | 3 refills | Status: DC

## 2018-04-23 NOTE — Telephone Encounter (Signed)
PT called today stating discharge orders had to be faxed to [facility] for oxygen to be picked up from pt's house. PT was able to confirm that [facility] would be picking up the oxygen once orders have been sent to them. Informed CMA working with Janene Madeira, NP that orders would have to be faxed to [facility]. CMA will let me know once orders are ready to be faxed. [place]

## 2018-04-23 NOTE — Progress Notes (Signed)
Patient: Jessica Vega  DOB: 05-30-1961 MRN: [id number] PCP: Tennis Must, MD  Pulmonologist:  Referring Provider: Hospital visit   Chief Complaint  Patient presents with  . Follow-up    actinomyces pulmonary infection    Patient Active Problem List   Diagnosis Date Noted  . 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 Bronchiectasis ([place]) 12/09/2017  . DVT (deep venous thrombosis) ([place]) 04/04/2017  . Acute deep vein thrombosis (DVT) of brachial vein of left upper extremity ([place]) 04/03/2017  . Tobacco use disorder 04/03/2017  . Endobronchial mass 04/03/2017  . Acute myeloid leukemia in remission ([place]) 01/16/2017  . History of pericarditis 01/16/2017  . HTN (hypertension) 01/16/2017  . Atrial flutter ([place])   . Other pancytopenia ([place]) 04/30/2013     Brief Narrative:   [name] is a 57 y.o. 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 Has been working with [facility] since 07/2017 for tx of newly diagnosed bronchiectasis, worsening dyspnea on exertion/at rest and persistent pulmonary intraluminal mass. She has had numerous bronchoscopies over the years per chart review yielding pseudomonas, moraxella catarrhalis, candida glabrata (07/2014 - fungemic during presentation and tx with 4x IV antifungals then secondary prophylaxis), penicillium, saprophytic fungus. Most recently bronchoscopy was performed in November 2018 - cultures were positive for pseudomonas only and she was treated with course of Levaquin. Hospitalization at [facility] in early March 2019 with hemoptysis and pneumonia. Treated with Levaquin. Blood cultures positive for actinomyces odontolyticus at that time. Possibility that what has been presumed to be fungal  myectoma may actually be pulmonary actinomyces infection.   Subjective:   Chief Complaint  Patient presents with  . Follow-up   HPI:  [name] is here for follow up on her actinomyces blood stream infection and suspected pulmonary involvement. She reports good adherence with QID amoxicillin since our last office visit. She has had 56reported changes to her health history since then either. She had quit smoking for 117 days but 10-days ago she picked up smoking again. The biggest reason is that she has a hard time saying "no" to people who have asked her for more help than she really feels she can offer. She struggles with PTSD from [employer] work and she is a people pleaser to which she suffers from in the end. She is in counseling for this and will continue to work on it to continue abstaining from cigarettes. She has had no need for home oxygen therapy for several months and really feels that her antibiotic is helping things. She has good tolerance of ADLs and never finds herself to be breathless before as compared with the few times she was in the hospital.   Follow up with lung doctor in September.   Review of Systems  Constitutional: Negative for chills, diaphoresis, fever, malaise/fatigue and weight loss.  HENT: Negative for sore throat.        Swelling to left neck d/t jugular DVT   Eyes: Negative for blurred vision.  Respiratory: Positive for cough and sputum production (clear sputum). Negative for hemoptysis, shortness of breath and wheezing.   Cardiovascular: Negative for chest pain, orthopnea and leg swelling.  Gastrointestinal: Negative for abdominal pain, diarrhea, nausea and vomiting.  Genitourinary: Negative for dysuria and frequency.  Musculoskeletal: Negative for myalgias.  Skin: Negative for  rash.  Neurological: Negative for dizziness and headaches.  Psychiatric/Behavioral: The patient is nervous/anxious.     Past Medical History:  Diagnosis Date  . AML (acute  myeloblastic leukemia) ([place])    s/p bone marrow transplant in 2297 complicated by CMV, colitis, and sepsis  . Anemia   . Hypertension     Outpatient Medications Prior to Visit  Medication Sig Dispense Refill  . Albuterol Sulfate (PROAIR RESPICLICK) 989 (90 Base) MCG/ACT AEPB Inhale 2 puffs into the lungs every 4 (four) hours as needed. 1 each 2  . amLODipine (NORVASC) 5 MG tablet Take 5 mg by mouth daily.    . benzonatate (TESSALON) 200 MG capsule Take 1 capsule (200 mg total) by mouth 3 (three) times daily as needed for cough. 30 capsule 0  . cholecalciferol (VITAMIN D) 1000 UNITS tablet Take 1,000 Units by mouth daily after breakfast.     . cyclobenzaprine (FLEXERIL) 10 MG tablet Take 1 tablet (10 mg total) by mouth daily as needed for muscle spasms. 30 tablet 0  . fluticasone (FLONASE) 50 MCG/ACT nasal spray Place 1 spray into both nostrils daily.    Marland Kitchen HYDROcodone-acetaminophen (NORCO) 5-325 MG tablet Take 1 tablet by mouth every 4 (four) hours as needed for severe pain. 6 tablet 0  . ketotifen (ZADITOR) 0.025 % ophthalmic solution Place 1 drop into both eyes 2 (two) times daily.    . Magnesium Oxide 420 (252 Mg) MG TABS Take 420 mg by mouth daily.    . methocarbamol (ROBAXIN) 500 MG tablet Take 1 tablet (500 mg total) by mouth 2 (two) times daily. 20 tablet 0  . nicotine (NICODERM CQ - DOSED IN MG/24 HOURS) 14 mg/24hr patch Place 1 patch (14 mg total) onto the skin daily. 30 patch 0  . omeprazole (PRILOSEC) 20 MG capsule Take 20 mg by mouth at bedtime.     . potassium chloride (K-DUR) 10 MEQ tablet Take 10 mEq by mouth 3 (three) times daily.    . rivaroxaban (XARELTO) 20 MG TABS tablet Take 20 mg by mouth daily with supper.    . Rivaroxaban 15 & 20 MG TBPK Take as directed on package: Start with one 15mg  tablet by mouth twice a day with food. On Day 22, switch to one 20mg  tablet once a day with food. 51 each 0  . amoxicillin (AMOXIL) 500 MG capsule Take 1 capsule (500 mg total) by mouth 4  (four) times daily. 120 capsule 1   No facility-administered medications prior to visit.      Allergies  Allergen Reactions  . Chlorhexidine Rash  . Other Itching    Transparent dressing causes itching per pt  . Latex Rash    Social History   Tobacco Use  . Smoking status: Former Smoker    Packs/day: 1.00    Types: Cigarettes  . Smokeless tobacco: Never Used  Substance Use Topics  . Alcohol use: Yes    Alcohol/week: 0.6 oz    Types: 1 Glasses of wine per week    Comment: rarely  . Drug use: No    Family History  Problem Relation Age of Onset  . CVA Mother   . Diabetes Mellitus II Father   . Renal Disease Father     Objective:   Vitals:   04/23/18 1021  BP: 127/89  Pulse: 94  Temp: 97.8 F (36.6 C)  TempSrc: Oral  Weight: 179 lb (81.2 kg)   Body mass index is 31.96 kg/m.  Physical Exam  Constitutional:  She is oriented to person, place, and time and well-developed, well-nourished, and in no distress.  Very pleasant AA female seated comfortably in chair. Accompanied by her mother.   HENT:  Mouth/Throat: Oropharynx is clear and moist. No oral lesions. Normal dentition. No dental abscesses.  Eyes: Pupils are equal, round, and reactive to light. No scleral icterus.  Neck: No tracheal deviation present.    Cardiovascular: Normal rate, regular rhythm, S1 normal, S2 normal, normal heart sounds and intact distal pulses. Exam reveals no friction rub.  No murmur heard. Pulmonary/Chest: No tachypnea. No respiratory distress. She has no decreased breath sounds. She has no wheezes. She has no rhonchi. She has no rales.  No conversational dyspnea.   Abdominal: Soft. Normal appearance.  Musculoskeletal: Normal range of motion. She exhibits no edema.  Lymphadenopathy:    She has no cervical adenopathy.  Neurological: She is alert and oriented to person, place, and time. No cranial nerve deficit.  Skin: Skin is warm and dry.  Psychiatric: Memory and affect normal.     Lab Results: Lab Results  Component Value Date   WBC 7.9 03/15/2018   HGB 15.1 (H) 03/15/2018   HCT 44.4 03/15/2018   MCV 92.3 03/15/2018   PLT 293 03/15/2018    Lab Results  Component Value Date   CREATININE 1.05 (H) 03/15/2018   BUN 11 03/15/2018   NA 140 03/15/2018   K 3.3 (L) 03/15/2018   CL 101 03/15/2018   CO2 29 03/15/2018    Lab Results  Component Value Date   ALT 21 04/03/2017   AST 23 04/03/2017   ALKPHOS 68 04/03/2017   BILITOT 0.9 04/03/2017    IMAGING REVIEW: 06/17/13: Invasive fungal pneumonia by chest CT findings, initially treated with Vfend (started 06/18/13), but transitioned to posaconazole (06/23/13) secondary to worsening CT findings  2016 CT scan: A soft tissue density is noted in the bronchiectatic segment to the middle lobe. This may arise within the bronchus or represent external compression/invasion. This is centrally located, and may be amenable to bronchoscopic evaluation. Bronchiectatic changes and scarring of RML.   04/2017 Chest CT: Redemonstration of a right upper lobe endobronchial mass measuring 2.2 cm with associated surrounding bronchiectasis and parenchymal architectural distortion. This lesion has been minimally changed relative to CT imaging of the chest dating back to 2015, and it likely relates to chronic fungal infection.  04/2017 Head/Neck CT: Deep venous thrombosis in the left subclavian, brachiocephalic, and lower jugular veins  08/2017 Lung Biopsy and bronchial washings - respiratory mucosa and squamous epithelium with chronic and focal acute inflammation; fragment of tissue with fungal hyphae present on routine H&E section but fungal stains (GMS/PAS) were negative for fungal organisms.  BAL aspergillosis galactomanan - negative   Previous serum aspergillosis galactomanan's also negative.   Pseudomonas >100k colonies - pansensitive   Second strain of Pseudomonas growing 50k colonies - Intermediate to aztreonam  10/10/2017 -  respiratory samples negative for AFB  Assessment & Plan:   Problem List Items Addressed This Visit      Respiratory   Bronchiectasis ([place])    No flares of pneumonia since starting therapy on amoxicillin and stopping smoking. Evaluation on upcoming CT scan.         Other   Tobacco use disorder    Set back and started smoking lightly again. Discussed that this is a process and not to let set backs deter her from trying again. I asked her to please reconsider working with her therapist and work  on stress reduction.       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 She has had improvement in her cough and has very little if any sputum throughout the day. She is able to perform all ADLs for herself and has been trying to walk more and more. She does not use her oxygen and has not in some time now. Will ask [facility] to please remove this from her home.   Reduce amoxicillin to TID to assist with pill burden. Return to clinic in 72m with planned CT of the chest in September if pulmonology team agrees.       Relevant Medications   amoxicillin (AMOXIL) 500 MG capsule     Return in about 3 months (around 07/24/2018).  Janene Madeira, MSN, NP-C [facility] for Infectious [place] Pager: [phone] Office: [phone]  04/27/18

## 2018-04-23 NOTE — Patient Instructions (Addendum)
Please call us with the manufacturer of your oxygen tank at home - we can send office notes to discontinue this for you.    Will reach out to your pulmonologist to discuss repeating your CT scan around September.   I would like for you to continue your amoxicillin but you can reduce it to 3 times a day instead of 4 times a day now.   Will see you back in October to check in.   Wonderful to see you!   Continue to work on your smoking as you are - set backs are NOT failures :)

## 2018-04-27 NOTE — Assessment & Plan Note (Signed)
Jessica Vega has now completed 4 months of therapy for actinomycosis with presumed pulmonary involvement now. She has had improvement in her cough and has very little if any sputum throughout the day. She is able to perform all ADLs for herself and has been trying to walk more and more. She does not use her oxygen and has not in some time now. Will ask [facility] to please remove this from her home.   Reduce amoxicillin to TID to assist with pill burden. Return to clinic in 76m with planned CT of the chest in September if pulmonology team agrees.

## 2018-04-27 NOTE — Assessment & Plan Note (Signed)
Set back and started smoking lightly again. Discussed that this is a process and not to let set backs deter her from trying again. I asked her to please reconsider working with her therapist and work on stress reduction.

## 2018-04-27 NOTE — Assessment & Plan Note (Signed)
No flares of pneumonia since starting therapy on amoxicillin and stopping smoking. Evaluation on upcoming CT scan.

## 2018-05-01 NOTE — Progress Notes (Signed)
Thank you greatly!!!

## 2018-06-12 DIAGNOSIS — J984 Other disorders of lung: Secondary | ICD-10-CM | POA: Diagnosis not present

## 2018-06-12 DIAGNOSIS — A498 Other bacterial infections of unspecified site: Secondary | ICD-10-CM | POA: Diagnosis not present

## 2018-06-12 DIAGNOSIS — F17200 Nicotine dependence, unspecified, uncomplicated: Secondary | ICD-10-CM | POA: Diagnosis not present

## 2018-06-12 DIAGNOSIS — J479 Bronchiectasis, uncomplicated: Secondary | ICD-10-CM | POA: Diagnosis not present

## 2018-06-12 DIAGNOSIS — Z23 Encounter for immunization: Secondary | ICD-10-CM | POA: Diagnosis not present

## 2018-07-08 IMAGING — CT CT ANGIO CHEST
2 of 8 series · 18 of 46 positions shown · IV contrast (APPLIED)
Comparison: Chest two-view 04/30/2013

CLINICAL DATA: Pleuritic chest pain and short of breath. History of
AML and bone marrow transplant

EXAM:
CT ANGIOGRAPHY CHEST WITH CONTRAST
TECHNIQUE: Multidetector CT imaging of the chest was performed using the
standard protocol during bolus administration of intravenous
contrast. Multiplanar CT image reconstructions and MIPs were
obtained to evaluate the vascular anatomy.
CONTRAST:  60 mL Isovue 370 IV

[Series 6: thins · axial · 0.65mm/px · z∈[+1102,+1308]mm · 15 of 228 slices shown]
[im 11/228  lung]
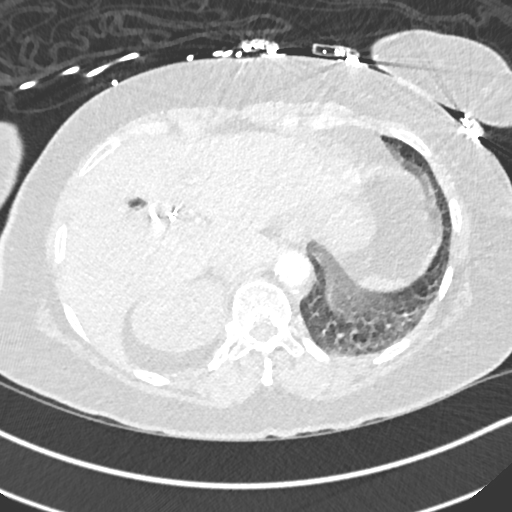
[im 31/228  soft-tissue]
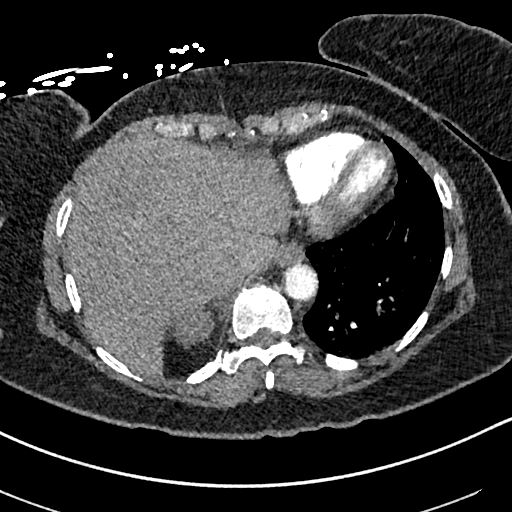
[im 42/228  lung]
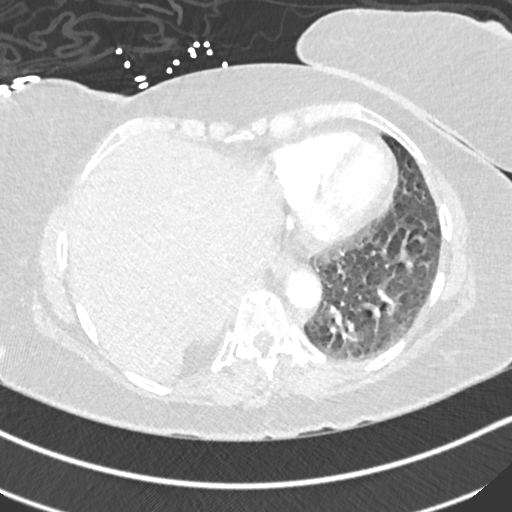
[im 52/228  soft-tissue]
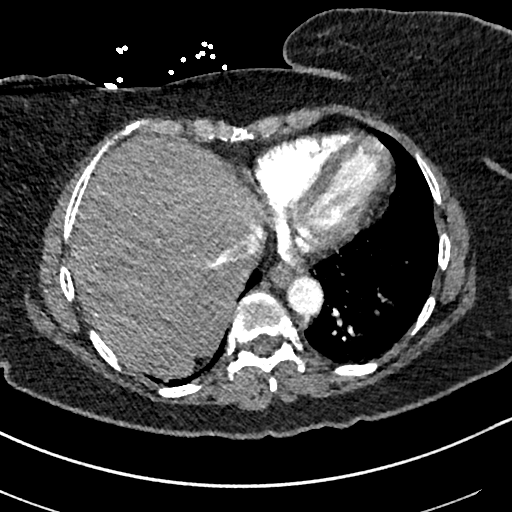
[im 73/228  lung]
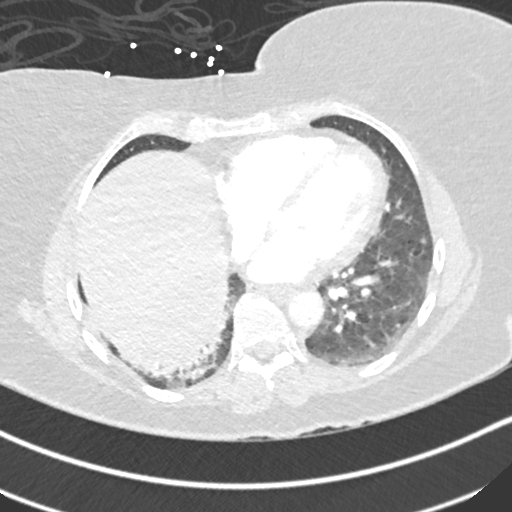
[im 83/228  soft-tissue]
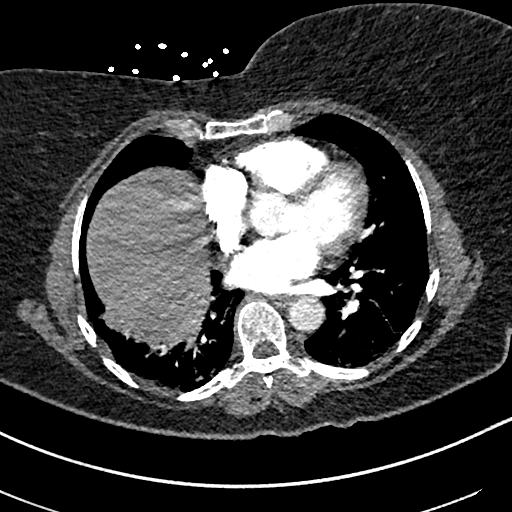
[im 104/228  lung]
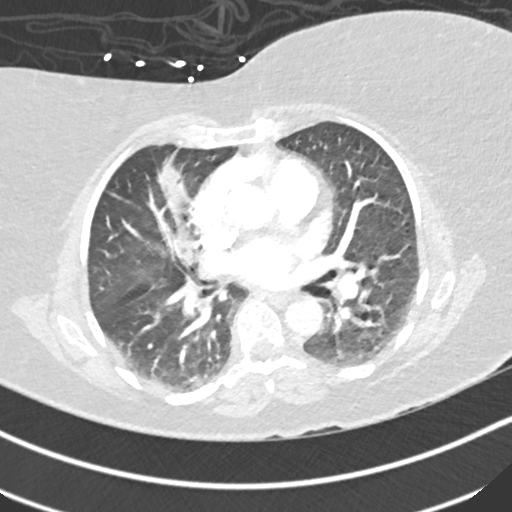
[im 114/228  soft-tissue]
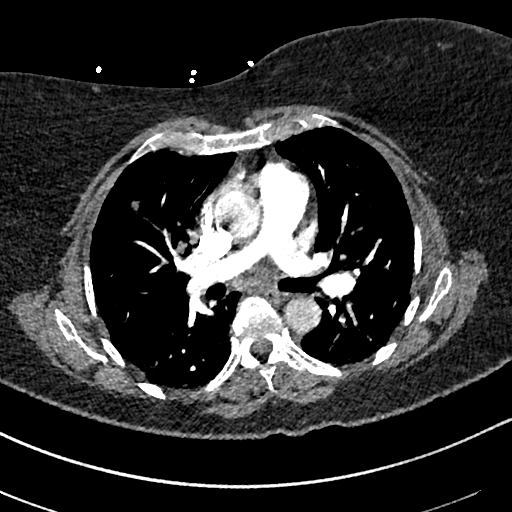
[im 124/228  lung]
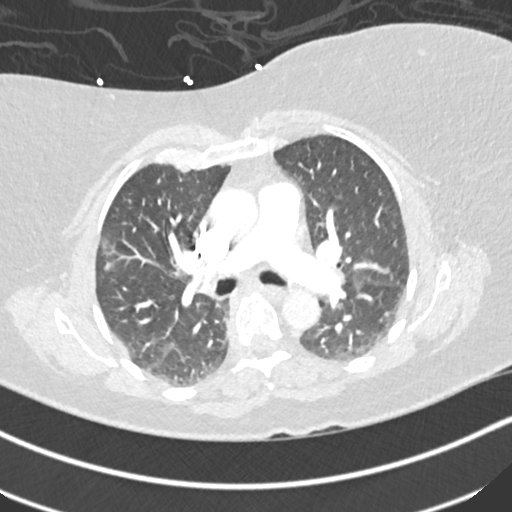
[im 145/228  soft-tissue]
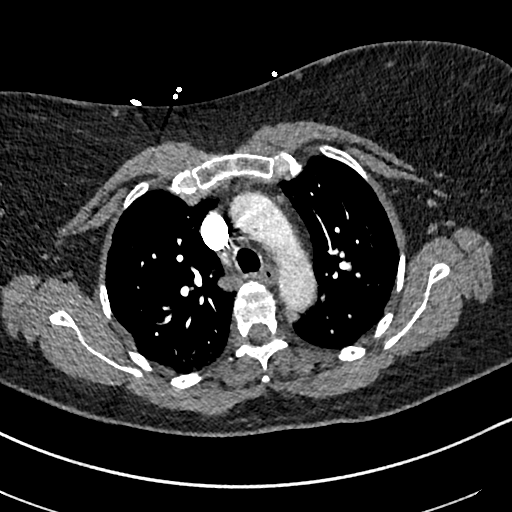
[im 155/228  lung]
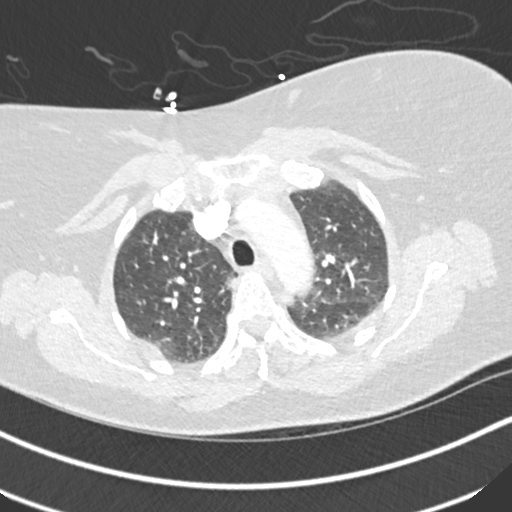
[im 176/228  soft-tissue]
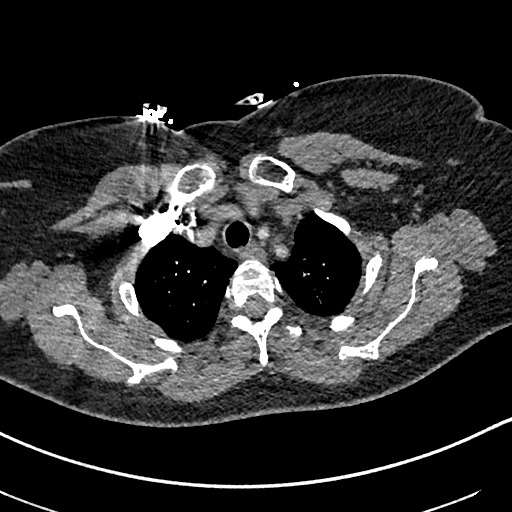
[im 186/228  lung]
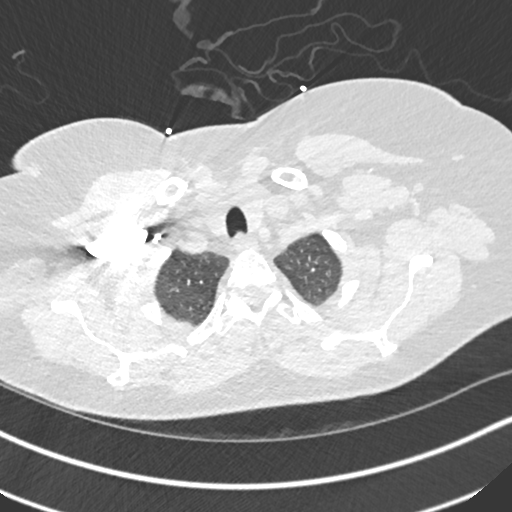
[im 197/228  soft-tissue]
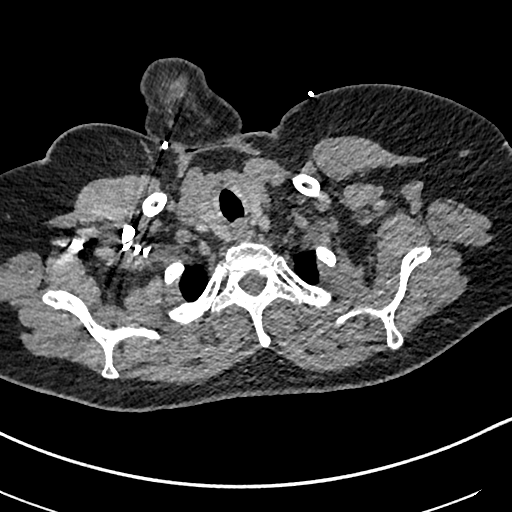
[im 217/228  lung]
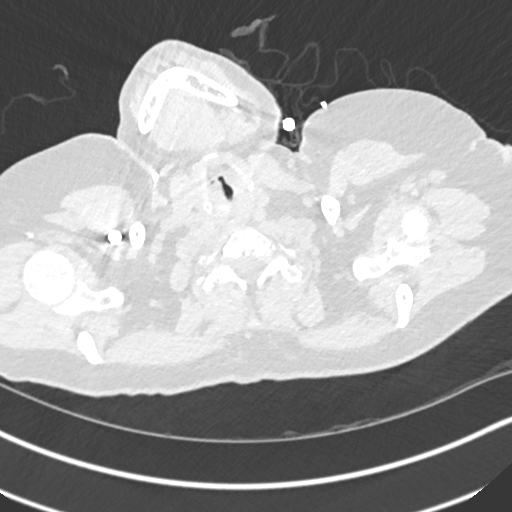

[Series 8: coronal mpr · coronal · 0.48mm/px · 3 of 148 slices shown]
[im 37/148  soft-tissue]
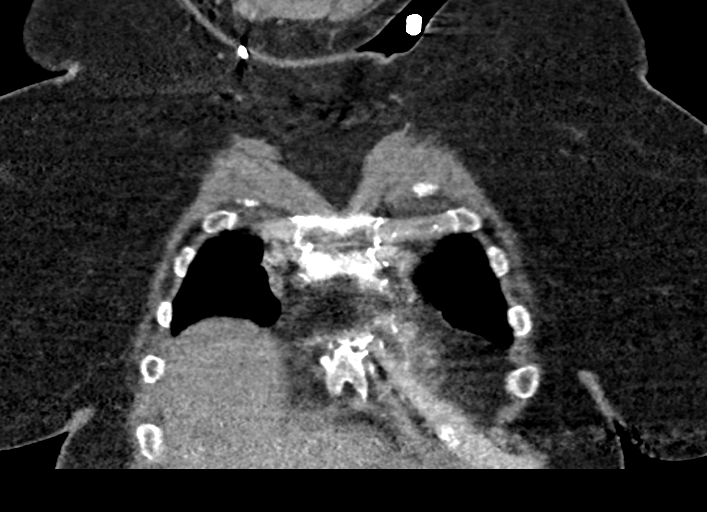
[im 74/148  soft-tissue]
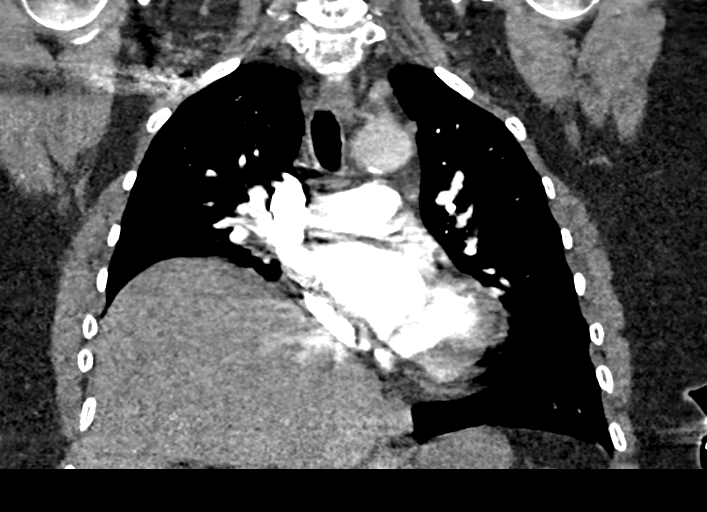
[im 111/148  soft-tissue]
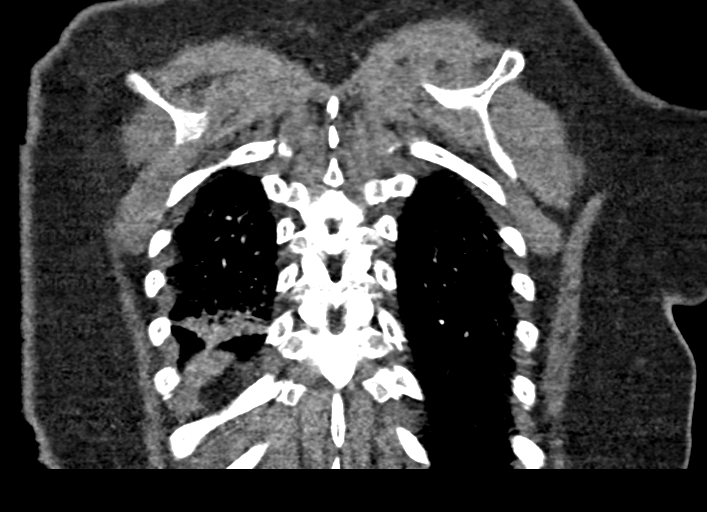

[18 of 46 positions shown; findings below may reference images not displayed]

FINDINGS: Cardiovascular: Image quality degraded by patient most the motion.
This degree of motion could obscure small pulmonary emboli

Negative for pulmonary emboli. Pulmonary arteries normal in caliber.
Negative for aortic aneurysm. Heart size within normal limits.
Negative for pericardial effusion.

Mediastinum/Nodes: Negative

Lungs/Pleura: Right middle lobe atelectasis. Right lower lobe
atelectasis. Negative for pneumonia or effusion. Negative for mass
lesion.

Upper Abdomen: Cholecystectomy. No acute abnormality in the upper
abdomen.

Musculoskeletal: Multiple Mild compression fractures in the thoracic
spine most likely benign and chronic.

Review of the MIP images confirms the above findings.
IMPRESSION: Negative for pulmonary emboli.  No acute abnormality in the chest

Right middle lobe and right lower lobe atelectasis.

Image quality degraded by motion.

## 2018-07-24 ENCOUNTER — Encounter: Payer: Self-pay | Admitting: Infectious Diseases

## 2018-07-24 ENCOUNTER — Ambulatory Visit (INDEPENDENT_AMBULATORY_CARE_PROVIDER_SITE_OTHER): Payer: Medicare Other | Admitting: Infectious Diseases

## 2018-07-24 VITALS — BP 120/80 | HR 81 | Temp 98.3°F | Wt 171.0 lb

## 2018-07-24 DIAGNOSIS — A429 Actinomycosis, unspecified: Secondary | ICD-10-CM

## 2018-07-24 DIAGNOSIS — J470 Bronchiectasis with acute lower respiratory infection: Secondary | ICD-10-CM | POA: Diagnosis not present

## 2018-07-24 DIAGNOSIS — F17200 Nicotine dependence, unspecified, uncomplicated: Secondary | ICD-10-CM

## 2018-07-24 MED ORDER — AMOXICILLIN 500 MG PO CAPS: 500.0000 mg | ORAL_CAPSULE | Freq: Three times a day (TID) | ORAL | 5 refills | Status: DC

## 2018-07-24 NOTE — Patient Instructions (Signed)
Continue your antibiotics as we discussed - will see you back in 6 months. At this time will plan to stop taking them and see how you do. This will give you a good 12 months on treatment which is great.   Please continue to do good work trying to reduce your smoking - especially with cold and flu season coming around we want your lungs to be in good "fighting shape" to protect from infection.

## 2018-07-24 NOTE — Assessment & Plan Note (Signed)
CT scan in July 2018 with [facility] radiology reading endobronchial lesion measuring 22 mm; most recent CT a few weeks ago now with lesion measuring 11 mm [facility] scan). 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 We discussed continuing her treatment for 12 months and then consider stopping therapy. She is very happy and appreciative that she has felt so much better.  Follow up in 65m.

## 2018-07-24 NOTE — Progress Notes (Signed)
Patient: Jessica Vega  DOB: 01/17/1961 MRN: [id number] PCP: Tennis Must, MD  Pulmonologist:  Referring Provider: Hospital visit   Patient Active Problem List   Diagnosis Date Noted  . 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 Bronchiectasis ([place]) 12/09/2017  . DVT (deep venous thrombosis) ([place]) 04/04/2017  . Acute deep vein thrombosis (DVT) of brachial vein of left upper extremity ([place]) 04/03/2017  . Tobacco use disorder 04/03/2017  . Endobronchial mass 04/03/2017  . Acute myeloid leukemia in remission ([place]) 01/16/2017  . History of pericarditis 01/16/2017  . HTN (hypertension) 01/16/2017  . Atrial flutter ([place])   . Other pancytopenia ([place]) 04/30/2013     Brief Narrative:  Jessica Vega is a 57 y.o. 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   Has been working with [facility] since 07/2017 for tx of newly diagnosed bronchiectasis, worsening dyspnea on exertion/at rest and persistent pulmonary intraluminal mass. She has had numerous bronchoscopies over the years per chart review yielding pseudomonas, moraxella catarrhalis, candida glabrata (07/2014 - fungemic during presentation and tx with 4x IV antifungals then secondary prophylaxis), penicillium, saprophytic fungus. Her last bronchoscopy was performed in November 2018 - cultures were positive for pseudomonas only and she was treated with course of Levaquin with improvement.   Hospitalization at [facility] in early March 2019 with hemoptysis and pneumonia. Treated with Levaquin. Blood cultures positive for actinomyces odontolyticus at that time - we started treatment with IV penicillin and conversion to oral amoxicillin therapy for what could be pulmonary actinomyces infection.   Subjective:   Chief  Complaint  Patient presents with  . Follow-up    actinomyces infection     HPI:  She has been doing well since our last office visit. She went to the pulmonologist recently and repeated her lung CT scan and reports she was told she had some improvement and has follow up in 1 year. She moved out of her boyfriend's house and now living with her mother which is a good change for her life. She has been doing well on her TID amoxicillin therapy with improvement in loose stools after dose reduction. She unfortunately has been without antibiotics since September 22nd when she lost them. She has also resumed smoking cigarettes and now back up to 1 ppd. She has no trouble with cough, no shortness of breath, can complete ADLs easily.   Review of Systems  Constitutional: Negative for chills, diaphoresis, fever, malaise/fatigue and weight loss.  HENT: Negative for sore throat.        Swelling to left neck d/t jugular DVT   Eyes: Negative for blurred vision.  Respiratory: Negative for cough, hemoptysis, sputum production, shortness of breath and wheezing.   Cardiovascular: Negative for chest pain, orthopnea and leg swelling.  Gastrointestinal: Negative for abdominal pain, diarrhea, nausea and vomiting.  Genitourinary: Negative for dysuria and frequency.  Musculoskeletal: Negative for myalgias.  Skin: Negative for rash.  Neurological: Negative for dizziness and headaches.  Psychiatric/Behavioral: The patient is nervous/anxious.     Past Medical History:  Diagnosis Date  . AML (acute myeloblastic leukemia) ([place])    s/p bone marrow transplant in 9201 complicated by CMV, colitis, and sepsis  . Anemia   . Hypertension     Outpatient Medications Prior to Visit  Medication Sig Dispense Refill  . Albuterol Sulfate (PROAIR RESPICLICK)  108 (90 Base) MCG/ACT AEPB Inhale 2 puffs into the lungs every 4 (four) hours as needed. 1 each 2  . amLODipine (NORVASC) 5 MG tablet Take 5 mg by mouth daily.    .  cholecalciferol (VITAMIN D) 1000 UNITS tablet Take 1,000 Units by mouth daily after breakfast.     . cyclobenzaprine (FLEXERIL) 10 MG tablet Take 1 tablet (10 mg total) by mouth daily as needed for muscle spasms. 30 tablet 0  . fluticasone (FLONASE) 50 MCG/ACT nasal spray Place 1 spray into both nostrils daily.    Marland Kitchen HYDROcodone-acetaminophen (NORCO) 5-325 MG tablet Take 1 tablet by mouth every 4 (four) hours as needed for severe pain. 6 tablet 0  . ketotifen (ZADITOR) 0.025 % ophthalmic solution Place 1 drop into both eyes 2 (two) times daily.    . Magnesium Oxide 420 (252 Mg) MG TABS Take 420 mg by mouth daily.    . methocarbamol (ROBAXIN) 500 MG tablet Take 1 tablet (500 mg total) by mouth 2 (two) times daily. 20 tablet 0  . nicotine (NICODERM CQ - DOSED IN MG/24 HOURS) 14 mg/24hr patch Place 1 patch (14 mg total) onto the skin daily. 30 patch 0  . omeprazole (PRILOSEC) 20 MG capsule Take 20 mg by mouth at bedtime.     . potassium chloride (K-DUR) 10 MEQ tablet Take 10 mEq by mouth 3 (three) times daily.    . rivaroxaban (XARELTO) 20 MG TABS tablet Take 20 mg by mouth daily with supper.    . benzonatate (TESSALON) 200 MG capsule Take 1 capsule (200 mg total) by mouth 3 (three) times daily as needed for cough. (Patient not taking: Reported on 07/24/2018) 30 capsule 0  . Rivaroxaban 15 & 20 MG TBPK Take as directed on package: Start with one 15mg  tablet by mouth twice a day with food. On Day 22, switch to one 20mg  tablet once a day with food. (Patient not taking: Reported on 07/24/2018) 51 each 0  . amoxicillin (AMOXIL) 500 MG capsule Take 1 capsule (500 mg total) by mouth 3 (three) times daily. (Patient not taking: Reported on 07/24/2018) 120 capsule 3   No facility-administered medications prior to visit.      Allergies  Allergen Reactions  . Chlorhexidine Rash  . Other Itching    Transparent dressing causes itching per pt  . Latex Rash    Social History   Tobacco Use  . Smoking  status: Former Smoker    Packs/day: 1.00    Types: Cigarettes  . Smokeless tobacco: Never Used  Substance Use Topics  . Alcohol use: Yes    Alcohol/week: 1.0 standard drinks    Types: 1 Glasses of wine per week    Comment: rarely  . Drug use: No    Family History  Problem Relation Age of Onset  . CVA Mother   . Diabetes Mellitus II Father   . Renal Disease Father     Objective:   Vitals:   07/24/18 1017  BP: 120/80  Pulse: 81  Temp: 98.3 F (36.8 C)  Weight: 171 lb (77.6 kg)   Body mass index is 30.53 kg/m.  Physical Exam  Constitutional: She is oriented to person, place, and time and well-developed, well-nourished, and in no distress.  Very pleasant AA female seated comfortably in chair.  HENT:  Mouth/Throat: Oropharynx is clear and moist. No oral lesions. Normal dentition. No dental abscesses.  Eyes: Pupils are equal, round, and reactive to light. No scleral icterus.  Neck: No  tracheal deviation present.    Cardiovascular: Normal rate, regular rhythm, S1 normal, S2 normal, normal heart sounds and intact distal pulses. Exam reveals no friction rub.  No murmur heard. Pulmonary/Chest: Effort normal. No tachypnea. No respiratory distress. She has no decreased breath sounds. She has no wheezes. She has no rhonchi. She has no rales.  No conversational dyspnea.   Abdominal: Soft. Normal appearance.  Musculoskeletal: Normal range of motion. She exhibits no edema.  Lymphadenopathy:    She has no cervical adenopathy.  Neurological: She is alert and oriented to person, place, and time. No cranial nerve deficit.  Skin: Skin is warm and dry.  Psychiatric: Memory and affect normal.  Vitals reviewed.   Lab Results: Lab Results  Component Value Date   WBC 7.9 03/15/2018   HGB 15.1 (H) 03/15/2018   HCT 44.4 03/15/2018   MCV 92.3 03/15/2018   PLT 293 03/15/2018    Lab Results  Component Value Date   CREATININE 1.05 (H) 03/15/2018   BUN 11 03/15/2018   NA 140  03/15/2018   K 3.3 (L) 03/15/2018   CL 101 03/15/2018   CO2 29 03/15/2018    Lab Results  Component Value Date   ALT 21 04/03/2017   AST 23 04/03/2017   ALKPHOS 68 04/03/2017   BILITOT 0.9 04/03/2017    IMAGING REVIEW: 06/17/13: Invasive fungal pneumonia by chest CT findings, initially treated with Vfend (started 06/18/13), but transitioned to posaconazole (06/23/13) secondary to worsening CT findings  2016 CT scan: A soft tissue density is noted in the bronchiectatic segment to the middle lobe. This may arise within the bronchus or represent external compression/invasion. This is centrally located, and may be amenable to bronchoscopic evaluation. Bronchiectatic changes and scarring of RML.   04/2017 Chest CT: Redemonstration of a right upper lobe endobronchial mass measuring 2.2 cm with associated surrounding bronchiectasis and parenchymal architectural distortion. This lesion has been minimally changed relative to CT imaging of the chest dating back to 2015, and it likely relates to chronic fungal infection.  04/2017 Head/Neck CT: Deep venous thrombosis in the left subclavian, brachiocephalic, and lower jugular veins  08/2017 Lung Biopsy and bronchial washings - respiratory mucosa and squamous epithelium with chronic and focal acute inflammation; fragment of tissue with fungal hyphae present on routine H&E section but fungal stains (GMS/PAS) were negative for fungal organisms.  BAL aspergillosis galactomanan - negative   Previous serum aspergillosis galactomanan's also negative.   Pseudomonas >100k colonies - pansensitive   Second strain of Pseudomonas growing 50k colonies - Intermediate to aztreonam  10/10/2017 - respiratory samples negative for AFB  06/2018 - Chest CT: Mild centrilobular emphysema. No change in RML bronchiectasis with associated architectural distortion and endobronchial lesion measuring 11 mm. RLL micro-nodules that is stable. Unchanged nodular scarring in the RUL.  Improvement in ground glass opacities.   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 We discussed continuing her treatment for 12 months and then consider stopping therapy. She is very happy and appreciative that she has felt so much  better.  Follow up in 62m.       Relevant Medications   amoxicillin (AMOXIL) 500 MG capsule   Bronchiectasis ([place])    She is up to date on flu and pneumonia vaccines. Discussed good hand washing, limiting close contact with others/avoiding crowds and other measures to stay healthy this flu season.       Tobacco use disorder    She smoked for 30 years. Moving out of her boyfriend's house she thinks will help so her mother can help hold her accountable to stop. I reminded her that a battle with addiction is not over in a single moment and often it requires many attempts; she is working with a Teacher, music.         Return in about 6 months (around 01/23/2019).  Janene Madeira, MSN, NP-C [facility] for Infectious [place] Pager: [phone] Office: [phone]  07/24/18

## 2018-07-24 NOTE — Assessment & Plan Note (Signed)
She is up to date on flu and pneumonia vaccines. Discussed good hand washing, limiting close contact with others/avoiding crowds and other measures to stay healthy this flu season.

## 2018-07-24 NOTE — Assessment & Plan Note (Signed)
She smoked for 30 years. Moving out of her boyfriend's house she thinks will help so her mother can help hold her accountable to stop. I reminded her that a battle with addiction is not over in a single moment and often it requires many attempts; she is working with a Teacher, music.

## 2018-09-06 ENCOUNTER — Telehealth: Payer: Self-pay | Admitting: *Deleted

## 2018-09-06 MED ORDER — BENZONATATE 200 MG PO CAPS: 200.0000 mg | ORAL_CAPSULE | Freq: Three times a day (TID) | ORAL | 0 refills | Status: DC | PRN

## 2018-09-06 NOTE — Telephone Encounter (Signed)
Patient called, asked if [facility] [place] would refill her tessalon perls, would like this to go to [facility] where she will pick up her amoxicillin today.  She lives with her mother and son, states they all have the same symptoms (cough, nasal drainage, aches/chills, some stomach upset/diarrhea which she links to the nasal drainage). She is currently taking mucinex, feels that she is getting over the worst of the symptoms, but needs something to help with her persistent cough. She had tessalon perls at home, found them helpful, completed the supply she had. Landis Gandy, RN

## 2018-09-06 NOTE — Telephone Encounter (Signed)
Sent in one time fill. Thank you!

## 2018-09-06 NOTE — Addendum Note (Signed)
Addended by: Hessmer Callas on: 09/06/2018 02:06 PM   Modules accepted: Orders

## 2018-09-17 ENCOUNTER — Ambulatory Visit (INDEPENDENT_AMBULATORY_CARE_PROVIDER_SITE_OTHER): Payer: No Typology Code available for payment source | Admitting: Infectious Diseases

## 2018-09-17 ENCOUNTER — Encounter: Payer: Self-pay | Admitting: Infectious Diseases

## 2018-09-17 ENCOUNTER — Ambulatory Visit
Admission: RE | Admit: 2018-09-17 | Discharge: 2018-09-17 | Disposition: A | Discharge status: Home / Self Care | Payer: Medicare Other | Source: Ambulatory Visit | Attending: Infectious Diseases | Admitting: Infectious Diseases

## 2018-09-17 VITALS — BP 115/79 | HR 98 | Temp 98.0°F | Wt 169.0 lb

## 2018-09-17 DIAGNOSIS — Z87891 Personal history of nicotine dependence: Secondary | ICD-10-CM

## 2018-09-17 DIAGNOSIS — R0602 Shortness of breath: Secondary | ICD-10-CM | POA: Diagnosis not present

## 2018-09-17 DIAGNOSIS — Z86718 Personal history of other venous thrombosis and embolism: Secondary | ICD-10-CM

## 2018-09-17 DIAGNOSIS — Z91048 Other nonmedicinal substance allergy status: Secondary | ICD-10-CM | POA: Diagnosis not present

## 2018-09-17 DIAGNOSIS — R05 Cough: Secondary | ICD-10-CM

## 2018-09-17 DIAGNOSIS — J189 Pneumonia, unspecified organism: Secondary | ICD-10-CM | POA: Diagnosis not present

## 2018-09-17 DIAGNOSIS — Z91040 Latex allergy status: Secondary | ICD-10-CM | POA: Diagnosis not present

## 2018-09-17 DIAGNOSIS — J470 Bronchiectasis with acute lower respiratory infection: Secondary | ICD-10-CM

## 2018-09-17 DIAGNOSIS — R062 Wheezing: Secondary | ICD-10-CM | POA: Diagnosis not present

## 2018-09-17 DIAGNOSIS — C9201 Acute myeloblastic leukemia, in remission: Secondary | ICD-10-CM | POA: Diagnosis not present

## 2018-09-17 DIAGNOSIS — Z7901 Long term (current) use of anticoagulants: Secondary | ICD-10-CM | POA: Diagnosis not present

## 2018-09-17 DIAGNOSIS — Z8619 Personal history of other infectious and parasitic diseases: Secondary | ICD-10-CM | POA: Diagnosis not present

## 2018-09-17 DIAGNOSIS — R059 Cough, unspecified: Secondary | ICD-10-CM

## 2018-09-17 DIAGNOSIS — Z8701 Personal history of pneumonia (recurrent): Secondary | ICD-10-CM

## 2018-09-17 MED ORDER — PREDNISONE 10 MG (21) PO TBPK: ORAL_TABLET | ORAL | 0 refills | Status: DC

## 2018-09-17 NOTE — Assessment & Plan Note (Addendum)
Cough now present x 10 days, non-productive. On exam she is non-hypoxic but has some upper respiratory wheezing and reactive coughing with deep inspiration and decreased breath sounds on L but no rales/rhonchi. Will check CXR today with history of bronchiectasis. Plan to give steroid taper short course for inflammation. Encouraged to use inhalers prescribed by pulmonology team. Will plan on using levaquin if her chest x-ray returns concerning for infiltrate with h/o pseudomonas in the past on BAL.   ADDENDUM: 09/18/2018 9:12 AM CXR confirms concern for atypical pneumonia; will give levaquin 750 mg x 5d with h/o psa colonization. Repeat chest x-ray in 4 weeks.

## 2018-09-17 NOTE — Progress Notes (Addendum)
Patient: Jessica Vega  DOB: 1960/12/02 MRN: [id number] PCP: Tennis Must, MD  Pulmonologist:  Referring Provider: Hospital visit   Patient Active Problem List   Diagnosis Date Noted  . 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 Bronchiectasis ([place]) 12/09/2017  . DVT (deep venous thrombosis) ([place]) 04/04/2017  . Acute deep vein thrombosis (DVT) of brachial vein of left upper extremity ([place]) 04/03/2017  . Tobacco use disorder 04/03/2017  . Endobronchial mass 04/03/2017  . Acute myeloid leukemia in remission ([place]) 01/16/2017  . HTN (hypertension) 01/16/2017  . Atrial flutter ([place])   . Other pancytopenia ([place]) 04/30/2013     Brief Narrative:  Jessica Vega is a 57 y.o. 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   Has been working with [facility] since 07/2017 for tx of newly diagnosed bronchiectasis, worsening dyspnea on exertion/at rest and persistent pulmonary intraluminal mass. She has had numerous bronchoscopies over the years per chart review yielding pseudomonas, moraxella catarrhalis, candida glabrata (07/2014 - fungemic during presentation and tx with 4x IV antifungals then secondary prophylaxis), penicillium, saprophytic fungus. Her last bronchoscopy was performed in November 2018 - cultures were positive for pseudomonas only and she was treated with course of Levaquin with improvement.   Hospitalization at [facility] in early March 2019 with hemoptysis and pneumonia. Treated with Levaquin. Blood cultures positive for actinomyces odontolyticus at that time - we started treatment with IV penicillin and conversion to oral amoxicillin therapy for what could be pulmonary actinomyces infection.   Subjective:  CC:  Feels like she has pneumonia. Cough and SOB x  10 days. Chills.   HPI:  Her symptoms started about 10-days ago when she began feeling chills, cough and shortness of breath. She has been around some family members that have had similar symptoms "the flu". She felt like the course was improving up until 2 days ago when she resumed with chills/cough/shortnes of breath again and now with night sweats and some fatigue. No nausea, vomiting, nasal congestion/rhinorrhea, ear pain, sore throat. She has had some tessalon pearls for symptom management and has rested but she is worried she has "quiet pneumonia." Her cough is mostly dry/non-productive.   Of note she has a history of pseudomonal pneumonia in the past. She has not had her flu shot this year.    Review of Systems  Constitutional: Positive for chills, diaphoresis, fever and malaise/fatigue.  HENT: Negative for congestion, ear pain and sore throat.   Respiratory: Positive for cough and shortness of breath. Negative for sputum production.   Cardiovascular: Negative for chest pain and orthopnea.  Gastrointestinal: Negative for abdominal pain, diarrhea, nausea and vomiting.  Musculoskeletal: Negative for joint pain and myalgias.  Skin: Negative for rash.  Neurological: Negative for headaches.    Past Medical History:  Diagnosis Date  . AML (acute myeloblastic leukemia) ([place])    s/p bone marrow transplant in 9381 complicated by CMV, colitis, and sepsis  . Anemia   . Hypertension     Outpatient Medications Prior to Visit  Medication Sig Dispense Refill  . Albuterol Sulfate (PROAIR RESPICLICK) 017 (90 Base) MCG/ACT AEPB Inhale 2 puffs into the lungs every 4 (four) hours as needed. 1 each 2  . amLODipine (NORVASC) 5 MG tablet Take 5 mg by mouth daily.    Marland Kitchen amoxicillin (AMOXIL) 500 MG capsule Take  1 capsule (500 mg total) by mouth 3 (three) times daily. 120 capsule 5  . benzonatate (TESSALON) 200 MG capsule Take 1 capsule (200 mg total) by mouth 3 (three) times daily as needed for cough.  30 capsule 0  . cholecalciferol (VITAMIN D) 1000 UNITS tablet Take 1,000 Units by mouth daily after breakfast.     . Cod Liver Oil 10 MINIM CAPS Take 1 capsule by mouth.    . cyclobenzaprine (FLEXERIL) 10 MG tablet Take 1 tablet (10 mg total) by mouth daily as needed for muscle spasms. 30 tablet 0  . fluticasone (FLONASE) 50 MCG/ACT nasal spray Place 1 spray into both nostrils daily.    Marland Kitchen HYDROcodone-acetaminophen (NORCO) 5-325 MG tablet Take 1 tablet by mouth every 4 (four) hours as needed for severe pain. 6 tablet 0  . ketotifen (ZADITOR) 0.025 % ophthalmic solution Place 1 drop into both eyes 2 (two) times daily.    . Magnesium Oxide 420 (252 Mg) MG TABS Take 420 mg by mouth daily.    . methocarbamol (ROBAXIN) 500 MG tablet Take 1 tablet (500 mg total) by mouth 2 (two) times daily. 20 tablet 0  . nicotine (NICODERM CQ - DOSED IN MG/24 HOURS) 14 mg/24hr patch Place 1 patch (14 mg total) onto the skin daily. 30 patch 0  . omeprazole (PRILOSEC) 20 MG capsule Take 20 mg by mouth at bedtime.     . potassium chloride (K-DUR) 10 MEQ tablet Take 10 mEq by mouth 3 (three) times daily.    . rivaroxaban (XARELTO) 20 MG TABS tablet Take 20 mg by mouth daily with supper.    . Rivaroxaban 15 & 20 MG TBPK Take as directed on package: Start with one 15mg  tablet by mouth twice a day with food. On Day 22, switch to one 20mg  tablet once a day with food. 51 each 0   No facility-administered medications prior to visit.      Allergies  Allergen Reactions  . Chlorhexidine Rash  . Other Itching    Transparent dressing causes itching per pt  . Latex Rash    Social History   Tobacco Use  . Smoking status: Former Smoker    Packs/day: 1.00    Types: Cigarettes  . Smokeless tobacco: Never Used  Substance Use Topics  . Alcohol use: Yes    Alcohol/week: 1.0 standard drinks    Types: 1 Glasses of wine per week    Comment: rarely  . Drug use: No    Objective:   Vitals:   09/17/18 1551  BP: 115/79    Pulse: 98  Temp: 98 F (36.7 C)  Weight: 169 lb (76.7 kg)   Body mass index is 30.18 kg/m.  Physical Exam Constitutional:      Appearance: She is well-developed. She is not toxic-appearing.     Comments: Seated comfortably in chair in no distress. Coughing intermittently during conversation and with deep breaths.   HENT:     Nose: No congestion or rhinorrhea.     Mouth/Throat:     Mouth: Mucous membranes are moist. No oral lesions.     Dentition: Normal dentition. No dental abscesses.     Pharynx: Oropharynx is clear. No oropharyngeal exudate.  Eyes:     Conjunctiva/sclera: Conjunctivae normal.  Cardiovascular:     Rate and Rhythm: Normal rate and regular rhythm.     Heart sounds: Normal heart sounds. No murmur.  Pulmonary:     Effort: Pulmonary effort is normal.  Breath sounds: Normal breath sounds.     Comments: Diminished L>R base, No significant rhonchi but some upper airway wheezing. Shallow inspiratory effort.  Skin:    General: Skin is warm and dry.     Findings: No rash.  Neurological:     Mental Status: She is alert and oriented to person, place, and time.  Psychiatric:        Mood and Affect: Mood normal.        Judgment: Judgment normal.      Lab Results: Lab Results  Component Value Date   WBC 7.9 03/15/2018   HGB 15.1 (H) 03/15/2018   HCT 44.4 03/15/2018   MCV 92.3 03/15/2018   PLT 293 03/15/2018    Lab Results  Component Value Date   CREATININE 1.05 (H) 03/15/2018   BUN 11 03/15/2018   NA 140 03/15/2018   K 3.3 (L) 03/15/2018   CL 101 03/15/2018   CO2 29 03/15/2018    Lab Results  Component Value Date   ALT 21 04/03/2017   AST 23 04/03/2017   ALKPHOS 68 04/03/2017   BILITOT 0.9 04/03/2017    IMAGING REVIEW: 06/17/13: Invasive fungal pneumonia by chest CT findings, initially treated with Vfend (started 06/18/13), but transitioned to posaconazole (06/23/13) secondary to worsening CT findings  2016 CT scan: A soft tissue density is  noted in the bronchiectatic segment to the middle lobe. This may arise within the bronchus or represent external compression/invasion. This is centrally located, and may be amenable to bronchoscopic evaluation. Bronchiectatic changes and scarring of RML.   04/2017 Chest CT: Redemonstration of a right upper lobe endobronchial mass measuring 2.2 cm with associated surrounding bronchiectasis and parenchymal architectural distortion. This lesion has been minimally changed relative to CT imaging of the chest dating back to 2015, and it likely relates to chronic fungal infection.  04/2017 Head/Neck CT: Deep venous thrombosis in the left subclavian, brachiocephalic, and lower jugular veins  08/2017 Lung Biopsy and bronchial washings - respiratory mucosa and squamous epithelium with chronic and focal acute inflammation; fragment of tissue with fungal hyphae present on routine H&E section but fungal stains (GMS/PAS) were negative for fungal organisms.  BAL aspergillosis galactomanan - negative   Previous serum aspergillosis galactomanan's also negative.   Pseudomonas >100k colonies - pansensitive   Second strain of Pseudomonas growing 50k colonies - Intermediate to aztreonam  10/10/2017 - respiratory samples negative for AFB  06/2018 - Chest CT: Mild centrilobular emphysema. No change in RML bronchiectasis with associated architectural distortion and endobronchial lesion measuring 11 mm. RLL micro-nodules that is stable. Unchanged nodular scarring in the RUL. Improvement in ground glass opacities.   Assessment & Plan:   Problem List Items Addressed This Visit      Unprioritized   Bronchiectasis ([place]) - Primary    Cough now present x 10 days. Non productive. She is non-hypoxic but has some upper respiratory wheezing and reactive coughing with deep inspiration. Some decreased breath sounds on R but no rales/rhonchi. Will check CXR today with history of bronchiectasis. Plan to give steroid taper short  course for inflammation. Encouraged to use inhalers prescribed by pulmonology team. Will plan on using levaquin if her chest x-ray returns concerning for infiltrate with h/o pseudomonas in the past on BAL.        Other Visit Diagnoses    Cough       Relevant Orders   DG Chest 2 View      Janene Madeira, MSN, NP-C [facility] for  Infectious Disease [facility] Pager: [phone] Office: [phone]  09/17/18

## 2018-09-17 NOTE — Patient Instructions (Addendum)
Will check your chest x-ray today.   Will give you some steroids to take to help with inflammation.  Day 1: take 6 pills  Day 2: take 5 pills Day 3: take 4 pills Day 4: take 3 pills Day 5: take 2 pills Day 6: take 1 pill   Will call you tomorrow to see if we need to add any antibiotics.

## 2018-09-18 MED ORDER — LEVOFLOXACIN 750 MG PO TABS: 750.0000 mg | ORAL_TABLET | Freq: Every day | ORAL | 0 refills | Status: DC

## 2019-01-28 ENCOUNTER — Other Ambulatory Visit: Payer: Self-pay

## 2019-01-28 ENCOUNTER — Ambulatory Visit (INDEPENDENT_AMBULATORY_CARE_PROVIDER_SITE_OTHER): Payer: No Typology Code available for payment source | Admitting: Infectious Diseases

## 2019-01-28 ENCOUNTER — Encounter: Payer: Self-pay | Admitting: Infectious Diseases

## 2019-01-28 VITALS — BP 157/100 | HR 109 | Temp 98.9°F | Wt 170.0 lb

## 2019-01-28 DIAGNOSIS — J470 Bronchiectasis with acute lower respiratory infection: Secondary | ICD-10-CM

## 2019-01-28 DIAGNOSIS — A429 Actinomycosis, unspecified: Secondary | ICD-10-CM | POA: Diagnosis not present

## 2019-01-28 NOTE — Patient Instructions (Signed)
You seem to be doing very well! Please finish your current bottle of amoxicillin and then stop.   Would like to see you back in 3-4 months to see how you are doing off the antibiotics. Would like to get you for a repeat chest CT scan in the fall to follow up (sooner if you start having trouble).   Please see when you have follow up with your lung doctor in [place] - I want to make sure that they keep up with you because they will be there for you in the long term for care of your lungs.   I see there is a potential for interaction with the cod liver oil and your xarelto that may increase bleeding times - I think if you don't absolutely need the cod liver oil you should stop it and continue the b vitamins alone. Zinc is a good one to substitute and add to a immune boosting regimen if you are interested.

## 2019-01-30 NOTE — Assessment & Plan Note (Signed)
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 Previous imaging reviewed and resolution of ground glass opacities and from what I can tell from radiology's measurements of the endobronchial lesion this his reduced. Have her return in 4 months to follow up with her and see how she is doing off antibiotics. Would like to see one more scan for her to see if there has been any further improvement after 6 more months of therapy. She knows to call should she have hospitalization for pneumonia, hemoptysis or worsening of pulmonary symptoms over the next few months.

## 2019-01-30 NOTE — Progress Notes (Signed)
Patient: Jessica Vega  DOB: 07-Jul-1961 MRN: [id number] PCP: Tennis Must, MD  Pulmonologist:  Referring Provider: Hospital visit   Patient Active Problem List   Diagnosis Date Noted  . 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 Bronchiectasis ([place]) 12/09/2017  . DVT (deep venous thrombosis) ([place]) 04/04/2017  . Acute deep vein thrombosis (DVT) of brachial vein of left upper extremity ([place]) 04/03/2017  . Tobacco use disorder 04/03/2017  . Endobronchial mass 04/03/2017  . Acute myeloid leukemia in remission ([place]) 01/16/2017  . HTN (hypertension) 01/16/2017  . Atrial flutter ([place])   . Other pancytopenia ([place]) 04/30/2013     Brief Narrative:  Jessica Vega is a 58 y.o. 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   Has been working with [facility] since 07/2017 for tx of newly diagnosed bronchiectasis, worsening dyspnea on exertion/at rest and persistent pulmonary intraluminal mass. She has had numerous bronchoscopies over the years per chart review yielding pseudomonas, moraxella catarrhalis, candida glabrata (07/2014 - fungemic during presentation and tx with 4x IV antifungals then secondary prophylaxis), penicillium, saprophytic fungus. Her last bronchoscopy was performed in November 2018 - cultures were positive for pseudomonas only and she was treated with course of Levaquin with improvement.   Hospitalization at [facility] in early March 2019 with hemoptysis and pneumonia. Treated with Levaquin. Blood cultures positive for actinomyces odontolyticus at that time - we started treatment with IV penicillin and conversion to oral amoxicillin therapy for what could be pulmonary actinomyces infection.   Subjective:  CC:  Follow up suspected pulmonary actinomyces  infection. Feeling well. Increased stress at home. Still smoking a little but trying.   HPI:  Last office visit was with me in December 2019 where she was worked in for acute visit for community acquired pneumonia. Since that time she has been feeling very well. She continues her Amoxicillin treatment and has not missed any doses. She has had no trouble with side effects. Has not been exercising as much but hopes to get back to it soon once COVID19 restrictions are lifted. . Still occasionally wheezing but no productive cough.   She has had some increased stress at home with attempting to care for her sister who is a drug addict. She has had a hard time trying to keep her safe but realizes that she cannot impact her like she had hoped when she moved in.    Review of Systems  Constitutional: Negative for chills and fever.  HENT: Negative for tinnitus.   Eyes: Negative for blurred vision and photophobia.  Respiratory: Positive for cough and wheezing. Negative for sputum production and shortness of breath.   Cardiovascular: Negative for chest pain.  Gastrointestinal: Negative for diarrhea, nausea and vomiting.  Genitourinary: Negative for dysuria.  Skin: Negative for rash.  Neurological: Negative for headaches.    Past Medical History:  Diagnosis Date  . AML (acute myeloblastic leukemia) ([place])    s/p bone marrow transplant in 0240 complicated by CMV, colitis, and sepsis  . Anemia   . Hypertension     Outpatient Medications Prior to Visit  Medication Sig Dispense Refill  . Albuterol Sulfate (PROAIR RESPICLICK) 973 (90 Base) MCG/ACT AEPB Inhale 2 puffs into the lungs every 4 (four) hours as needed. 1 each 2  . amLODipine (NORVASC) 5 MG tablet Take 5 mg by mouth  daily.    . amoxicillin (AMOXIL) 500 MG capsule Take 1 capsule (500 mg total) by mouth 3 (three) times daily. 120 capsule 5  . atorvastatin (LIPITOR) 40 MG tablet Take 40 mg by mouth daily.    . benzonatate (TESSALON) 200 MG  capsule Take 1 capsule (200 mg total) by mouth 3 (three) times daily as needed for cough. 30 capsule 0  . cholecalciferol (VITAMIN D) 1000 UNITS tablet Take 1,000 Units by mouth daily after breakfast.     . Cod Liver Oil 10 MINIM CAPS Take 1 capsule by mouth.    . cyclobenzaprine (FLEXERIL) 10 MG tablet Take 1 tablet (10 mg total) by mouth daily as needed for muscle spasms. 30 tablet 0  . fluticasone (FLONASE) 50 MCG/ACT nasal spray Place 1 spray into both nostrils daily.    Marland Kitchen HYDROcodone-acetaminophen (NORCO) 5-325 MG tablet Take 1 tablet by mouth every 4 (four) hours as needed for severe pain. 6 tablet 0  . ketotifen (ZADITOR) 0.025 % ophthalmic solution Place 1 drop into both eyes 2 (two) times daily.    Marland Kitchen levofloxacin (LEVAQUIN) 750 MG tablet Take 1 tablet (750 mg total) by mouth daily. 5 tablet 0  . Magnesium Oxide 420 (252 Mg) MG TABS Take 420 mg by mouth daily.    . metFORMIN (GLUCOPHAGE) 500 MG tablet Take 500 mg by mouth 2 (two) times daily with a meal.    . methocarbamol (ROBAXIN) 500 MG tablet Take 1 tablet (500 mg total) by mouth 2 (two) times daily. 20 tablet 0  . nicotine (NICODERM CQ - DOSED IN MG/24 HOURS) 14 mg/24hr patch Place 1 patch (14 mg total) onto the skin daily. 30 patch 0  . omeprazole (PRILOSEC) 20 MG capsule Take 20 mg by mouth at bedtime.     . potassium chloride (K-DUR) 10 MEQ tablet Take 10 mEq by mouth 3 (three) times daily.    . predniSONE (STERAPRED UNI-PAK 21 TAB) 10 MG (21) TBPK tablet Take 6 pills on day one, 5 pills day 2, 4 pills day 3-2-1 21 tablet 0  . rivaroxaban (XARELTO) 20 MG TABS tablet Take 20 mg by mouth daily with supper.    . Rivaroxaban 15 & 20 MG TBPK Take as directed on package: Start with one 15mg  tablet by mouth twice a day with food. On Day 22, switch to one 20mg  tablet once a day with food. 51 each 0   No facility-administered medications prior to visit.      Allergies  Allergen Reactions  . Chlorhexidine Rash  . Other Itching     Transparent dressing causes itching per pt  . Latex Rash    Social History   Tobacco Use  . Smoking status: Former Smoker    Packs/day: 1.00    Types: Cigarettes  . Smokeless tobacco: Never Used  Substance Use Topics  . Alcohol use: Yes    Alcohol/week: 1.0 standard drinks    Types: 1 Glasses of wine per week    Comment: rarely  . Drug use: No    Objective:   Vitals:   01/28/19 1455  BP: (!) 157/100  Pulse: (!) 109  Temp: 98.9 F (37.2 C)  Weight: 170 lb (77.1 kg)   Body mass index is 30.35 kg/m.  Physical Exam Constitutional:      Appearance: She is well-developed.     Comments: Seated comfortably in chair.   HENT:     Mouth/Throat:     Mouth: No oral lesions.  Dentition: Normal dentition. No dental abscesses.     Pharynx: No oropharyngeal exudate.  Cardiovascular:     Rate and Rhythm: Normal rate and regular rhythm.     Heart sounds: Normal heart sounds.  Pulmonary:     Effort: Pulmonary effort is normal.     Breath sounds: No wheezing.     Comments: Few rhonchi in R upper posterior field.  Abdominal:     General: There is no distension.     Palpations: Abdomen is soft.     Tenderness: There is no abdominal tenderness.  Lymphadenopathy:     Cervical: No cervical adenopathy.  Skin:    General: Skin is warm and dry.     Findings: No rash.  Neurological:     Mental Status: She is alert and oriented to person, place, and time.  Psychiatric:        Judgment: Judgment normal.     Comments: In good spirits today and engaged in care discussion      Lab Results: Lab Results  Component Value Date   WBC 7.9 03/15/2018   HGB 15.1 (H) 03/15/2018   HCT 44.4 03/15/2018   MCV 92.3 03/15/2018   PLT 293 03/15/2018    Lab Results  Component Value Date   CREATININE 1.05 (H) 03/15/2018   BUN 11 03/15/2018   NA 140 03/15/2018   K 3.3 (L) 03/15/2018   CL 101 03/15/2018   CO2 29 03/15/2018    Lab Results  Component Value Date   ALT 21 04/03/2017    AST 23 04/03/2017   ALKPHOS 68 04/03/2017   BILITOT 0.9 04/03/2017    IMAGING REVIEW: 06/17/13: Invasive fungal pneumonia by chest CT findings, initially treated with Vfend (started 06/18/13), but transitioned to posaconazole (06/23/13) secondary to worsening CT findings  2016 CT scan: A soft tissue density is noted in the bronchiectatic segment to the middle lobe. This may arise within the bronchus or represent external compression/invasion. This is centrally located, and may be amenable to bronchoscopic evaluation. Bronchiectatic changes and scarring of RML.   04/2017 Chest CT: Redemonstration of a right upper lobe endobronchial mass measuring 2.2 cm with associated surrounding bronchiectasis and parenchymal architectural distortion. This lesion has been minimally changed relative to CT imaging of the chest dating back to 2015, and it likely relates to chronic fungal infection.  04/2017 Head/Neck CT: Deep venous thrombosis in the left subclavian, brachiocephalic, and lower jugular veins  08/2017 Lung Biopsy and bronchial washings - respiratory mucosa and squamous epithelium with chronic and focal acute inflammation; fragment of tissue with fungal hyphae present on routine H&E section but fungal stains (GMS/PAS) were negative for fungal organisms.  BAL aspergillosis galactomanan - negative   Previous serum aspergillosis galactomanan - negative.   Pseudomonas >100k colonies - pansensitive   Second strain of Pseudomonas growing 50k colonies - Intermediate to aztreonam  10/10/2017 - respiratory samples negative for AFB  06/2018 - Chest CT: Mild centrilobular emphysema. No change in RML bronchiectasis with associated architectural distortion and endobronchial lesion measuring 11 mm. RLL micro-nodules that is stable. Unchanged nodular scarring in the RUL. Improvement in ground glass opacities.   Assessment & Plan:   Problem List Items Addressed This Visit      Unprioritized   Bronchiectasis  ([place])    She seems to be doing well. Has a follow up with pulmonology team soon but not sure when. She will call to make an appointment. Likely due for repeat CT scan in Aug/September.  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 Previous imaging reviewed and resolution of ground glass opacities and from what I can tell from radiology's measurements of the endobronchial lesion this his reduced. Have her return in 4 months to follow up with her and see how she is doing off antibiotics. Would like to see one more scan for her to see if there has been any further improvement after 6 more months of therapy. She knows to call should she have hospitalization for pneumonia, hemoptysis or worsening of pulmonary symptoms over the next few months.          Janene Madeira, MSN, NP-C [facility] [place] Pager: [phone] Office: [phone]  01/30/19

## 2019-01-30 NOTE — Assessment & Plan Note (Addendum)
She seems to be doing well. Has a follow up with pulmonology team soon but not sure when. She will call to make an appointment. Likely due for repeat CT scan in Aug/September.

## 2019-05-28 ENCOUNTER — Telehealth: Payer: Self-pay | Admitting: Infectious Diseases

## 2019-05-28 NOTE — Telephone Encounter (Signed)
COVID-19 Pre-Screening Questions: ° °Do you currently have a fever (>100 °F), chills or unexplained body aches? N ° °Are you currently experiencing new cough, shortness of breath, sore throat, runny nose? N °•  °Have you recently travelled outside the state of [place] in the last 14 days? N  °•  °Have you been in contact with someone that is currently pending confirmation of Covid19 testing or has been confirmed to have the Covid19 virus?  N ° °**If the patient answers NO to ALL questions -  advise the patient to please call the clinic before coming to the office should any symptoms develop.  ° ° ° °

## 2019-05-29 ENCOUNTER — Other Ambulatory Visit: Payer: Self-pay

## 2019-05-29 ENCOUNTER — Encounter: Payer: Self-pay | Admitting: Infectious Diseases

## 2019-05-29 ENCOUNTER — Ambulatory Visit (INDEPENDENT_AMBULATORY_CARE_PROVIDER_SITE_OTHER): Payer: No Typology Code available for payment source | Admitting: Infectious Diseases

## 2019-05-29 VITALS — BP 109/77 | HR 86 | Temp 98.8°F

## 2019-05-29 DIAGNOSIS — J479 Bronchiectasis, uncomplicated: Secondary | ICD-10-CM

## 2019-05-29 DIAGNOSIS — R918 Other nonspecific abnormal finding of lung field: Secondary | ICD-10-CM

## 2019-05-29 DIAGNOSIS — F17200 Nicotine dependence, unspecified, uncomplicated: Secondary | ICD-10-CM | POA: Diagnosis not present

## 2019-05-29 DIAGNOSIS — A429 Actinomycosis, unspecified: Secondary | ICD-10-CM

## 2019-05-29 NOTE — Patient Instructions (Addendum)
So proud of your weight loss!!  No more antibiotics at this time. I am so pleased to hear that you have done well since stopping.   I would love to see you stop smoking for your health - I understand it is a work in progress for you. Please continue to put forth effort. You can do it!  Please follow up with your pulmonology (lung) team as you are scheduled.   No further need to follow up here unless something concerning on your CT scans that they would like you to see [place] back for.

## 2019-05-29 NOTE — Progress Notes (Signed)
Patient: Jessica Vega  DOB: [birth year] MRN: [id number] PCP: Tennis Must, MD    Patient Active Problem List   Diagnosis Date Noted  . 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 Bronchiectasis ([place]) 12/09/2017  . DVT (deep venous thrombosis) ([place]) 04/04/2017  . Acute deep vein thrombosis (DVT) of brachial vein of left upper extremity ([place]) 04/03/2017  . Tobacco use disorder 04/03/2017  . Endobronchial mass 04/03/2017  . Acute myeloid leukemia in remission ([place]) 01/16/2017  . HTN (hypertension) 01/16/2017  . Atrial flutter ([place])   . Other pancytopenia ([place]) 04/30/2013     Brief Narrative:  CATARINA TIGUE is a 58 y.o. 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   Has been working with [facility] since 07/2017 for tx of newly diagnosed bronchiectasis, worsening dyspnea on exertion/at rest and persistent pulmonary intraluminal mass. She has had numerous bronchoscopies over the years per chart review yielding pseudomonas, moraxella catarrhalis, candida glabrata (07/2014 - fungemic during presentation and tx with 4x IV antifungals then secondary prophylaxis), penicillium, saprophytic fungus. Her last bronchoscopy was performed in November 2018 - cultures were positive for pseudomonas only and she was treated with course of Levaquin with improvement.   Hospitalization at [facility] in early March 2019 with hemoptysis and pneumonia. Treated with Levaquin. Blood cultures positive for actinomyces odontolyticus at that time - we started treatment with IV penicillin and conversion to oral amoxicillin therapy for what could be pulmonary actinomyces infection. She completed 14 months of PCN-->amoxicillin for this with last dose of antibiotic in May 2020.    Subjective:  CC:  Follow up suspected pulmonary actinomyces infection. Feeling well. Lost 20 lbs, walking 3-5 miles a day now and has very little cough. Smoking still.    HPI:  She has moved back in with her mother and stress is lighter which she is thankful for. She is no longer with her ex-boyfriend and has been at least partially removed from other family stressors. She reports that she has started a walking program where she walks 3-5 miles a day now and has lost over 20 lbs. She is feeling the best she has in some time. Unfortunately she has continued to smoke cigarettes but is still committed to try to reduce to stop again. Very worried about COVID-19 and has been sheltering in place still with occasional contact with her young grandchildren.   Scheduled to follow up with pulmonology team in October with repeat CT scan. She also has a follow up with her oncologist for survivorship reassessment - she is nervous about this visit and had been putting it off.   Review of Systems  Constitutional: Negative for chills and fever.  HENT: Negative for tinnitus.   Eyes: Negative for blurred vision and photophobia.  Respiratory: Negative for cough, sputum production, shortness of breath and wheezing.   Cardiovascular: Negative for chest pain.  Gastrointestinal: Negative for diarrhea, nausea and vomiting.  Genitourinary: Negative for dysuria.  Skin: Negative for rash.  Neurological: Negative for headaches.    Past Medical History:  Diagnosis Date  . AML (acute myeloblastic leukemia) ([place])    s/p bone marrow transplant in [id number] complicated by CMV, colitis, and sepsis  . Anemia   . Hypertension     Outpatient Medications Prior to Visit  Medication Sig Dispense Refill  .  Albuterol Sulfate (PROAIR RESPICLICK) [id number] (90 Base) MCG/ACT AEPB Inhale 2 puffs into the lungs every 4 (four) hours as needed. 1 each 2  . amLODipine (NORVASC) 5 MG tablet Take 5 mg by mouth daily.    Marland Kitchen atorvastatin (LIPITOR)  40 MG tablet Take 40 mg by mouth daily.    . benzonatate (TESSALON) 200 MG capsule Take 1 capsule (200 mg total) by mouth 3 (three) times daily as needed for cough. 30 capsule 0  . cholecalciferol (VITAMIN D) 1000 UNITS tablet Take 1,000 Units by mouth daily after breakfast.     . Cod Liver Oil 10 MINIM CAPS Take 1 capsule by mouth.    . cyclobenzaprine (FLEXERIL) 10 MG tablet Take 1 tablet (10 mg total) by mouth daily as needed for muscle spasms. 30 tablet 0  . fluticasone (FLONASE) 50 MCG/ACT nasal spray Place 1 spray into both nostrils daily.    Marland Kitchen HYDROcodone-acetaminophen (NORCO) 5-325 MG tablet Take 1 tablet by mouth every 4 (four) hours as needed for severe pain. 6 tablet 0  . ketotifen (ZADITOR) 0.025 % ophthalmic solution Place 1 drop into both eyes 2 (two) times daily.    Marland Kitchen levofloxacin (LEVAQUIN) 750 MG tablet Take 1 tablet (750 mg total) by mouth daily. 5 tablet 0  . Magnesium Oxide 420 (252 Mg) MG TABS Take 420 mg by mouth daily.    . metFORMIN (GLUCOPHAGE) 500 MG tablet Take 500 mg by mouth 2 (two) times daily with a meal.    . methocarbamol (ROBAXIN) 500 MG tablet Take 1 tablet (500 mg total) by mouth 2 (two) times daily. 20 tablet 0  . nicotine (NICODERM CQ - DOSED IN MG/24 HOURS) 14 mg/24hr patch Place 1 patch (14 mg total) onto the skin daily. 30 patch 0  . omeprazole (PRILOSEC) 20 MG capsule Take 20 mg by mouth at bedtime.     . potassium chloride (K-DUR) 10 MEQ tablet Take 10 mEq by mouth 3 (three) times daily.    . predniSONE (STERAPRED UNI-PAK 21 TAB) 10 MG (21) TBPK tablet Take 6 pills on day one, 5 pills day 2, 4 pills day 3-2-1 21 tablet 0  . rivaroxaban (XARELTO) 20 MG TABS tablet Take 20 mg by mouth daily with supper.    . Rivaroxaban 15 & 20 MG TBPK Take as directed on package: Start with one 15mg  tablet by mouth twice a day with food. On Day 22, switch to one 20mg  tablet once a day with food. 51 each 0  . amoxicillin (AMOXIL) 500 MG capsule Take 1 capsule (500 mg total)  by mouth 3 (three) times daily. (Patient not taking: Reported on 05/29/2019) 120 capsule 5   No facility-administered medications prior to visit.      Allergies  Allergen Reactions  . Chlorhexidine Rash  . Other Itching    Transparent dressing causes itching per pt  . Latex Rash    Social History   Tobacco Use  . Smoking status: Former Smoker    Packs/day: 1.00    Types: Cigarettes  . Smokeless tobacco: Never Used  Substance Use Topics  . Alcohol use: Yes    Alcohol/week: 1.0 standard drinks    Types: 1 Glasses of wine per week    Comment: rarely  . Drug use: No    Objective:   Vitals:   05/29/19 1506  BP: 109/77  Pulse: 86  Temp: 98.8 F (37.1 C)   There is no height or weight on file to calculate BMI.  Physical Exam Vitals signs reviewed.  Constitutional:      Appearance: She is not ill-appearing.  HENT:     Mouth/Throat:     Mouth: Mucous membranes are moist.     Pharynx: No oropharyngeal exudate.  Cardiovascular:     Rate and Rhythm: Normal rate and regular rhythm.     Heart sounds: No murmur.  Pulmonary:     Effort: Pulmonary effort is normal.     Breath sounds: Normal breath sounds. No wheezing or rhonchi.  Skin:    General: Skin is warm and dry.     Capillary Refill: Capillary refill takes less than 2 seconds.  Neurological:     Mental Status: She is alert and oriented to person, place, and time.     Lab Results: Lab Results  Component Value Date   WBC 7.9 03/15/2018   HGB 15.1 (H) 03/15/2018   HCT 44.4 03/15/2018   MCV 92.3 03/15/2018   PLT 293 03/15/2018    Lab Results  Component Value Date   CREATININE 1.05 (H) 03/15/2018   BUN 11 03/15/2018   NA 140 03/15/2018   K 3.3 (L) 03/15/2018   CL 101 03/15/2018   CO2 29 03/15/2018    Lab Results  Component Value Date   ALT 21 04/03/2017   AST 23 04/03/2017   ALKPHOS 68 04/03/2017   BILITOT 0.9 04/03/2017    IMAGING REVIEW: 06/17/13: Invasive fungal pneumonia by chest CT findings,  initially treated with Vfend (started 06/18/13), but transitioned to posaconazole (06/23/13) secondary to worsening CT findings  2016 CT scan: A soft tissue density is noted in the bronchiectatic segment to the middle lobe. This may arise within the bronchus or represent external compression/invasion. This is centrally located, and may be amenable to bronchoscopic evaluation. Bronchiectatic changes and scarring of RML.   04/2017 Chest CT: Redemonstration of a right upper lobe endobronchial mass measuring 2.2 cm with associated surrounding bronchiectasis and parenchymal architectural distortion. This lesion has been minimally changed relative to CT imaging of the chest dating back to 2015, and it likely relates to chronic fungal infection.  04/2017 Head/Neck CT: Deep venous thrombosis in the left subclavian, brachiocephalic, and lower jugular veins  08/2017 Lung Biopsy and bronchial washings - respiratory mucosa and squamous epithelium with chronic and focal acute inflammation; fragment of tissue with fungal hyphae present on routine H&E section but fungal stains (GMS/PAS) were negative for fungal organisms.  BAL aspergillosis galactomanan - negative   Previous serum aspergillosis galactomanan - negative.   Pseudomonas >100k colonies - pansensitive   Second strain of Pseudomonas growing 50k colonies - Intermediate to aztreonam  10/10/2017 - respiratory samples negative for AFB  06/2018 - Chest CT: Mild centrilobular emphysema. No change in RML bronchiectasis with associated architectural distortion and endobronchial lesion measuring 11 mm. RLL micro-nodules that is stable. Unchanged nodular scarring in the RUL. Improvement in ground glass opacities.   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   She will follow up with her pulmonology team as scheduled and return here as needed.         Unprioritized   Tobacco use disorder    Counseled and encouraged to  quit. She is using nicotine patches and gum to help along with stress management.       Endobronchial mass - Primary    Thought to be fungal but appears to have reduced in size with prolonged penicillin treatment. Follow up with repeated CT scan with pulmonology at upcoming appointment.       Bronchiectasis [facility])    She has had no exacerbations requiring additional antibiotics since January of this year. Doing well and implemented a walking program to help with weight loss and lung rehab.  Following up with pulmonology team in October. She is quite clear on lung assessment today and is breathing comfortably without cough demonstrated.          Janene Madeira, MSN, NP-C [facility] for Infectious [place] Pager: [phone] Office: [phone]  05/30/19

## 2019-05-30 ENCOUNTER — Encounter: Payer: Self-pay | Admitting: Infectious Diseases

## 2019-05-30 ENCOUNTER — Ambulatory Visit: Payer: Medicare Other | Admitting: Infectious Diseases

## 2019-05-30 NOTE — Assessment & Plan Note (Signed)
Thought to be fungal but appears to have reduced in size with prolonged penicillin treatment. Follow up with repeated CT scan with pulmonology at upcoming appointment.

## 2019-05-30 NOTE — Assessment & Plan Note (Signed)
She has done very well now 3 months off amoxicillin following 14 month treatment course for actinomyces bacteremia thought to be either related to pulmonary source vs jugular vein DVT (which has improved, remains anticoagulated).   She will follow up with her pulmonology team as scheduled and return here as needed.

## 2019-05-30 NOTE — Assessment & Plan Note (Addendum)
She has had no exacerbations requiring additional antibiotics since January of this year. Doing well and implemented a walking program to help with weight loss and lung rehab.  Following up with pulmonology team in October. She is quite clear on lung assessment today and is breathing comfortably without cough demonstrated.

## 2019-05-30 NOTE — Assessment & Plan Note (Addendum)
Counseled and encouraged to quit. She is using nicotine patches and gum to help along with stress management.

## 2019-06-16 IMAGING — US IR PICC >5YO
1 series · 3 of 3 positions shown · IV contrast (agent unspecified)
Comparison: none

INDICATION: PNEUMONIA, ACCESS FOR LONG-TERM ANTIBIOTICS

EXAM:
ULTRASOUND AND FLUOROSCOPIC GUIDED PICC LINE INSERTION
MEDICATIONS:
1% lidocaine local
CONTRAST:  None
FLUOROSCOPY TIME:  Twelve seconds (1 mGy)
COMPLICATIONS:
None immediate.
TECHNIQUE: The procedure, risks, benefits, and alternatives were explained to
the patient and informed written consent was obtained. A timeout was
performed prior to the initiation of the procedure.

[Series 1: ir picc >5yo · 3 of 3 slices shown]
[im 1/3]
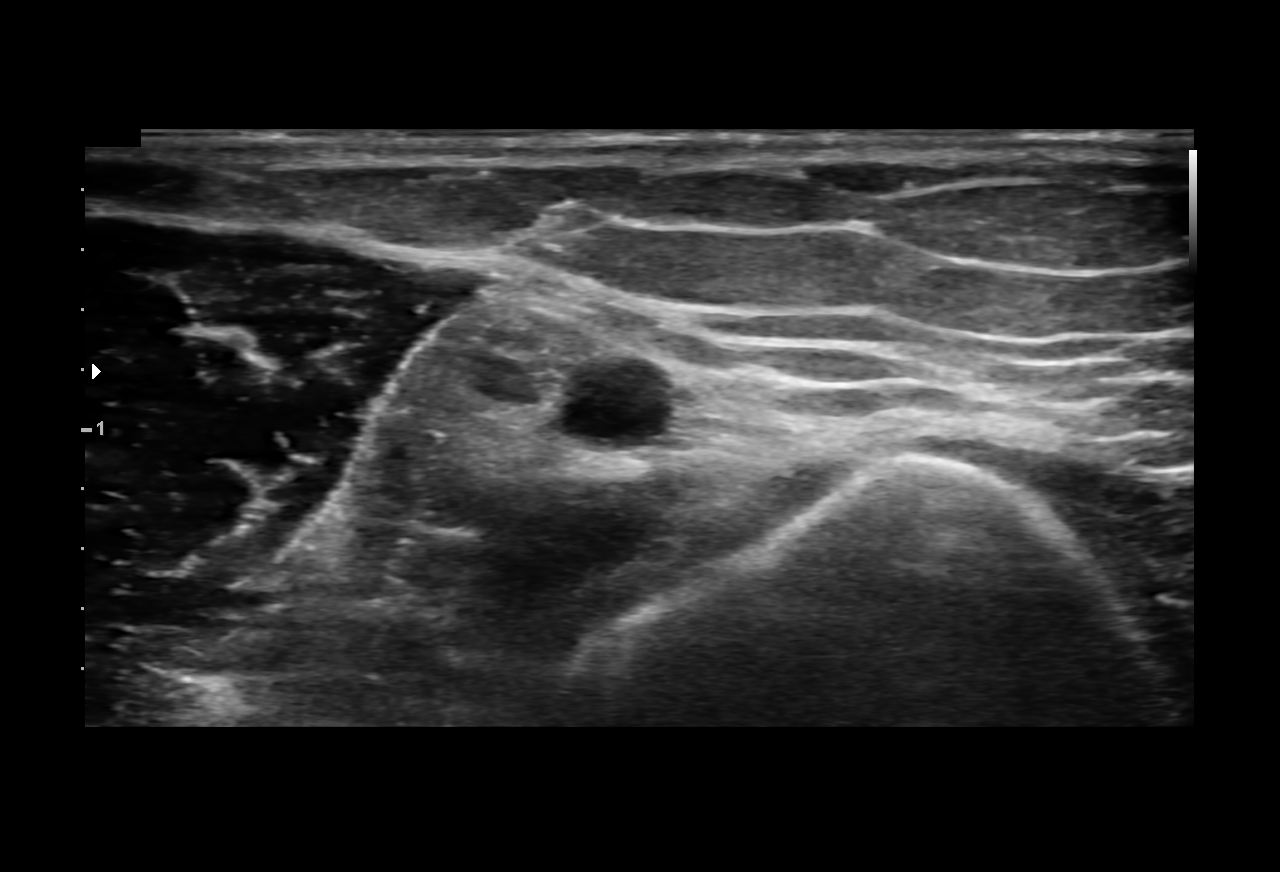
[im 2/3]
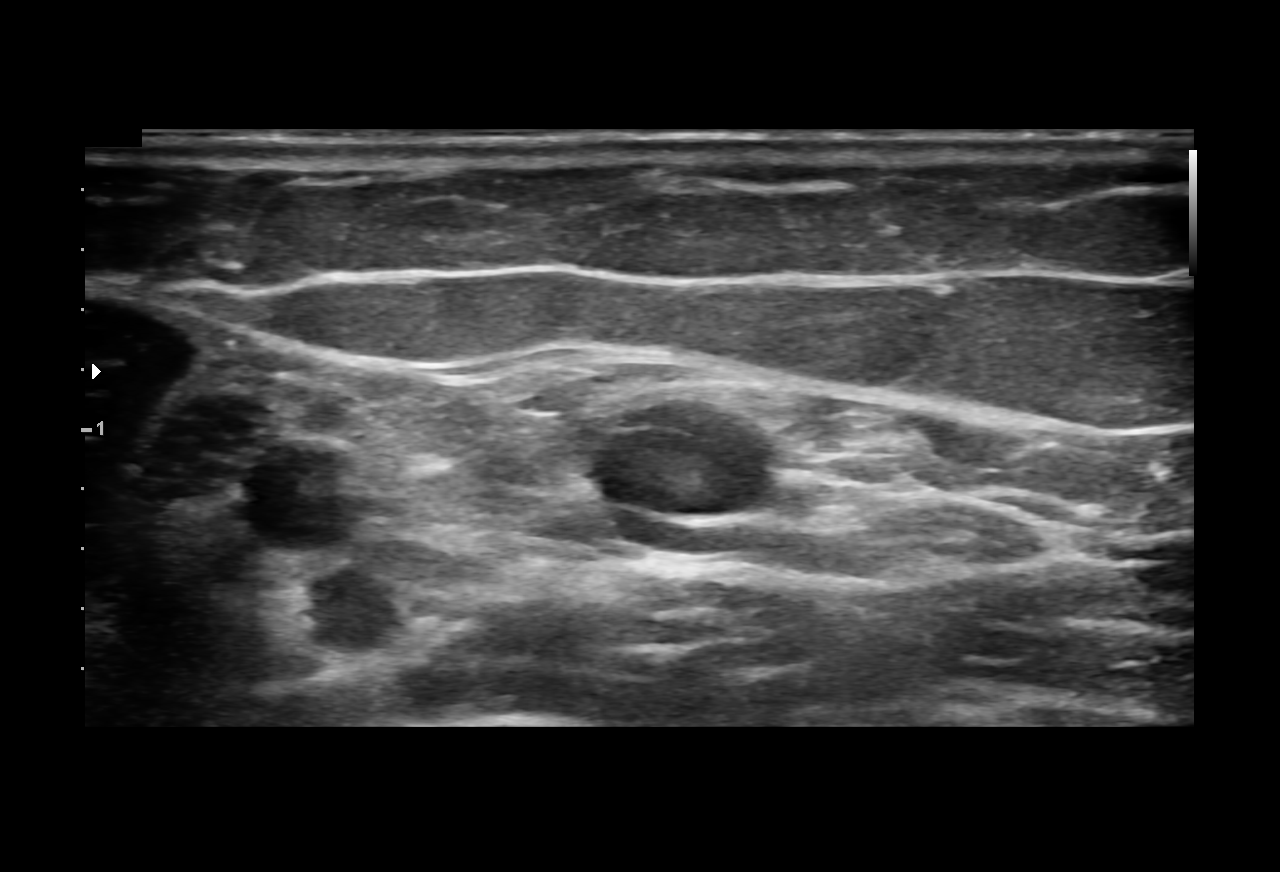
[im 3/3]
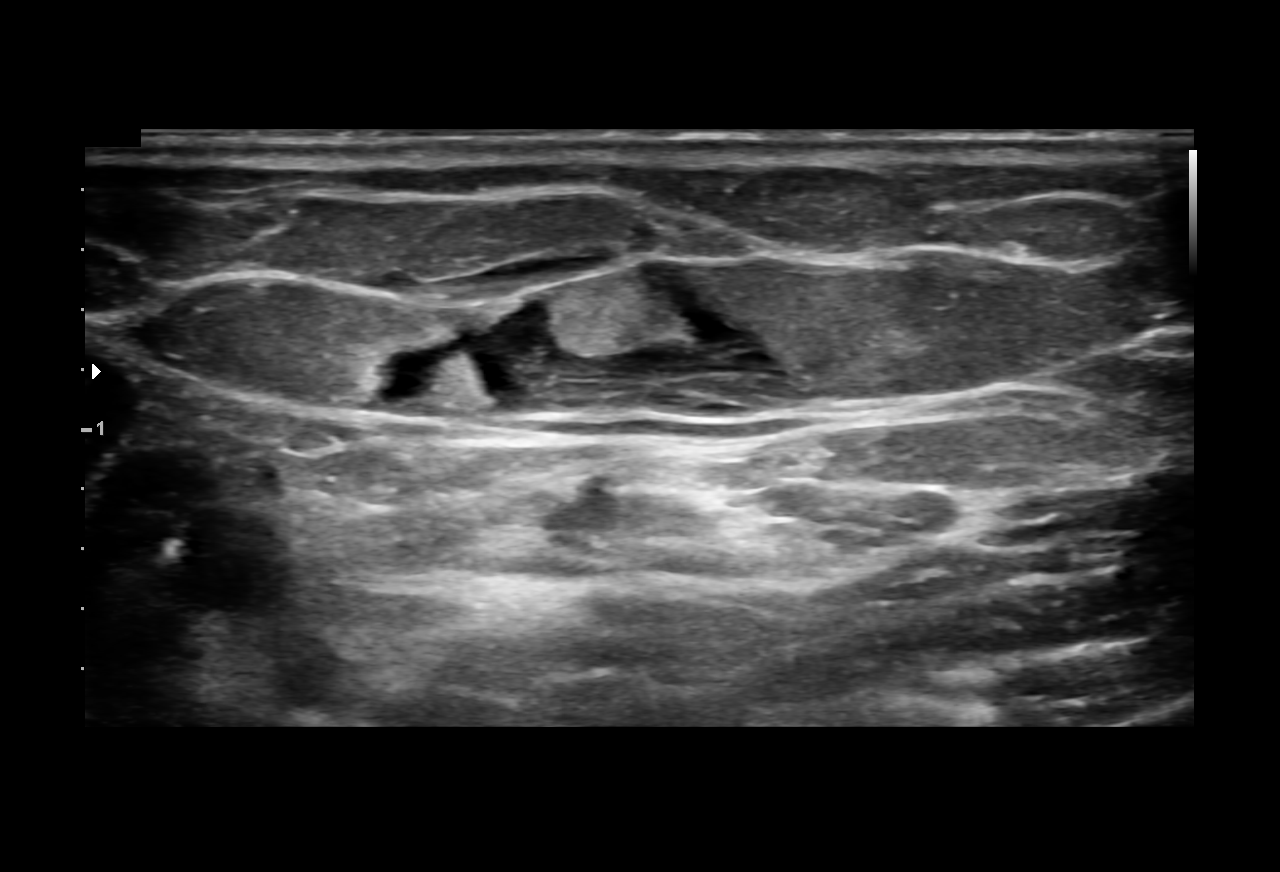

[3 of 3 positions shown; findings below may reference images not displayed]

The right upper extremity was prepped with chlorhexidine in a
sterile fashion, and a sterile drape was applied covering the
operative field. Maximum barrier sterile technique with sterile
gowns and gloves were used for the procedure. A timeout was
performed prior to the initiation of the procedure. Local anesthesia
was provided with 1% lidocaine.

Under direct ultrasound guidance, the right basilic vein was
accessed with a micropuncture kit after the overlying soft tissues
were anesthetized with 1% lidocaine. An ultrasound image was saved
for documentation purposes. A guidewire was advanced to the level of
the superior caval-atrial junction for measurement purposes and the
PICC line was cut to length. A peel-away sheath was placed and a 37
cm, 5 French, single lumen was inserted to level of the superior
caval-atrial junction. A post procedure spot fluoroscopic was
obtained. The catheter easily aspirated and flushed and was sutured
in place. A dressing was placed. The patient tolerated the procedure
well without immediate post procedural complication.
FINDINGS: After catheter placement, the tip lies within the superior
cavoatrial junction. The catheter aspirates and flushes normally and
is ready for immediate use.
IMPRESSION: Successful ultrasound and fluoroscopic guided placement of a right
basilic vein approach, 37 cm, 5 French, single lumen PICC with tip
at the superior caval-atrial junction. The PICC line is ready for
immediate use.

## 2019-07-09 DIAGNOSIS — F17210 Nicotine dependence, cigarettes, uncomplicated: Secondary | ICD-10-CM | POA: Diagnosis not present

## 2019-07-09 DIAGNOSIS — Z23 Encounter for immunization: Secondary | ICD-10-CM | POA: Diagnosis not present

## 2019-07-09 DIAGNOSIS — F17200 Nicotine dependence, unspecified, uncomplicated: Secondary | ICD-10-CM | POA: Diagnosis not present

## 2019-07-09 DIAGNOSIS — J479 Bronchiectasis, uncomplicated: Secondary | ICD-10-CM | POA: Diagnosis not present

## 2019-07-26 DIAGNOSIS — Z7901 Long term (current) use of anticoagulants: Secondary | ICD-10-CM | POA: Diagnosis not present

## 2019-07-26 DIAGNOSIS — Z8619 Personal history of other infectious and parasitic diseases: Secondary | ICD-10-CM | POA: Diagnosis not present

## 2019-07-26 DIAGNOSIS — E119 Type 2 diabetes mellitus without complications: Secondary | ICD-10-CM | POA: Diagnosis not present

## 2019-07-26 DIAGNOSIS — Z9481 Bone marrow transplant status: Secondary | ICD-10-CM | POA: Diagnosis not present

## 2019-07-26 DIAGNOSIS — J479 Bronchiectasis, uncomplicated: Secondary | ICD-10-CM | POA: Diagnosis not present

## 2019-07-26 DIAGNOSIS — G4700 Insomnia, unspecified: Secondary | ICD-10-CM | POA: Diagnosis not present

## 2019-07-26 DIAGNOSIS — C9201 Acute myeloblastic leukemia, in remission: Secondary | ICD-10-CM | POA: Diagnosis not present

## 2019-07-26 DIAGNOSIS — Z9484 Stem cells transplant status: Secondary | ICD-10-CM | POA: Diagnosis not present

## 2019-07-26 DIAGNOSIS — F17210 Nicotine dependence, cigarettes, uncomplicated: Secondary | ICD-10-CM | POA: Diagnosis not present

## 2019-07-26 DIAGNOSIS — Z9221 Personal history of antineoplastic chemotherapy: Secondary | ICD-10-CM | POA: Diagnosis not present

## 2019-07-26 DIAGNOSIS — Z7984 Long term (current) use of oral hypoglycemic drugs: Secondary | ICD-10-CM | POA: Diagnosis not present

## 2019-07-26 DIAGNOSIS — Z86718 Personal history of other venous thrombosis and embolism: Secondary | ICD-10-CM | POA: Diagnosis not present

## 2019-07-30 DIAGNOSIS — C9201 Acute myeloblastic leukemia, in remission: Secondary | ICD-10-CM | POA: Diagnosis not present

## 2019-07-30 DIAGNOSIS — Z9481 Bone marrow transplant status: Secondary | ICD-10-CM | POA: Diagnosis not present

## 2020-02-25 ENCOUNTER — Other Ambulatory Visit: Payer: Self-pay | Admitting: *Deleted

## 2020-02-25 NOTE — Patient Outreach (Signed)
[facility]) Care Management  02/25/2020  Jessica Vega 12-12-1960 [id number]   Telephone Assessment-Unsuccessful  RN attempted outreach call however unsuccessful. RN able to leave a HIPAA approved voice message requesting a call back.  Plan: Will send outreach letter and attempt another outreach call over the next week.  Raina Mina, RN Care Management Coordinator [place] Office [phone]

## 2020-02-28 ENCOUNTER — Other Ambulatory Visit: Payer: Self-pay | Admitting: *Deleted

## 2020-02-28 NOTE — Patient Outreach (Signed)
[facility]) Care Management  02/28/2020  MARQUEISHA KIERCE 1961/03/27 [id number]  CALL-A-NURSE hotline call back Telephone Assessment  Pt utilized the [facility] hotline and inquire on possible resources for diabetic classes. RN spoke with pt today and inquired further on her needs. Pt indicated she has her medical condition under control just seeking additional resources if needed in the future. States she has VA benefits however these resources are in [place] (to far away) and she needed more local resources when needed. RN further explained [facility] services if needed in the future as pt has both MCR/MCD for coverage. Pt very grateful for the call today with no additional request or concerns. Provided [employer] [employer] [facility] [employer] contact number and local gym for possible inquires on Silver Sneakers if the gym of choice offers this program. Also educated on nutritionist or dietitian if this is something she needs to manage. No other request at this time.  Pt aware to call [facility] with no needed resources or medical issues that she feels she needed assistance in managing. No further needs at this time. Will alert pt's primary care provider of pt's disposition with [facility] services.  Raina Mina, RN Care Management Coordinator [place] Office [phone]

## 2020-05-20 DIAGNOSIS — Z1231 Encounter for screening mammogram for malignant neoplasm of breast: Secondary | ICD-10-CM | POA: Diagnosis not present

## 2022-03-01 ENCOUNTER — Other Ambulatory Visit: Payer: Self-pay | Admitting: Urology

## 2022-03-03 NOTE — Progress Notes (Addendum)
Anesthesia Review:  PCP: sheila Tapp  Cardiologist : none  Chest x-ray : EKG : 03/07/22  Echo : Stress test: Cardiac Cath :  Activity level: can do a flight of stairs without difficulty  Sleep Study/ CPAP : none  Fasting Blood Sugar :      / Checks Blood Sugar -- times a day:   Blood Thinner/ Instructions /Last Dose: ASA / Instructions/ Last Dose :   Xarelto - preop instructions on chart  Last dose 03/07/2022 per pt  PT was 28 minutes late for preop appt.   DM- tpe 2  Check glucose on occas at home  Hgba1c-03/07/22- 6.4

## 2022-03-04 NOTE — Progress Notes (Signed)
DUE TO COVID-19 ONLY ONE VISITOR IS ALLOWED TO COME WITH YOU AND STAY IN THE WAITING ROOM ONLY DURING PRE OP AND PROCEDURE DAY OF SURGERY.  2 VISITOR  MAY VISIT WITH YOU AFTER SURGERY IN YOUR PRIVATE ROOM DURING VISITING HOURS ONLY! YOU MAY HAVE ONE PERSON SPEND THE NITE WITH YOU IN YOUR ROOM AFTER SURGERY.      Your procedure is scheduled on:         03/11/2022   Report to [facility] Main  Entrance   Report to admitting at     0515             AM DO NOT [place], PICTURE ID OR WALLET DAY OF SURGERY.      Call this number if you have problems the morning of surgery [phone]    REMEMBER: NO  SOLID FOODS , CANDY, GUM OR MINTS AFTER [place] .       Marland Kitchen CLEAR LIQUIDS UNTIL     0430am            DAY OF SURGERY.        CLEAR LIQUID DIET   Foods Allowed      WATER BLACK COFFEE ( SUGAR OK, NO MILK, CREAM OR CREAMER) REGULAR AND DECAF  TEA ( SUGAR [place] NO MILK, CREAM, OR CREAMER) REGULAR AND DECAF  PLAIN JELLO ( NO RED)  FRUIT ICES ( NO RED, NO FRUIT PULP)  POPSICLES ( NO RED)  JUICE- APPLE, WHITE GRAPE AND WHITE CRANBERRY  SPORT DRINK LIKE GATORADE ( NO RED)  CLEAR BROTH ( VEGETABLE , CHICKEN OR BEEF)                                                                     BRUSH YOUR TEETH MORNING OF SURGERY AND RINSE YOUR MOUTH OUT, NO CHEWING GUM CANDY OR MINTS.     Take these medicines the morning of surgery with A SIP OF WATER:  inhalers as usual and bring, amlodipne, eye drops as usual,  omeprazole if needed    DO NOT TAKE ANY DIABETIC MEDICATIONS DAY OF YOUR SURGERY                               You may not have any metal on your body including hair pins and              piercings  Do not wear jewelry, make-up, lotions, powders or perfumes, deodorant             Do not wear nail polish on your fingernails.              IF YOU ARE A FEMALE AND WANT TO SHAVE UNDER ARMS OR LEGS PRIOR TO SURGERY YOU MUST DO SO AT LEAST 48 HOURS PRIOR TO  SURGERY.              Men may shave face and neck.   Do not bring valuables to the hospital. [place].  Contacts, dentures or bridgework may not be worn into surgery.  Leave suitcase in the car. After surgery it may be brought to your room.     Patients discharged the day of surgery will not be allowed to drive home. IF YOU ARE HAVING SURGERY AND GOING HOME THE SAME DAY, YOU MUST HAVE AN ADULT TO DRIVE YOU HOME AND BE WITH YOU FOR 24 HOURS. YOU MAY GO HOME BY TAXI OR UBER OR ORTHERWISE, BUT AN ADULT MUST ACCOMPANY YOU HOME AND STAY WITH YOU FOR 24 HOURS.                Please read over the following fact sheets you were given: _____________________________________________________________________  [facility] - Preparing for Surgery Before surgery, you can play an important role.  Because skin is not sterile, your skin needs to be as free of germs as possible.  You can reduce the number of germs on your skin by washing with CHG (chlorahexidine gluconate) soap before surgery.  CHG is an antiseptic cleaner which kills germs and bonds with the skin to continue killing germs even after washing. Please DO NOT use if you have an allergy to CHG or antibacterial soaps.  If your skin becomes reddened/irritated stop using the CHG and inform your nurse when you arrive at Short Stay. Do not shave (including legs and underarms) for at least 48 hours prior to the first CHG shower.  You may shave your face/neck. Please follow these instructions carefully:  1.  Shower with CHG Soap the night before surgery and the  morning of Surgery.  2.  If you choose to wash your hair, wash your hair first as usual with your  normal  shampoo.  3.  After you shampoo, rinse your hair and body thoroughly to remove the  shampoo.                           4.  Use CHG as you would any other liquid soap.  You can apply chg directly  to the skin and wash                       Gently  with a scrungie or clean washcloth.  5.  Apply the CHG Soap to your body ONLY FROM THE NECK DOWN.   Do not use on face/ open                           Wound or open sores. Avoid contact with eyes, ears mouth and genitals (private parts).                       Wash face,  Genitals (private parts) with your normal soap.             6.  Wash thoroughly, paying special attention to the area where your surgery  will be performed.  7.  Thoroughly rinse your body with warm water from the neck down.  8.  DO NOT shower/wash with your normal soap after using and rinsing off  the CHG Soap.                9.  Pat yourself dry with a clean towel.            10.  Wear clean pajamas.            11.  Place clean sheets on your bed the night of your first  shower and do not  sleep with pets. Day of Surgery : Do not apply any lotions/deodorants the morning of surgery.  Please wear clean clothes to the hospital/surgery center.  FAILURE TO FOLLOW THESE INSTRUCTIONS MAY RESULT IN THE CANCELLATION OF YOUR SURGERY PATIENT SIGNATURE_________________________________  NURSE SIGNATURE__________________________________  ________________________________________________________________________

## 2022-03-07 ENCOUNTER — Other Ambulatory Visit: Payer: Self-pay

## 2022-03-07 ENCOUNTER — Encounter (INDEPENDENT_AMBULATORY_CARE_PROVIDER_SITE_OTHER): Payer: Self-pay

## 2022-03-07 ENCOUNTER — Encounter (HOSPITAL_COMMUNITY)
Admission: RE | Admit: 2022-03-07 | Discharge: 2022-03-07 | Disposition: A | Discharge status: Home / Self Care | Payer: No Typology Code available for payment source | Source: Ambulatory Visit | Attending: Urology | Admitting: Urology

## 2022-03-07 ENCOUNTER — Encounter (HOSPITAL_COMMUNITY): Payer: Self-pay

## 2022-03-07 VITALS — BP 119/75 | HR 75 | Temp 98.1°F | Resp 16 | Ht 65.0 in | Wt 185.0 lb

## 2022-03-07 DIAGNOSIS — Z01818 Encounter for other preprocedural examination: Secondary | ICD-10-CM | POA: Diagnosis not present

## 2022-03-07 HISTORY — DX: Personal history of other venous thrombosis and embolism: Z86.718

## 2022-03-07 HISTORY — DX: Anxiety disorder, unspecified: F41.9

## 2022-03-07 HISTORY — DX: Fibromyalgia: M79.7

## 2022-03-07 HISTORY — DX: Depression, unspecified: F32.A

## 2022-03-07 HISTORY — DX: Type 2 diabetes mellitus without complications: E11.9

## 2022-03-07 HISTORY — DX: Personal history of urinary calculi: Z87.442

## 2022-03-07 HISTORY — DX: Gastro-esophageal reflux disease without esophagitis: K21.9

## 2022-03-07 HISTORY — DX: Headache, unspecified: R51.9

## 2022-03-07 HISTORY — DX: Pneumonia, unspecified organism: J18.9

## 2022-03-07 LAB — CBC
HCT: 44.9 % (ref 36.0–46.0)
Hemoglobin: 15.1 g/dL — ABNORMAL HIGH (ref 12.0–15.0)
MCH: 31.3 pg (ref 26.0–34.0)
MCHC: 33.6 g/dL (ref 30.0–36.0)
MCV: 93.2 fL (ref 80.0–100.0)
Platelets: 252 10*3/uL (ref 150–400)
RBC: 4.82 MIL/uL (ref 3.87–5.11)
RDW: 12.9 % (ref 11.5–15.5)
WBC: 10.7 10*3/uL — ABNORMAL HIGH (ref 4.0–10.5)
nRBC: 0 % (ref 0.0–0.2)

## 2022-03-07 LAB — HEMOGLOBIN A1C
Hgb A1c MFr Bld: 6.4 % — ABNORMAL HIGH (ref 4.8–5.6)
Mean Plasma Glucose: 136.98 mg/dL

## 2022-03-07 LAB — GLUCOSE, CAPILLARY: Glucose-Capillary: 89 mg/dL (ref 70–99)

## 2022-03-07 LAB — BASIC METABOLIC PANEL
Anion gap: 8 (ref 5–15)
BUN: 15 mg/dL (ref 6–20)
CO2: 27 mmol/L (ref 22–32)
Calcium: 9.7 mg/dL (ref 8.9–10.3)
Chloride: 109 mmol/L (ref 98–111)
Creatinine, Ser: 0.76 mg/dL (ref 0.44–1.00)
GFR, Estimated: >60 mL/min (ref >60)
Glucose, Bld: 100 mg/dL — ABNORMAL HIGH (ref 70–99)
Potassium: 3.4 mmol/L — ABNORMAL LOW (ref 3.5–5.1)
Sodium: 144 mmol/L (ref 135–145)

## 2022-03-10 NOTE — Anesthesia Preprocedure Evaluation (Addendum)
Anesthesia Evaluation  Patient identified by MRN, date of birth, ID band Patient awake    Reviewed: Allergy & Precautions, NPO status , Patient's Chart, lab work & pertinent test results  Airway Mallampati: I  TM Distance: >3 FB Neck ROM: Full    Dental no notable dental hx. (+) Teeth Intact, Dental Advisory Given   Pulmonary Current SmokerPatient did not abstain from smoking.,    Pulmonary exam normal breath sounds clear to auscultation       Cardiovascular hypertension, Normal cardiovascular exam Rhythm:Regular Rate:Normal  6-5- 23 EKG SR w LVH   Neuro/Psych negative neurological ROS     GI/Hepatic GERD  Medicated,  Endo/Other  diabetes  Renal/GU L Ureteral Stone  Lab Results      Component                Value               Date                      CREATININE               0.76                03/07/2022                 K                        3.4 (L)             03/07/2022                    Musculoskeletal  (+) Fibromyalgia -  Abdominal (+) + obese (BMI 30.8),   Peds  Hematology Lab Results      Component                Value               Date                     HGB                      15.1 (H)            03/07/2022                HCT                      44.9                03/07/2022                PLT                      252                 03/07/2022              Anesthesia Other Findings LZJ:QBHAL chlorhexidine, venafaxine  Reproductive/Obstetrics                            Anesthesia Physical Anesthesia Plan  ASA: 3  Anesthesia Plan: General   Post-op Pain Management:    Induction: Intravenous  PONV Risk Score and Plan: 3 and Treatment may vary due to age or medical condition, Ondansetron and Midazolam  Airway Management Planned: LMA  Additional Equipment: None  Intra-op Plan:   Post-operative Plan:   Informed Consent: I have reviewed the patients History  and Physical, chart, labs and discussed the procedure including the risks, benefits and alternatives for the proposed anesthesia with the patient or authorized representative who has indicated his/her understanding and acceptance.     Dental advisory given  Plan Discussed with: CRNA and Anesthesiologist  Anesthesia Plan Comments: (L Ureteral Stone)       Anesthesia Quick Evaluation

## 2022-03-10 NOTE — H&P (Signed)
I have blood in my urine.     Jessica Vega is a 61 yo female who presents for gross hematuria that started on 12/20/21. She was seen at Urgent care and had a UA and then she went to the [place]. She had a CT and had several punctate right renal stones and a 67m non-obstructing left ureteral stone. She has some intermittent left flank and LLQ pain. She has pain daily. The hematuria is intermittent. She has chronic SUI and frequency. She has had nausea and vomiting with the pain but she also has reflux. She is under a lot of stress with family demands. She is a smoker. She is a retired, dDealer She has had prior stones and had ESWL in 1998. She had leukemia in 2013 and had a BMT in 2015. She is on Xarelto for a DVT in her neck and has been on that for about 7 years.     ALLERGIES: Contrast Dye Latex    MEDICATIONS: Amlodipine Besilate  Atorvastatin Calcium  Citracal + D Maximum  Fish Oil  Magnesium  Menopause Support  Metformin Er Gastric  Potassium  Vitamin C  Vitamin D3  Xarelto  Zinc     GU PSH: None     PSH Notes: Lithotrispy- 1998, tubal ligation, bone marrow transplant   NON-GU PSH: Bilateral Tubal Ligation Carpal tunnel surgery Cholecystectomy (open) - 1983 Hysterectomy - 1996     GU PMH: None   NON-GU PMH: Anxiety Depression Diabetes Type 2 GERD Hypercholesterolemia Hypertension Leukemia, History    FAMILY HISTORY: Cancer - Runs in Family Heart Attack - Father High Blood Pressure - Runs in Family Kidney Cancer - Runs in Family Kidney Failure - Runs in Family Kidney Stones - Runs in Family stroke - Mother   SOCIAL HISTORY: Marital Status: Divorced Preferred Language: English; Ethnicity: Not Hispanic Or Latino; Race: Black or African American Current Smoking Status: Patient smokes. Has smoked since 02/01/1987. Smokes 1 pack per day.   Tobacco Use Assessment Completed: Used Tobacco in last 30 days? Does not use smokeless tobacco. Does not use drugs. Drinks 2  caffeinated drinks per day. Patient's occupation is/was disabled..[employer]   REVIEW OF SYSTEMS:    GU Review Female:   Patient reports frequent urination, hard to postpone urination, burning /pain with urination, get up at night to urinate, and leakage of urine. Patient denies stream starts and stops, trouble starting your stream, have to strain to urinate, and being pregnant.  Gastrointestinal (Upper):   Patient reports nausea and vomiting. Patient denies indigestion/ heartburn.  Gastrointestinal (Lower):   Patient reports diarrhea. Patient denies constipation.  Constitutional:   Patient reports night sweats and fatigue. Patient denies fever and weight loss.  Skin:   Patient denies skin rash/ lesion and itching.  Eyes:   Patient reports blurred vision. Patient denies double vision.  Ears/ Nose/ Throat:   Patient reports sore throat and sinus problems.   Hematologic/Lymphatic:   Patient denies easy bruising and swollen glands.  Cardiovascular:   Patient denies leg swelling and chest pains.  Respiratory:   Patient reports cough and shortness of breath.   Endocrine:   Patient reports excessive thirst.   Musculoskeletal:   Patient denies back pain and joint pain.  Neurological:   Patient reports headaches and dizziness.   Psychologic:   Patient reports depression and anxiety.    Notes: Blood in urine    VITAL SIGNS:      02/08/2022 09:52 AM  Weight 165 lb / 74.84  kg  Height 62 in / 157.48 cm  BP 123/82 mmHg  Heart Rate 83 /min  Temperature 98.0 F / 36.6 C  BMI 30.2 kg/m   MULTI-SYSTEM PHYSICAL EXAMINATION:    Constitutional: Obese. No physical deformities. Normally developed. Good grooming.   Neck: Neck symmetrical, not swollen. Normal tracheal position.  Respiratory: Normal breath sounds. No labored breathing, no use of accessory muscles.   Cardiovascular: Regular rate and rhythm. No murmur, no gallop.  Skin: No paleness, no jaundice, no cyanosis. No lesion, no ulcer, no rash.   Neurologic / Psychiatric: Oriented to time, oriented to place, oriented to person. No depression, no anxiety, no agitation.  Gastrointestinal: Abdominal tenderness, mild LLQ, obese. No mass, no rigidity.   Musculoskeletal: Normal gait and station of head and neck.     Complexity of Data:  Lab Test Review:   BUN/Creatinine  Urine Test Review:   Urinalysis  X-Ray Review: KUB: Reviewed Films. Discussed With Patient.  C.T. Stone Protocol: Reviewed Report. Discussed With Patient.    Notes:                     Cr 1.0 on 01/17/22.    PROCEDURES:         KUB - [id number]  A single view of the abdomen is obtained. There is a 3x29m calcification at the lower edge of the L3 transverse process that is consistent with the reported stone. I don't see any stones in the right upper quadrant. There is a phlebolith in the right pelvis. No bone, gas or soft tissue abnormalities are noted.       Patient confirmed No Neulasta OnPro Device.           Urinalysis w/Scope Dipstick Dipstick Cont'd Micro  Color: Yellow Bilirubin: Neg mg/dL WBC/hpf: 0 - 5/hpf  Appearance: Clear Ketones: Neg mg/dL RBC/hpf: NS (Not Seen)  Specific Gravity: 1.015 Blood: Neg ery/uL Bacteria: NS (Not Seen)  pH: 8.5 Protein: 2+ mg/dL Cystals: NS (Not Seen)  Glucose: Neg mg/dL Urobilinogen: 1.0 mg/dL Casts: NS (Not Seen)    Nitrites: Neg Trichomonas: Not Present    Leukocyte Esterase: Trace leu/uL Mucous: Present      Epithelial Cells: 0 - 5/hpf      Yeast: NS (Not Seen)      Sperm: Not Present    Notes: QNS for spun micro    ASSESSMENT:      ICD-10 Details  1 GU:   Gross hematuria - RE42.3Acute, Uncomplicated - She has intermittent gross hematuria with a 467mstone in the left proximal ureter that was not obstructing. She has minimal pain but has had symptoms for 2 months. I am going to get her started on tamsulosin and reviewed the side effects but will go ahead and get her scheduled for cystoscopy with left ureteroscopy. I have  reviewed the risks of ureteroscopy including bleeding, infection, ureteral injury, need for a stent or secondary procedures, thrombotic events and anesthetic complications.    2   Ureteral calculus - N20.1 Left, Acute, Systemic Symptoms  3   Renal calculus - N20.0 Right, Minor - She has punctate right renal stones that need no treatment.    PLAN:            Medications New Meds: Tamsulosin Hcl 0.4 mg capsule 1 capsule PO Daily   #30  1 Refill(s)  [facility]  [id number]  [id number] [id number]:  [phone]  Fax:  [phone]            Orders X-Rays: KUB          Schedule Return Visit/Planned Activity: ASAP - Schedule Surgery  Procedure: Unspecified Date - Cysto Uretero Lithotripsy - [phone], left Notes: next available          Document

## 2022-03-11 ENCOUNTER — Ambulatory Visit (HOSPITAL_COMMUNITY): Payer: No Typology Code available for payment source | Admitting: Physician Assistant

## 2022-03-11 ENCOUNTER — Ambulatory Visit (HOSPITAL_BASED_OUTPATIENT_CLINIC_OR_DEPARTMENT_OTHER): Payer: No Typology Code available for payment source | Admitting: Anesthesiology

## 2022-03-11 ENCOUNTER — Encounter (HOSPITAL_COMMUNITY): Payer: Self-pay | Admitting: Urology

## 2022-03-11 ENCOUNTER — Ambulatory Visit (HOSPITAL_COMMUNITY): Payer: No Typology Code available for payment source

## 2022-03-11 ENCOUNTER — Ambulatory Visit (HOSPITAL_COMMUNITY)
Admission: RE | Admit: 2022-03-11 | Discharge: 2022-03-11 | Disposition: A | Discharge status: Home / Self Care | Payer: No Typology Code available for payment source | Attending: Urology | Admitting: Urology

## 2022-03-11 ENCOUNTER — Encounter (HOSPITAL_COMMUNITY)
Admission: RE | Disposition: A | Discharge status: Home / Self Care | Payer: Self-pay | Source: Home / Self Care | Attending: Urology

## 2022-03-11 DIAGNOSIS — N201 Calculus of ureter: Secondary | ICD-10-CM | POA: Diagnosis present

## 2022-03-11 DIAGNOSIS — M797 Fibromyalgia: Secondary | ICD-10-CM | POA: Diagnosis not present

## 2022-03-11 DIAGNOSIS — K219 Gastro-esophageal reflux disease without esophagitis: Secondary | ICD-10-CM | POA: Diagnosis not present

## 2022-03-11 DIAGNOSIS — E669 Obesity, unspecified: Secondary | ICD-10-CM

## 2022-03-11 DIAGNOSIS — E119 Type 2 diabetes mellitus without complications: Secondary | ICD-10-CM | POA: Diagnosis not present

## 2022-03-11 DIAGNOSIS — Z01818 Encounter for other preprocedural examination: Secondary | ICD-10-CM

## 2022-03-11 DIAGNOSIS — Z86718 Personal history of other venous thrombosis and embolism: Secondary | ICD-10-CM | POA: Insufficient documentation

## 2022-03-11 DIAGNOSIS — F17211 Nicotine dependence, cigarettes, in remission: Secondary | ICD-10-CM

## 2022-03-11 DIAGNOSIS — F17210 Nicotine dependence, cigarettes, uncomplicated: Secondary | ICD-10-CM | POA: Insufficient documentation

## 2022-03-11 DIAGNOSIS — Z7984 Long term (current) use of oral hypoglycemic drugs: Secondary | ICD-10-CM

## 2022-03-11 DIAGNOSIS — I10 Essential (primary) hypertension: Secondary | ICD-10-CM | POA: Insufficient documentation

## 2022-03-11 DIAGNOSIS — Z6830 Body mass index (BMI) 30.0-30.9, adult: Secondary | ICD-10-CM | POA: Diagnosis not present

## 2022-03-11 DIAGNOSIS — Z79899 Other long term (current) drug therapy: Secondary | ICD-10-CM | POA: Insufficient documentation

## 2022-03-11 DIAGNOSIS — Z7901 Long term (current) use of anticoagulants: Secondary | ICD-10-CM | POA: Diagnosis not present

## 2022-03-11 HISTORY — PX: CYSTOSCOPY WITH RETROGRADE PYELOGRAM, URETEROSCOPY AND STENT PLACEMENT: SHX5789

## 2022-03-11 LAB — GLUCOSE, CAPILLARY
Glucose-Capillary: 104 mg/dL — ABNORMAL HIGH (ref 70–99)
Glucose-Capillary: 109 mg/dL — ABNORMAL HIGH (ref 70–99)

## 2022-03-11 SURGERY — CYSTOURETEROSCOPY, WITH RETROGRADE PYELOGRAM AND STENT INSERTION
Anesthesia: General | Laterality: Left

## 2022-03-11 MED ORDER — CHLORHEXIDINE GLUCONATE 0.12 % MT SOLN: 15.0000 mL | Freq: Once | OROMUCOSAL | Status: AC

## 2022-03-11 MED ORDER — DEXMEDETOMIDINE (PRECEDEX) IN NS 20 MCG/5ML (4 MCG/ML) IV SYRINGE
PREFILLED_SYRINGE | INTRAVENOUS | Status: AC
  Filled 2022-03-11: qty 5

## 2022-03-11 MED ORDER — FENTANYL CITRATE (PF) 100 MCG/2ML IJ SOLN
INTRAMUSCULAR | Status: DC | PRN
Start: 2022-03-11 — End: 2022-03-11
  Administered 2022-03-11: 25 ug via INTRAVENOUS

## 2022-03-11 MED ORDER — ONDANSETRON HCL 4 MG/2ML IJ SOLN
4.0000 mg | Freq: Once | INTRAMUSCULAR | Status: DC | PRN
Start: 2022-03-11 — End: 2022-03-11

## 2022-03-11 MED ORDER — PHENYLEPHRINE 80 MCG/ML (10ML) SYRINGE FOR IV PUSH (FOR BLOOD PRESSURE SUPPORT)
PREFILLED_SYRINGE | INTRAVENOUS | Status: DC | PRN
  Administered 2022-03-11 (×3): 80 ug via INTRAVENOUS

## 2022-03-11 MED ORDER — LIDOCAINE 2% (20 MG/ML) 5 ML SYRINGE
INTRAMUSCULAR | Status: DC | PRN
  Administered 2022-03-11: 100 mg via INTRAVENOUS

## 2022-03-11 MED ORDER — PHENYLEPHRINE 80 MCG/ML (10ML) SYRINGE FOR IV PUSH (FOR BLOOD PRESSURE SUPPORT)
PREFILLED_SYRINGE | INTRAVENOUS | Status: AC
  Filled 2022-03-11: qty 10

## 2022-03-11 MED ORDER — HYDROCODONE-ACETAMINOPHEN 5-325 MG PO TABS: 1.0000 | ORAL_TABLET | ORAL | 0 refills | Status: DC | PRN

## 2022-03-11 MED ORDER — ACETAMINOPHEN 325 MG PO TABS: 650.0000 mg | ORAL_TABLET | ORAL | Status: DC | PRN

## 2022-03-11 MED ORDER — SODIUM CHLORIDE 0.9% FLUSH: 3.0000 mL | Freq: Two times a day (BID) | INTRAVENOUS | Status: DC

## 2022-03-11 MED ORDER — HYDROMORPHONE HCL 1 MG/ML IJ SOLN: 0.2500 mg | INTRAMUSCULAR | Status: DC | PRN

## 2022-03-11 MED ORDER — PROPOFOL 10 MG/ML IV BOLUS
INTRAVENOUS | Status: AC
  Filled 2022-03-11: qty 20

## 2022-03-11 MED ORDER — CEFAZOLIN SODIUM-DEXTROSE 2-4 GM/100ML-% IV SOLN
2.0000 g | INTRAVENOUS | Status: AC
  Administered 2022-03-11: 2 g via INTRAVENOUS
  Filled 2022-03-11: qty 100

## 2022-03-11 MED ORDER — SODIUM CHLORIDE 0.9 % IR SOLN
Status: DC | PRN
  Administered 2022-03-11: 3000 mL via INTRAVESICAL

## 2022-03-11 MED ORDER — LIDOCAINE HCL (PF) 2 % IJ SOLN
INTRAMUSCULAR | Status: AC
  Filled 2022-03-11: qty 5

## 2022-03-11 MED ORDER — AMISULPRIDE (ANTIEMETIC) 5 MG/2ML IV SOLN
10.0000 mg | Freq: Once | INTRAVENOUS | Status: DC | PRN
Start: 2022-03-11 — End: 2022-03-11

## 2022-03-11 MED ORDER — MIDAZOLAM HCL 5 MG/5ML IJ SOLN
INTRAMUSCULAR | Status: DC | PRN
  Administered 2022-03-11: 2 mg via INTRAVENOUS

## 2022-03-11 MED ORDER — ACETAMINOPHEN 10 MG/ML IV SOLN: 1000.0000 mg | Freq: Once | INTRAVENOUS | Status: DC | PRN

## 2022-03-11 MED ORDER — KETOROLAC TROMETHAMINE 30 MG/ML IJ SOLN
INTRAMUSCULAR | Status: DC | PRN
  Administered 2022-03-11: 30 mg via INTRAVENOUS

## 2022-03-11 MED ORDER — DEXMEDETOMIDINE (PRECEDEX) IN NS 20 MCG/5ML (4 MCG/ML) IV SYRINGE
PREFILLED_SYRINGE | INTRAVENOUS | Status: DC | PRN
  Administered 2022-03-11: 8 ug via INTRAVENOUS

## 2022-03-11 MED ORDER — DEXAMETHASONE SODIUM PHOSPHATE 10 MG/ML IJ SOLN
INTRAMUSCULAR | Status: DC | PRN
  Administered 2022-03-11: 10 mg via INTRAVENOUS

## 2022-03-11 MED ORDER — ONDANSETRON HCL 4 MG/2ML IJ SOLN
INTRAMUSCULAR | Status: DC | PRN
  Administered 2022-03-11: 4 mg via INTRAVENOUS

## 2022-03-11 MED ORDER — IOHEXOL 300 MG/ML  SOLN
INTRAMUSCULAR | Status: DC | PRN
  Administered 2022-03-11: 5 mL

## 2022-03-11 MED ORDER — ORAL CARE MOUTH RINSE
15.0000 mL | Freq: Once | OROMUCOSAL | Status: AC
Start: 2022-03-11 — End: 2022-03-11
  Administered 2022-03-11: 15 mL via OROMUCOSAL

## 2022-03-11 MED ORDER — KETOROLAC TROMETHAMINE 30 MG/ML IJ SOLN
INTRAMUSCULAR | Status: AC
  Filled 2022-03-11: qty 1

## 2022-03-11 MED ORDER — DEXAMETHASONE SODIUM PHOSPHATE 10 MG/ML IJ SOLN
INTRAMUSCULAR | Status: AC
  Filled 2022-03-11: qty 1

## 2022-03-11 MED ORDER — OXYCODONE HCL 5 MG/5ML PO SOLN: 5.0000 mg | Freq: Once | ORAL | Status: DC | PRN

## 2022-03-11 MED ORDER — FENTANYL CITRATE (PF) 100 MCG/2ML IJ SOLN
INTRAMUSCULAR | Status: AC
  Filled 2022-03-11: qty 2

## 2022-03-11 MED ORDER — ACETAMINOPHEN 650 MG RE SUPP: 650.0000 mg | RECTAL | Status: DC | PRN

## 2022-03-11 MED ORDER — SODIUM CHLORIDE 0.9 % IV SOLN: 250.0000 mL | INTRAVENOUS | Status: DC | PRN

## 2022-03-11 MED ORDER — OXYCODONE HCL 5 MG PO TABS: 5.0000 mg | ORAL_TABLET | Freq: Once | ORAL | Status: DC | PRN

## 2022-03-11 MED ORDER — MORPHINE SULFATE (PF) 2 MG/ML IV SOLN: 2.0000 mg | INTRAVENOUS | Status: DC | PRN

## 2022-03-11 MED ORDER — PROPOFOL 10 MG/ML IV BOLUS
INTRAVENOUS | Status: DC | PRN
  Administered 2022-03-11: 130 mg via INTRAVENOUS

## 2022-03-11 MED ORDER — LACTATED RINGERS IV SOLN: INTRAVENOUS | Status: DC

## 2022-03-11 MED ORDER — MIDAZOLAM HCL 2 MG/2ML IJ SOLN
INTRAMUSCULAR | Status: AC
  Filled 2022-03-11: qty 2

## 2022-03-11 MED ORDER — 0.9 % SODIUM CHLORIDE (POUR BTL) OPTIME
TOPICAL | Status: DC | PRN
  Administered 2022-03-11: 1000 mL

## 2022-03-11 MED ORDER — ONDANSETRON HCL 4 MG/2ML IJ SOLN
INTRAMUSCULAR | Status: AC
  Filled 2022-03-11: qty 2

## 2022-03-11 MED ORDER — OXYCODONE HCL 5 MG PO TABS: 5.0000 mg | ORAL_TABLET | ORAL | Status: DC | PRN

## 2022-03-11 MED ORDER — SODIUM CHLORIDE 0.9% FLUSH: 3.0000 mL | INTRAVENOUS | Status: DC | PRN

## 2022-03-11 SURGICAL SUPPLY — 25 items
BAG URO CATCHER STRL LF (MISCELLANEOUS) ×3 IMPLANT
BASKET STONE NCOMPASS (UROLOGICAL SUPPLIES) IMPLANT
CATH URETERAL DUAL LUMEN 10F (MISCELLANEOUS) IMPLANT
CATH URETL OPEN 5X70 (CATHETERS) IMPLANT
CLOTH BEACON ORANGE TIMEOUT ST (SAFETY) ×3 IMPLANT
EXTRACTOR STONE NITINOL NGAGE (UROLOGICAL SUPPLIES) ×1 IMPLANT
GLOVE SURG SS PI 8.0 STRL IVOR (GLOVE) ×3 IMPLANT
GOWN STRL REUS W/ TWL XL LVL3 (GOWN DISPOSABLE) ×2 IMPLANT
GOWN STRL REUS W/TWL XL LVL3 (GOWN DISPOSABLE) ×2
GUIDEWIRE STR DUAL SENSOR (WIRE) ×3 IMPLANT
IV NS IRRIG 3000ML ARTHROMATIC (IV SOLUTION) ×3 IMPLANT
KIT BALLN UROMAX 15FX4 (MISCELLANEOUS) IMPLANT
KIT BALLN UROMAX 26 75X4 (MISCELLANEOUS) ×2
KIT TURNOVER KIT A (KITS) IMPLANT
LASER FIB FLEXIVA PULSE ID 365 (Laser) IMPLANT
LASER FIB FLEXIVA PULSE ID 550 (Laser) IMPLANT
LASER FIB FLEXIVA PULSE ID 910 (Laser) IMPLANT
MANIFOLD NEPTUNE II (INSTRUMENTS) ×3 IMPLANT
PACK CYSTO (CUSTOM PROCEDURE TRAY) ×3 IMPLANT
SHEATH NAVIGATOR HD 11/13X36 (SHEATH) IMPLANT
STENT URET 6FRX24 CONTOUR (STENTS) ×1 IMPLANT
TRACTIP FLEXIVA PULS ID 200XHI (Laser) IMPLANT
TRACTIP FLEXIVA PULSE ID 200 (Laser)
TUBING CONNECTING 10 (TUBING) ×3 IMPLANT
TUBING UROLOGY SET (TUBING) ×3 IMPLANT

## 2022-03-11 NOTE — Transfer of Care (Signed)
Immediate Anesthesia Transfer of Care Note  Patient: Jessica Vega  Procedure(s) Performed: CYSTOSCOPY WITH LEFT RETROGRADE PYELOGRAM, URETEROSCOPY AND STENT PLACEMENT (Left)  Patient Location: PACU  Anesthesia Type:General  Level of Consciousness: awake and alert   Airway & Oxygen Therapy: Patient Spontanous Breathing and Patient connected to face mask oxygen  Post-op Assessment: Report given to RN and Post -op Vital signs reviewed and stable  Post vital signs: Reviewed and stable  Last Vitals:  Vitals Value Taken Time  BP    Temp    Pulse 79 03/11/22 0824  Resp 20 03/11/22 0824  SpO2 97 % 03/11/22 0824  Vitals shown include unvalidated device data.  Last Pain:  Vitals:   03/11/22 0626  TempSrc:   PainSc: 0-No pain         Complications: No notable events documented.

## 2022-03-11 NOTE — Op Note (Signed)
Procedure: 1.  Cystoscopy with left retrograde pyelogram and interpretation. 2.  Left ureteroscopy with stone extraction. 3.  Cystoscopy with insertion of left double-J stent. 4.  Application of fluoroscopy.  Preop diagnosis: Left proximal ureteral stone.  Postop diagnosis: Left distal ureteral stone.  Surgeon: Dr. Irine Seal.  Anesthesia: General.  Specimen: Stone fragments.  Drain: 6 [place] by 24 cm left contour double-J stent with tether.  EBL: None.  Complications: None.  Indications: The patient is a 61 year old female who was seen previously with a 4 mm left proximal ureteral stone and hematuria.  She has not seen the stone pass but is not symptomatic currently.  Procedure: She was taken operating room where she was given antibiotics.  A general anesthetic was induced.  She was placed in lithotomy position and fitted with PAS hose.  Her perineum and genitalia were prepped with Betadine solution she was draped in usual sterile fashion.  Cystoscopy was performed using a 21 [place] scope and 30 degree lens.  Examination revealed a normal urethra.  The bladder wall was smooth and pale without tumors, stones or inflammation.  Ureteral orifices were unremarkable.  The left ureteral orifice was cannulated with 5 [place] open-ended catheter and contrast was instilled.  Left retrograde pyelogram demonstrated a small filling defect in the distal ureter consistent with a stone with no proximal dilation.  A sensor wire was then advanced through the open-ended catheter to the kidney under fluoroscopic guidance.  A 4 cm x 15 French balloon dilation catheter was passed over the wire across the ureteral meatus and intramural ureter.  The balloon was dilated to 20 atm and then deflated.  The cystoscope and balloon were removed leaving the wire in place.  A 6.5 French semirigid ureteroscope was then passed alongside the wire and the stone was visualized within the distal ureter.  It fragmented  with the balloon dilation.  An engage basket was then used to remove the 2 fragments.  A third smaller fragment flushed out.  Ureteroscope was removed and a 6 [place] by 24 cm contour double-J stent with tether was passed to the kidney under fluoroscopic guidance.  The wire was removed leaving good coil in the kidney and a good coil in the bladder.  The stent string was tied close to the meatus, trimmed to an appropriate length and tucked vaginally and a tampon string fashion.  The bladder was drained with the cystoscope sheath.  She was taken down from lithotomy position, her anesthetic was reversed and she was moved to recovery in stable condition.  She will be given her stone fragments to bring to the office at follow-up.  There were no complications.

## 2022-03-11 NOTE — Anesthesia Procedure Notes (Signed)
Procedure Name: LMA Insertion Date/Time: 03/11/2022 7:41 AM  Performed by: Sharlette Dense, CRNAPatient Re-evaluated:Patient Re-evaluated prior to induction Preoxygenation: Pre-oxygenation with 100% oxygen Induction Type: IV induction Ventilation: Mask ventilation without difficulty LMA: LMA with gastric port inserted LMA Size: 4.0 Number of attempts: 1 Placement Confirmation: positive ETCO2 and breath sounds checked- equal and bilateral Tube secured with: Tape Dental Injury: Teeth and Oropharynx as per pre-operative assessment

## 2022-03-11 NOTE — Discharge Instructions (Addendum)
You  may remove the stent on Monday morning by pulling the attached string that is tucked vaginally.  If you don't feel you can do that, please call the office to come in to have it done.   Please bring the stone to the office when you return for f/u.

## 2022-03-11 NOTE — Anesthesia Postprocedure Evaluation (Signed)
Anesthesia Post Note  Patient: ANERI SLAGEL  Procedure(s) Performed: CYSTOSCOPY WITH LEFT RETROGRADE PYELOGRAM, URETEROSCOPY AND STENT PLACEMENT (Left)     Patient location during evaluation: PACU Anesthesia Type: General Level of consciousness: awake and alert Pain management: pain level controlled Vital Signs Assessment: post-procedure vital signs reviewed and stable Respiratory status: spontaneous breathing, nonlabored ventilation, respiratory function stable and patient connected to nasal cannula oxygen Cardiovascular status: blood pressure returned to baseline and stable Postop Assessment: no apparent nausea or vomiting Anesthetic complications: no   No notable events documented.  Last Vitals:  Vitals:   03/11/22 0830 03/11/22 0840  BP: 117/74 129/77  Pulse: 80 73  Resp: (!) 23 16  Temp:  36.4 C  SpO2: 92% 96%    Last Pain:  Vitals:   03/11/22 0840  TempSrc:   PainSc: 0-No pain                 Barnet Glasgow

## 2022-03-11 NOTE — Op Note (Signed)
Lets see if

## 2022-03-11 NOTE — Interval H&P Note (Signed)
History and Physical Interval Note:  She has had no pain or hematuria but has not see a stone pass.   03/11/2022 7:25 AM  Jessica Vega  has presented today for surgery, with the diagnosis of LEFT PROXIMAL STONE.  The various methods of treatment have been discussed with the patient and family. After consideration of risks, benefits and other options for treatment, the patient has consented to  Procedure(s): CYSTOSCOPY WITH LEFT RETROGRADE PYELOGRAM, URETEROSCOPY POSSIBLE HOLMIUM LASER AND STENT PLACEMENT (Left) as a surgical intervention.  The patient's history has been reviewed, patient examined, no change in status, stable for surgery.  I have reviewed the patient's chart and labs.  Questions were answered to the patient's satisfaction.     Irine Seal

## 2022-03-12 ENCOUNTER — Encounter (HOSPITAL_COMMUNITY): Payer: Self-pay | Admitting: Urology

## 2023-08-10 ENCOUNTER — Observation Stay (HOSPITAL_COMMUNITY)
Admission: EM | Admit: 2023-08-10 | Discharge: 2023-08-11 | Disposition: A | Discharge status: Home / Self Care | Payer: Medicare Other | Attending: Emergency Medicine | Admitting: Emergency Medicine

## 2023-08-10 ENCOUNTER — Encounter (HOSPITAL_COMMUNITY): Payer: Self-pay

## 2023-08-10 ENCOUNTER — Other Ambulatory Visit: Payer: Self-pay

## 2023-08-10 ENCOUNTER — Emergency Department (HOSPITAL_COMMUNITY): Payer: Medicare Other

## 2023-08-10 DIAGNOSIS — I10 Essential (primary) hypertension: Secondary | ICD-10-CM | POA: Diagnosis not present

## 2023-08-10 DIAGNOSIS — E785 Hyperlipidemia, unspecified: Secondary | ICD-10-CM

## 2023-08-10 DIAGNOSIS — R042 Hemoptysis: Principal | ICD-10-CM | POA: Insufficient documentation

## 2023-08-10 DIAGNOSIS — Z91040 Latex allergy status: Secondary | ICD-10-CM | POA: Diagnosis not present

## 2023-08-10 DIAGNOSIS — E119 Type 2 diabetes mellitus without complications: Secondary | ICD-10-CM | POA: Insufficient documentation

## 2023-08-10 DIAGNOSIS — F17210 Nicotine dependence, cigarettes, uncomplicated: Secondary | ICD-10-CM | POA: Diagnosis not present

## 2023-08-10 DIAGNOSIS — Z86718 Personal history of other venous thrombosis and embolism: Secondary | ICD-10-CM

## 2023-08-10 DIAGNOSIS — Z7984 Long term (current) use of oral hypoglycemic drugs: Secondary | ICD-10-CM | POA: Diagnosis not present

## 2023-08-10 DIAGNOSIS — Z7901 Long term (current) use of anticoagulants: Secondary | ICD-10-CM | POA: Diagnosis not present

## 2023-08-10 DIAGNOSIS — Z79899 Other long term (current) drug therapy: Secondary | ICD-10-CM | POA: Diagnosis not present

## 2023-08-10 DIAGNOSIS — Z9481 Bone marrow transplant status: Secondary | ICD-10-CM | POA: Insufficient documentation

## 2023-08-10 LAB — CBC WITH DIFFERENTIAL/PLATELET
Abs Immature Granulocytes: 0.02 10*3/uL (ref 0.00–0.07)
Basophils Absolute: 0.1 10*3/uL (ref 0.0–0.1)
Basophils Relative: 1 %
Eosinophils Absolute: 0.1 10*3/uL (ref 0.0–0.5)
Eosinophils Relative: 1 %
HCT: 44.4 % (ref 36.0–46.0)
Hemoglobin: 15.2 g/dL — ABNORMAL HIGH (ref 12.0–15.0)
Immature Granulocytes: 0 %
Lymphocytes Relative: 41 %
Lymphs Abs: 3.8 10*3/uL (ref 0.7–4.0)
MCH: 32.2 pg (ref 26.0–34.0)
MCHC: 34.2 g/dL (ref 30.0–36.0)
MCV: 94.1 fL (ref 80.0–100.0)
Monocytes Absolute: 0.8 10*3/uL (ref 0.1–1.0)
Monocytes Relative: 8 %
Neutro Abs: 4.6 10*3/uL (ref 1.7–7.7)
Neutrophils Relative %: 49 %
Platelets: 264 10*3/uL (ref 150–400)
RBC: 4.72 MIL/uL (ref 3.87–5.11)
RDW: 13.1 % (ref 11.5–15.5)
WBC: 9.4 10*3/uL (ref 4.0–10.5)
nRBC: 0 % (ref 0.0–0.2)

## 2023-08-10 LAB — BASIC METABOLIC PANEL
Anion gap: 8 (ref 5–15)
BUN: 15 mg/dL (ref 8–23)
CO2: 24 mmol/L (ref 22–32)
Calcium: 10.4 mg/dL — ABNORMAL HIGH (ref 8.9–10.3)
Chloride: 104 mmol/L (ref 98–111)
Creatinine, Ser: 0.89 mg/dL (ref 0.44–1.00)
GFR, Estimated: >60 mL/min (ref >60)
Glucose, Bld: 150 mg/dL — ABNORMAL HIGH (ref 70–99)
Potassium: 3.9 mmol/L (ref 3.5–5.1)
Sodium: 136 mmol/L (ref 135–145)

## 2023-08-10 NOTE — ED Triage Notes (Signed)
Patient states she started coughing up blood all of a sudden. Patient feels like her cough is "weird". Patient smoke cigarettes and is use t her normal smokers cough. Patient also states she lives with her mother who likes to turn the heat up. Denies throat soreness. Patient has a history of lung cancer. States she feels heavy in her chest.

## 2023-08-11 ENCOUNTER — Emergency Department (HOSPITAL_COMMUNITY): Payer: Medicare Other

## 2023-08-11 DIAGNOSIS — R042 Hemoptysis: Secondary | ICD-10-CM | POA: Diagnosis not present

## 2023-08-11 DIAGNOSIS — E785 Hyperlipidemia, unspecified: Secondary | ICD-10-CM

## 2023-08-11 DIAGNOSIS — E119 Type 2 diabetes mellitus without complications: Secondary | ICD-10-CM

## 2023-08-11 DIAGNOSIS — Z86718 Personal history of other venous thrombosis and embolism: Secondary | ICD-10-CM

## 2023-08-11 LAB — CBC
HCT: 50.4 % — ABNORMAL HIGH (ref 36.0–46.0)
Hemoglobin: 17 g/dL — ABNORMAL HIGH (ref 12.0–15.0)
MCH: 32.1 pg (ref 26.0–34.0)
MCHC: 33.7 g/dL (ref 30.0–36.0)
MCV: 95.1 fL (ref 80.0–100.0)
Platelets: 284 10*3/uL (ref 150–400)
RBC: 5.3 MIL/uL — ABNORMAL HIGH (ref 3.87–5.11)
RDW: 13.2 % (ref 11.5–15.5)
WBC: 8.8 10*3/uL (ref 4.0–10.5)
nRBC: 0 % (ref 0.0–0.2)

## 2023-08-11 LAB — COMPREHENSIVE METABOLIC PANEL
ALT: 22 U/L (ref 0–44)
AST: 20 U/L (ref 15–41)
Albumin: 4.4 g/dL (ref 3.5–5.0)
Alkaline Phosphatase: 73 U/L (ref 38–126)
Anion gap: 8 (ref 5–15)
BUN: 13 mg/dL (ref 8–23)
CO2: 27 mmol/L (ref 22–32)
Calcium: 10.7 mg/dL — ABNORMAL HIGH (ref 8.9–10.3)
Chloride: 102 mmol/L (ref 98–111)
Creatinine, Ser: 0.75 mg/dL (ref 0.44–1.00)
GFR, Estimated: >60 mL/min (ref >60)
Glucose, Bld: 105 mg/dL — ABNORMAL HIGH (ref 70–99)
Potassium: 3.6 mmol/L (ref 3.5–5.1)
Sodium: 137 mmol/L (ref 135–145)
Total Bilirubin: 0.5 mg/dL (ref <1.2)
Total Protein: 7.7 g/dL (ref 6.5–8.1)

## 2023-08-11 LAB — GLUCOSE, CAPILLARY
Glucose-Capillary: 107 mg/dL — ABNORMAL HIGH (ref 70–99)
Glucose-Capillary: 176 mg/dL — ABNORMAL HIGH (ref 70–99)

## 2023-08-11 LAB — HIV ANTIBODY (ROUTINE TESTING W REFLEX): HIV Screen 4th Generation wRfx: NONREACTIVE

## 2023-08-11 LAB — D-DIMER, QUANTITATIVE: D-Dimer, Quant: <0.27 ug{FEU}/mL (ref 0.00–0.50)

## 2023-08-11 LAB — TROPONIN I (HIGH SENSITIVITY)
Troponin I (High Sensitivity): 6 ng/L (ref <18)
Troponin I (High Sensitivity): 7 ng/L (ref <18)

## 2023-08-11 LAB — HEMOGLOBIN A1C
Hgb A1c MFr Bld: 6 % — ABNORMAL HIGH (ref 4.8–5.6)
Mean Plasma Glucose: 125.5 mg/dL

## 2023-08-11 MED ORDER — ACETAMINOPHEN 650 MG RE SUPP: 650.0000 mg | Freq: Four times a day (QID) | RECTAL | Status: DC | PRN

## 2023-08-11 MED ORDER — ONDANSETRON HCL 4 MG/2ML IJ SOLN
4.0000 mg | Freq: Once | INTRAMUSCULAR | Status: AC
  Administered 2023-08-11: 4 mg via INTRAVENOUS
  Filled 2023-08-11: qty 2

## 2023-08-11 MED ORDER — IOHEXOL 350 MG/ML SOLN
80.0000 mL | Freq: Once | INTRAVENOUS | Status: AC | PRN
  Administered 2023-08-11: 80 mL via INTRAVENOUS

## 2023-08-11 MED ORDER — NICOTINE 21 MG/24HR TD PT24
21.0000 mg | MEDICATED_PATCH | Freq: Every day | TRANSDERMAL | Status: DC
Start: 2023-08-11 — End: 2023-08-11
  Filled 2023-08-11: qty 1

## 2023-08-11 MED ORDER — ATORVASTATIN CALCIUM 40 MG PO TABS
40.0000 mg | ORAL_TABLET | Freq: Every day | ORAL | Status: DC
  Filled 2023-08-11: qty 1

## 2023-08-11 MED ORDER — AMLODIPINE BESYLATE 10 MG PO TABS
5.0000 mg | ORAL_TABLET | Freq: Every day | ORAL | Status: DC
  Filled 2023-08-11: qty 1

## 2023-08-11 MED ORDER — PANTOPRAZOLE SODIUM 40 MG PO TBEC
40.0000 mg | DELAYED_RELEASE_TABLET | Freq: Every day | ORAL | Status: DC
  Filled 2023-08-11: qty 1

## 2023-08-11 MED ORDER — INSULIN ASPART 100 UNIT/ML IJ SOLN: 0.0000 [IU] | INTRAMUSCULAR | Status: DC

## 2023-08-11 MED ORDER — ACETAMINOPHEN 325 MG PO TABS: 650.0000 mg | ORAL_TABLET | Freq: Four times a day (QID) | ORAL | Status: DC | PRN

## 2023-08-11 NOTE — TOC CM/SW Note (Signed)
Transition of Care [facility]) - Inpatient Brief Assessment   Patient Details  Name: Jessica Vega MRN: [id number] Date of Birth: 07-31-1961  Transition of Care [facility]) CM/SW Contact:    Howell Rucks, RN Phone Number: 08/11/2023, 11:58 AM   Clinical Narrative: Met with pt and son at bedside to introduce role of TOC/NCM and review for dc planning. Pt reports she has an established PCP and pharmacy, no current home care services, pt reports she has lots of home DME ( walker, cane, potty chair, etc) but not currently having to use any, pt reports she feels safe returning home with support from her son, pt reports she drove herself to the hospital and will drive herself home with ok with attending, otherwise will call Benedetto Goad. MOON completed. TOC Brief Assessment completed. No TOC needs identified at this time.     Transition of Care Asessment: Insurance and Status: Insurance coverage has been reviewed Patient has primary care physician: Yes Home environment has been reviewed: resides in private residence with support from her son Prior level of function:: Independent Prior/Current Home Services: No current home services Social Determinants of Health Reivew: SDOH reviewed no interventions necessary Readmission risk has been reviewed: Yes Transition of care needs: no transition of care needs at this time

## 2023-08-11 NOTE — Discharge Summary (Signed)
Physician Discharge Summary  Jessica Vega:[id number] DOB: 09-05-1961 DOA: 08/10/2023  PCP: [facility]  Admit date: 08/10/2023 Discharge date: 08/11/2023  Admitted From: Home Disposition: Home  Recommendations for Outpatient Follow-up:  Follow up with PCP in 1-2 weeks Schedule follow-up with your pulmonology at [facility]. Continue to hold Xarelto until then.  Home Health: N/A Equipment/Devices: N/A  Discharge Condition: Stable CODE STATUS: Full code Diet recommendation: Low-salt diet  Discharge summary: 62 year old with history of AML status post bone marrow transplant, right middle lobe fungal mycetoma and aspergillosis infection, history of bacterial mitis bacteremia, left upper extremity DVT on Xarelto since 2018, hypertension hyperlipidemia, type 2 diabetes on metformin, ongoing smoker presented with about 2 days of coughing up streaks of blood with clear sputum.  No chest pain.  No fever.  In the emergency room hemodynamically stable.  CT angiogram was negative for PE but it showed 12 mm endobronchial mass within the medial segment bronchus of the right middle lobe concerning for malignancy?  Foreign body.  Patient does have a history of chronic infection and bronchiectasis at right middle lobe.  She was admitted.  Xarelto was held.  Seen by pulmonary.  Recommended bronchoscopy.  Patient currently asymptomatic and desired to do bronchoscopy as outpatient.  Patient has no episode of hemoptysis or cough since admission.  She is on room air.  Not ready to quit smoking.  She desires to follow-up with her pulmonology at [facility].  Discussed the CT scan findings, necessary to make diagnosis and patient is aware. Her last DVT was in 2018 that was likely provoked however since she has remained asymptomatic she can hold her Xarelto until she has a procedure.  Patient is agreeable.  Resume all home medications except Xarelto.   Discharge Diagnoses:  Principal  Problem:   Hemoptysis Active Problems:   HTN (hypertension)   History of DVT (deep vein thrombosis)   Hyperlipidemia   Type 2 diabetes mellitus [facility])    Discharge Instructions  Discharge Instructions     Call MD for:   Complete by: As directed    Any bleeding more than streaks of blood   Diet - low sodium heart healthy   Complete by: As directed    Discharge instructions   Complete by: As directed    Call and schedule follow up with your lung doctor at [facility]   Increase activity slowly   Complete by: As directed       Allergies as of 08/11/2023       Reactions   Chlorhexidine Rash   Other Itching   Transparent dressing causes itching per pt   Venlafaxine Hcl    Makes pt drowsy   Latex Rash        Medication List     STOP taking these medications    HYDROcodone-acetaminophen 5-325 MG tablet Commonly known as: Norco   rivaroxaban 20 MG Tabs tablet Commonly known as: XARELTO       TAKE these medications    acetaminophen 500 MG tablet Commonly known as: TYLENOL Take 500 mg by mouth every 6 (six) hours as needed.   Albuterol Sulfate 108 (90 Base) MCG/ACT Aepb Commonly known as: ProAir RespiClick Inhale 2 puffs into the lungs every 4 (four) hours as needed.   amLODipine 5 MG tablet Commonly known as: NORVASC Take 5 mg by mouth daily.   atorvastatin 40 MG tablet Commonly known as: LIPITOR Take 40 mg by mouth daily.   CALCIUM CITRATE PO  Take 1 tablet by mouth daily.   carboxymethylcellulose 0.5 % Soln Commonly known as: REFRESH PLUS Place 1 drop into both eyes 3 (three) times daily as needed (dry eyes).   cholecalciferol 1000 units tablet Commonly known as: VITAMIN D Take 1,000 Units by mouth daily after breakfast.   Estroven Weight Management Caps Take 1 capsule by mouth daily.   fluticasone 50 MCG/ACT nasal spray Commonly known as: FLONASE Place 1 spray into both nostrils daily as needed for allergies.   ketotifen 0.025 %  ophthalmic solution Commonly known as: ZADITOR Place 1 drop into both eyes 2 (two) times daily as needed (allergies).   Magnesium Oxide -Mg Supplement 500 MG Tabs Take 500 mg by mouth daily.   metFORMIN 500 MG tablet Commonly known as: GLUCOPHAGE Take 500 mg by mouth 2 (two) times daily with a meal.   omeprazole 20 MG capsule Commonly known as: PRILOSEC Take 20 mg by mouth daily.   Potassium Chloride ER 20 MEQ Tbcr Take 20 mEq by mouth 2 (two) times daily.        Allergies  Allergen Reactions   Chlorhexidine Rash   Other Itching    Transparent dressing causes itching per pt   Venlafaxine Hcl     Makes pt drowsy   Latex Rash    Consultations: Pulmonary   Procedures/Studies: CT Angio Chest PE W and/or Wo Contrast  Result Date: 08/11/2023 CLINICAL DATA:  Hemoptysis, pulmonary embolism EXAM: CT ANGIOGRAPHY CHEST WITH CONTRAST TECHNIQUE: Multidetector CT imaging of the chest was performed using the standard protocol during bolus administration of intravenous contrast. Multiplanar CT image reconstructions and MIPs were obtained to evaluate the vascular anatomy. RADIATION DOSE REDUCTION: This exam was performed according to the departmental dose-optimization program which includes automated exposure control, adjustment of the mA and/or kV according to patient size and/or use of iterative reconstruction technique. CONTRAST:  80mL OMNIPAQUE IOHEXOL 350 MG/ML SOLN COMPARISON:  12/08/2017 FINDINGS: Cardiovascular: There is adequate opacification of the pulmonary arterial tree. No intraluminal filling defect identified to suggest acute pulmonary embolism. The central pulmonary arteries are of normal caliber. No significant coronary artery calcification. Global cardiac size is within normal limits. No pericardial effusion. Thoracic aorta is unremarkable. Mediastinum/Nodes: No enlarged mediastinal, hilar, or axillary lymph nodes. Thyroid gland, trachea, and esophagus demonstrate no  significant findings. Lungs/Pleura: Mild emphysema. 4 mm noncalcified pulmonary nodule within the left apex is stable since remote prior examination and safely considered benign. A 12 mm endobronchial mass is seen within the medial segmental bronchus of the right middle lobe which is bronchiectatic distally with chronic collapse and volume loss. This may represent a primary neoplasm, such as an endobronchial carcinoid or a an aspirated foreign body. There is superimposed bronchial wall thickening seen diffusely in keeping with airway inflammation. No pneumothorax or pleural effusion. Upper Abdomen: Status post cholecystectomy.  No acute abnormality. Musculoskeletal: No acute bone abnormality. No lytic or blastic bone. Review of the MIP images confirms the above findings. IMPRESSION: 1. No pulmonary embolism. 2. 12 mm endobronchial mass within the medial segmental bronchus of the right middle lobe which is bronchiectatic distally with chronic collapse and volume loss. This may represent a primary neoplasm, such as an endobronchial carcinoid, or an aspirated foreign body. Bronchoscopic correlation is recommended. 3. Diffuse bronchial wall thickening in keeping with airway inflammation. Emphysema (ICD10-J43.9). Electronically Signed   By: Helyn Numbers M.D.   On: 08/11/2023 01:19   CT Soft Tissue Neck W Contrast  Result Date: 08/11/2023 CLINICAL DATA:  Hemoptysis EXAM: CT NECK WITH CONTRAST TECHNIQUE: Multidetector CT imaging of the neck was performed using the standard protocol following the bolus administration of intravenous contrast. RADIATION DOSE REDUCTION: This exam was performed according to the departmental dose-optimization program which includes automated exposure control, adjustment of the mA and/or kV according to patient size and/or use of iterative reconstruction technique. CONTRAST:  80mL OMNIPAQUE IOHEXOL 350 MG/ML SOLN COMPARISON:  04/03/2017 FINDINGS: PHARYNX AND LARYNX: The nasopharynx,  oropharynx and larynx are normal. Visible portions of the oral cavity, tongue base and floor of mouth are normal. Normal epiglottis, vallecula and pyriform sinuses. The larynx is normal. No retropharyngeal abscess, effusion or lymphadenopathy. SALIVARY GLANDS: Normal parotid, submandibular and sublingual glands. THYROID: Normal. LYMPH NODES: No enlarged or abnormal density lymph nodes. VASCULAR: Major cervical vessels are patent. LIMITED INTRACRANIAL: Normal. VISUALIZED ORBITS: Normal. MASTOIDS AND VISUALIZED PARANASAL SINUSES: No fluid levels or advanced mucosal thickening. No mastoid effusion. SKELETON: No bony spinal canal stenosis. No lytic or blastic lesions. UPPER CHEST: Clear. Unchanged 5 mm subpleural nodule in the left upper lobe, stable since 04/03/2017 and therefore benign. No follow-up imaging required. OTHER: None. IMPRESSION: Normal CT of the neck. Electronically Signed   By: Deatra Robinson M.D.   On: 08/11/2023 01:12   DG Chest 2 View  Result Date: 08/10/2023 CLINICAL DATA:  Hemoptysis, tobacco abuse EXAM: CHEST - 2 VIEW COMPARISON:  09/17/2018 FINDINGS: Frontal and lateral views of the chest demonstrate a stable cardiac silhouette. Chronic elevation of the right hemidiaphragm. No acute airspace disease, effusion, or pneumothorax. No acute bony abnormality. IMPRESSION: 1. No acute intrathoracic process. Electronically Signed   By: Sharlet Salina M.D.   On: 08/10/2023 23:58   (Echo, Carotid, EGD, Colonoscopy, ERCP)    Subjective: Patient seen and examined evening for discharge readiness.  She had detailed discussion with pulmonary.  Denies any further complaints.  Patient tells me that she is eager to go home and she will call for follow-up.  Son at the bedside.  I advised her that she should have follow-up and testing done within weeks.   Discharge Exam: Vitals:   08/11/23 0846 08/11/23 1215  BP: 127/79 124/82  Pulse: 64 64  Resp: 17 16  Temp: 97.7 F (36.5 C) 98.6 F (37 C)  SpO2:  99% 96%   Vitals:   08/11/23 0345 08/11/23 0419 08/11/23 0846 08/11/23 1215  BP: 128/72 (!) 135/94 127/79 124/82  Pulse:  (!) 59 64 64  Resp: 19 18 17 16   Temp:  97.7 F (36.5 C) 97.7 F (36.5 C) 98.6 F (37 C)  TempSrc:  Oral Oral Oral  SpO2:  99% 99% 96%  Weight:      Height:        General: Pt is alert, awake, not in acute distress Cardiovascular: RRR, S1/S2 +, no rubs, no gallops Respiratory: CTA bilaterally, no wheezing, no rhonchi Abdominal: Soft, NT, ND, bowel sounds + Extremities: no edema, no cyanosis    The results of significant diagnostics from this hospitalization (including imaging, microbiology, ancillary and laboratory) are listed below for reference.     Microbiology: No results found for this or any previous visit (from the past 240 hour(s)).   Labs: BNP (last 3 results) No results for input(s): "BNP" in the last 8760 hours. Basic Metabolic Panel: Recent Labs  Lab 08/10/23 2300 08/11/23 0822  NA 136 137  K 3.9 3.6  CL 104 102  CO2 24 27  GLUCOSE 150* 105*  BUN 15 13  CREATININE  0.89 0.75  CALCIUM 10.4* 10.7*   Liver Function Tests: Recent Labs  Lab 08/11/23 0822  AST 20  ALT 22  ALKPHOS 73  BILITOT 0.5  PROT 7.7  ALBUMIN 4.4   No results for input(s): "LIPASE", "AMYLASE" in the last 168 hours. No results for input(s): "AMMONIA" in the last 168 hours. CBC: Recent Labs  Lab 08/10/23 2300 08/11/23 0822  WBC 9.4 8.8  NEUTROABS 4.6  --   HGB 15.2* 17.0*  HCT 44.4 50.4*  MCV 94.1 95.1  PLT 264 284   Cardiac Enzymes: No results for input(s): "CKTOTAL", "CKMB", "CKMBINDEX", "TROPONINI" in the last 168 hours. BNP: Invalid input(s): "POCBNP" CBG: Recent Labs  Lab 08/11/23 0732 08/11/23 1217  GLUCAP 107* 176*   D-Dimer Recent Labs    08/11/23 0000  DDIMER <0.27   Hgb A1c Recent Labs    08/11/23 0822  HGBA1C 6.0*   Lipid Profile No results for input(s): "CHOL", "HDL", "LDLCALC", "TRIG", "CHOLHDL", "LDLDIRECT" in  the last 72 hours. Thyroid function studies No results for input(s): "TSH", "T4TOTAL", "T3FREE", "THYROIDAB" in the last 72 hours.  Invalid input(s): "FREET3" Anemia work up No results for input(s): "VITAMINB12", "FOLATE", "FERRITIN", "TIBC", "IRON", "RETICCTPCT" in the last 72 hours. Urinalysis    Component Value Date/Time   COLORURINE YELLOW 03/24/2009 0207   APPEARANCEUR TURBID (A) 03/24/2009 0207   LABSPEC 1.013 03/24/2009 0207   PHURINE 8.0 03/24/2009 0207   GLUCOSEU NEGATIVE 03/24/2009 0207   HGBUR TRACE (A) 03/24/2009 0207   BILIRUBINUR NEGATIVE 03/24/2009 0207   KETONESUR NEGATIVE 03/24/2009 0207   PROTEINUR NEGATIVE 03/24/2009 0207   UROBILINOGEN 0.2 03/24/2009 0207   NITRITE NEGATIVE 03/24/2009 0207   LEUKOCYTESUR NEGATIVE 03/24/2009 0207   Sepsis Labs Recent Labs  Lab 08/10/23 2300 08/11/23 0822  WBC 9.4 8.8   Microbiology No results found for this or any previous visit (from the past 240 hour(s)).   Time coordinating discharge: 32 minutes  SIGNED:   Dorcas Carrow, MD  [facility] 08/11/2023, 1:44 PM

## 2023-08-11 NOTE — Plan of Care (Signed)

## 2023-08-11 NOTE — ED Provider Notes (Signed)
[place] EMERGENCY DEPARTMENT AT [facility] Provider Note   CSN: [id number] Arrival date & time: 08/10/23  2231     History  Chief Complaint  Patient presents with   Hemoptysis    Jessica Vega is a 62 y.o. female.  Patient with a of AML status post bone marrow transplant in 2016, hypertension, diabetes, GERD, fibromyalgia, DVT on Xarelto presenting with hemoptysis.  States all of a sudden around 8 PM she started coughing up streaks of blood and blood clots coating multiple tissues.  Before this she was feeling fine.  Denies any recent cough, runny nose, sore throat, fever, chest pain or shortness of breath.  She has never had this problem before.  Does smoke but no diagnosis of COPD or asthma.  No history of lung cancer.  She is never coughed up blood before.  She feels heavy in her chest but no chest pain.  She does take Xarelto and has had no missed doses.  Does not feel short of breath.  No difficulty breathing or difficulty swallowing.  The history is provided by the patient.       Home Medications Prior to Admission medications   Medication Sig Start Date End Date Taking? Authorizing Provider  Albuterol Sulfate (PROAIR RESPICLICK) 108 (90 Base) MCG/ACT AEPB Inhale 2 puffs into the lungs every 4 (four) hours as needed. 09/27/16   Elvina Sidle, MD  amLODipine (NORVASC) 5 MG tablet Take 5 mg by mouth daily.    [provider]  Ascorbic Acid (VITAMIN C PO) Take 1 tablet by mouth daily.    [provider]  atorvastatin (LIPITOR) 40 MG tablet Take 40 mg by mouth daily.    [provider]  benzonatate (TESSALON) 100 MG capsule Take 100-200 mg by mouth 2 (two) times daily as needed for cough.    [provider]  CALCIUM CITRATE PO Take 1 tablet by mouth daily.    [provider]  carboxymethylcellulose (REFRESH PLUS) 0.5 % SOLN Place 1 drop into both eyes 3 (three) times daily as needed (dry eyes).    [provider]  cholecalciferol (VITAMIN D) 1000 UNITS tablet Take 1,000 Units by mouth daily after breakfast.     [provider]  Cod Liver Oil 10 MINIM CAPS Take 1 capsule by mouth daily.    [provider]  fluticasone (FLONASE) 50 MCG/ACT nasal spray Place 1 spray into both nostrils daily as needed for allergies.    [provider]  HYDROcodone-acetaminophen (NORCO) 5-325 MG tablet Take 1 tablet by mouth every 4 (four) hours as needed for severe pain. 03/11/22   Bjorn Pippin, MD  ketotifen (ZADITOR) 0.025 % ophthalmic solution Place 1 drop into both eyes 2 (two) times daily as needed (allergies).    [provider]  Magnesium Oxide 500 MG TABS Take 500 mg by mouth daily.    [provider]  metFORMIN (GLUCOPHAGE) 500 MG tablet Take 500 mg by mouth 2 (two) times daily with a meal.    [provider]  nicotine (NICODERM CQ - DOSED IN MG/24 HOURS) 21 mg/24hr patch Place 21 mg onto the skin daily.    [provider]  nicotine polacrilex (NICORETTE) 2 MG gum Take 2 mg by mouth as needed for smoking cessation.    [provider]  Nutritional Supplements (ESTROVEN WEIGHT MANAGEMENT) CAPS Take 1 capsule by mouth daily.    [provider]  omeprazole (PRILOSEC) 20 MG capsule Take 20 mg  by mouth 2 (two) times daily as needed (acid reflux). 08/31/15   [provider]  Potassium Chloride ER 20 MEQ TBCR Take 20 mEq by mouth 2 (two) times daily.    [provider]  rivaroxaban (XARELTO) 20 MG TABS tablet Take 20 mg by mouth daily with supper.    [provider]  zinc gluconate 50 MG tablet Take 50 mg by mouth daily.    [provider]      Allergies    Chlorhexidine, Other, Venlafaxine hcl, and Latex    Review of Systems   Review of Systems  Constitutional:  Negative for activity change, appetite change and fever.  HENT:  Negative for congestion and rhinorrhea.   Respiratory:  Positive for  chest tightness and shortness of breath.   Cardiovascular:  Negative for chest pain.  Gastrointestinal:  Negative for abdominal pain, nausea and vomiting.  Genitourinary:  Negative for dysuria and hematuria.  Musculoskeletal:  Negative for arthralgias and myalgias.  Skin:  Negative for rash.  Neurological:  Negative for dizziness, weakness and headaches.   all other systems are negative except as noted in the HPI and PMH.    Physical Exam Updated Vital Signs BP 134/80   Pulse 85   Temp 98.5 F (36.9 C) (Oral)   Resp 17   Ht 5\' 1"  (1.549 m)   Wt 69.4 kg   SpO2 98%   BMI 28.91 kg/m  Physical Exam Vitals and nursing note reviewed.  Constitutional:      General: She is not in acute distress.    Appearance: She is well-developed.     Comments: Speaking full sentences, no distress  HENT:     Head: Normocephalic and atraumatic.     Mouth/Throat:     Pharynx: No oropharyngeal exudate.     Comments: No blood in oropharynx Eyes:     Conjunctiva/sclera: Conjunctivae normal.     Pupils: Pupils are equal, round, and reactive to light.  Neck:     Comments: No meningismus. Cardiovascular:     Rate and Rhythm: Normal rate and regular rhythm.     Heart sounds: Normal heart sounds. No murmur heard. Pulmonary:     Effort: Pulmonary effort is normal. No respiratory distress.     Breath sounds: Normal breath sounds.  Abdominal:     Palpations: Abdomen is soft.     Tenderness: There is no abdominal tenderness. There is no guarding or rebound.  Musculoskeletal:        General: No tenderness. Normal range of motion.     Cervical back: Normal range of motion and neck supple.  Skin:    General: Skin is warm.  Neurological:     Mental Status: She is alert and oriented to person, place, and time.     Cranial Nerves: No cranial nerve deficit.     Motor: No abnormal muscle tone.     Coordination: Coordination normal.     Comments:  5/5 strength throughout. CN 2-12 intact.Equal grip  strength.   Psychiatric:        Behavior: Behavior normal.     ED Results / Procedures / Treatments   Labs (all labs ordered are listed, but only abnormal results are displayed) Labs Reviewed  CBC WITH DIFFERENTIAL/PLATELET - Abnormal; Notable for the following components:      Result Value   Hemoglobin 15.2 (*)    All other components within normal limits  BASIC METABOLIC PANEL - Abnormal; Notable for the following components:  Glucose, Bld 150 (*)    Calcium 10.4 (*)    All other components within normal limits  D-DIMER, QUANTITATIVE  TROPONIN I (HIGH SENSITIVITY)  TROPONIN I (HIGH SENSITIVITY)    EKG EKG Interpretation Date/Time:  Friday August 11 2023 00:02:30 EST Ventricular Rate:  71 PR Interval:  152 QRS Duration:  100 QT Interval:  385 QTC Calculation: 419 R Axis:   -30  Text Interpretation: Sinus rhythm Low voltage, precordial leads RSR' in V1 or V2, right VCD or RVH Left ventricular hypertrophy Anterior Q waves, possibly due to LVH No significant change was found Confirmed by Glynn Octave [phone]) on 08/11/2023 12:07:15 AM  Radiology CT Angio Chest PE W and/or Wo Contrast  Result Date: 08/11/2023 CLINICAL DATA:  Hemoptysis, pulmonary embolism EXAM: CT ANGIOGRAPHY CHEST WITH CONTRAST TECHNIQUE: Multidetector CT imaging of the chest was performed using the standard protocol during bolus administration of intravenous contrast. Multiplanar CT image reconstructions and MIPs were obtained to evaluate the vascular anatomy. RADIATION DOSE REDUCTION: This exam was performed according to the departmental dose-optimization program which includes automated exposure control, adjustment of the mA and/or kV according to patient size and/or use of iterative reconstruction technique. CONTRAST:  80mL OMNIPAQUE IOHEXOL 350 MG/ML SOLN COMPARISON:  12/08/2017 FINDINGS: Cardiovascular: There is adequate opacification of the pulmonary arterial tree. No intraluminal filling defect  identified to suggest acute pulmonary embolism. The central pulmonary arteries are of normal caliber. No significant coronary artery calcification. Global cardiac size is within normal limits. No pericardial effusion. Thoracic aorta is unremarkable. Mediastinum/Nodes: No enlarged mediastinal, hilar, or axillary lymph nodes. Thyroid gland, trachea, and esophagus demonstrate no significant findings. Lungs/Pleura: Mild emphysema. 4 mm noncalcified pulmonary nodule within the left apex is stable since remote prior examination and safely considered benign. A 12 mm endobronchial mass is seen within the medial segmental bronchus of the right middle lobe which is bronchiectatic distally with chronic collapse and volume loss. This may represent a primary neoplasm, such as an endobronchial carcinoid or a an aspirated foreign body. There is superimposed bronchial wall thickening seen diffusely in keeping with airway inflammation. No pneumothorax or pleural effusion. Upper Abdomen: Status post cholecystectomy.  No acute abnormality. Musculoskeletal: No acute bone abnormality. No lytic or blastic bone. Review of the MIP images confirms the above findings. IMPRESSION: 1. No pulmonary embolism. 2. 12 mm endobronchial mass within the medial segmental bronchus of the right middle lobe which is bronchiectatic distally with chronic collapse and volume loss. This may represent a primary neoplasm, such as an endobronchial carcinoid, or an aspirated foreign body. Bronchoscopic correlation is recommended. 3. Diffuse bronchial wall thickening in keeping with airway inflammation. Emphysema (ICD10-J43.9). Electronically Signed   By: Helyn Numbers M.D.   On: 08/11/2023 01:19   CT Soft Tissue Neck W Contrast  Result Date: 08/11/2023 CLINICAL DATA:  Hemoptysis EXAM: CT NECK WITH CONTRAST TECHNIQUE: Multidetector CT imaging of the neck was performed using the standard protocol following the bolus administration of intravenous contrast.  RADIATION DOSE REDUCTION: This exam was performed according to the departmental dose-optimization program which includes automated exposure control, adjustment of the mA and/or kV according to patient size and/or use of iterative reconstruction technique. CONTRAST:  80mL OMNIPAQUE IOHEXOL 350 MG/ML SOLN COMPARISON:  04/03/2017 FINDINGS: PHARYNX AND LARYNX: The nasopharynx, oropharynx and larynx are normal. Visible portions of the oral cavity, tongue base and floor of mouth are normal. Normal epiglottis, vallecula and pyriform sinuses. The larynx is normal. No retropharyngeal abscess, effusion or lymphadenopathy. SALIVARY GLANDS:  Normal parotid, submandibular and sublingual glands. THYROID: Normal. LYMPH NODES: No enlarged or abnormal density lymph nodes. VASCULAR: Major cervical vessels are patent. LIMITED INTRACRANIAL: Normal. VISUALIZED ORBITS: Normal. MASTOIDS AND VISUALIZED PARANASAL SINUSES: No fluid levels or advanced mucosal thickening. No mastoid effusion. SKELETON: No bony spinal canal stenosis. No lytic or blastic lesions. UPPER CHEST: Clear. Unchanged 5 mm subpleural nodule in the left upper lobe, stable since 04/03/2017 and therefore benign. No follow-up imaging required. OTHER: None. IMPRESSION: Normal CT of the neck. Electronically Signed   By: Deatra Robinson M.D.   On: 08/11/2023 01:12   DG Chest 2 View  Result Date: 08/10/2023 CLINICAL DATA:  Hemoptysis, tobacco abuse EXAM: CHEST - 2 VIEW COMPARISON:  09/17/2018 FINDINGS: Frontal and lateral views of the chest demonstrate a stable cardiac silhouette. Chronic elevation of the right hemidiaphragm. No acute airspace disease, effusion, or pneumothorax. No acute bony abnormality. IMPRESSION: 1. No acute intrathoracic process. Electronically Signed   By: Sharlet Salina M.D.   On: 08/10/2023 23:58    Procedures Procedures    Medications Ordered in ED Medications - No data to display  ED Course/ Medical Decision Making/ A&P                                  Medical Decision Making Amount and/or Complexity of Data Reviewed Labs: ordered. Decision-making details documented in ED Course. Radiology: ordered and independent interpretation performed. Decision-making details documented in ED Course. ECG/medicine tests: ordered and independent interpretation performed. Decision-making details documented in ED Course.  Risk Prescription drug management. Decision regarding hospitalization.   Patient with AML status post bone marrow transfusion on Xarelto for history of DVT here with hemoptysis.  No respiratory distress.  Clear lungs.  Chest x-ray shows no infiltrate or mass.  Results reviewed interpreted by me.  Vital stable.  No hypoxia or tachycardia.  No respiratory distress.  Hemoglobin stable at 15.2.  She is anticoagulated.  CT scan is obtained to evaluate for lung mass versus blood clot versus other acute pathology.  CT shows no pulmonary embolism.  Does show possible endobronchial lesion in right middle lobe. Concern for mass versus foreign body.  This likely is contributing to her hemoptysis.  Per chart review: " Has been working with [facility] since 07/2017 for tx of newly diagnosed bronchiectasis, worsening dyspnea on exertion/at rest and persistent pulmonary intraluminal mass. She has had numerous bronchoscopies over the years per chart review yielding pseudomonas, moraxella catarrhalis, candida glabrata (07/2014 - fungemic during presentation and tx with 4x IV antifungals then secondary prophylaxis), penicillium, saprophytic fungus. Her last bronchoscopy was performed in November 2018 - cultures were positive for pseudomonas only and she was treated with course of Levaquin with improvement. "  Patient remained stable in the ED.  Discussed with pulmonology Dr. Lonzo Candy.  Will see patient in the morning for consideration of bronchoscopy.  Hold Xarelto.  Admission discussed with Dr. Loney Loh.         Final Clinical  Impression(s) / ED Diagnoses Final diagnoses:  None    Rx / DC Orders ED Discharge Orders     None         Braxley Balandran, Jeannett Senior, MD 08/11/23 0320

## 2023-08-11 NOTE — ED Provider Notes (Incomplete)
[place] EMERGENCY DEPARTMENT AT [facility] Provider Note   CSN: [id number] Arrival date & time: 08/10/23  2231     History {Add pertinent medical, surgical, social history, OB history to HPI:1} Chief Complaint  Patient presents with  . Hemoptysis    Jessica Vega is a 62 y.o. female.  HPI     Home Medications Prior to Admission medications   Medication Sig Start Date End Date Taking? Authorizing Provider  Albuterol Sulfate (PROAIR RESPICLICK) 108 (90 Base) MCG/ACT AEPB Inhale 2 puffs into the lungs every 4 (four) hours as needed. 09/27/16   Elvina Sidle, MD  amLODipine (NORVASC) 5 MG tablet Take 5 mg by mouth daily.    [provider]  Ascorbic Acid (VITAMIN C PO) Take 1 tablet by mouth daily.    [provider]  atorvastatin (LIPITOR) 40 MG tablet Take 40 mg by mouth daily.    [provider]  benzonatate (TESSALON) 100 MG capsule Take 100-200 mg by mouth 2 (two) times daily as needed for cough.    [provider]  CALCIUM CITRATE PO Take 1 tablet by mouth daily.    [provider]  carboxymethylcellulose (REFRESH PLUS) 0.5 % SOLN Place 1 drop into both eyes 3 (three) times daily as needed (dry eyes).    [provider]  cholecalciferol (VITAMIN D) 1000 UNITS tablet Take 1,000 Units by mouth daily after breakfast.     [provider]  Cod Liver Oil 10 MINIM CAPS Take 1 capsule by mouth daily.    [provider]  fluticasone (FLONASE) 50 MCG/ACT nasal spray Place 1 spray into both nostrils daily as needed for allergies.    [provider]  HYDROcodone-acetaminophen (NORCO) 5-325 MG tablet Take 1 tablet by mouth every 4 (four) hours as needed for severe pain. 03/11/22   Bjorn Pippin, MD  ketotifen (ZADITOR) 0.025 % ophthalmic solution Place 1 drop into both eyes 2 (two) times daily as needed (allergies).    [provider]  Magnesium Oxide 500 MG TABS Take 500 mg by mouth  daily.    [provider]  metFORMIN (GLUCOPHAGE) 500 MG tablet Take 500 mg by mouth 2 (two) times daily with a meal.    [provider]  nicotine (NICODERM CQ - DOSED IN MG/24 HOURS) 21 mg/24hr patch Place 21 mg onto the skin daily.    [provider]  nicotine polacrilex (NICORETTE) 2 MG gum Take 2 mg by mouth as needed for smoking cessation.    [provider]  Nutritional Supplements (ESTROVEN WEIGHT MANAGEMENT) CAPS Take 1 capsule by mouth daily.    [provider]  omeprazole (PRILOSEC) 20 MG capsule Take 20 mg by mouth 2 (two) times daily as needed (acid reflux). 08/31/15   [provider]  Potassium Chloride ER 20 MEQ TBCR Take 20 mEq by mouth 2 (two) times daily.    [provider]  rivaroxaban (XARELTO) 20 MG TABS tablet Take 20 mg by mouth daily with supper.    [provider]  zinc gluconate 50 MG tablet Take 50 mg by mouth daily.    [provider]      Allergies    Chlorhexidine, Other, Venlafaxine hcl, and Latex    Review of Systems   Review of Systems  Physical Exam Updated Vital Signs BP 134/80   Pulse 85   Temp 98.5 F (36.9 C) (Oral)   Resp 17   Ht 5\' 1"  (1.549 m)  Wt 69.4 kg   SpO2 98%   BMI 28.91 kg/m  Physical Exam  ED Results / Procedures / Treatments   Labs (all labs ordered are listed, but only abnormal results are displayed) Labs Reviewed  CBC WITH DIFFERENTIAL/PLATELET - Abnormal; Notable for the following components:      Result Value   Hemoglobin 15.2 (*)    All other components within normal limits  BASIC METABOLIC PANEL - Abnormal; Notable for the following components:   Glucose, Bld 150 (*)    Calcium 10.4 (*)    All other components within normal limits  D-DIMER, QUANTITATIVE  TROPONIN I (HIGH SENSITIVITY)    EKG None  Radiology DG Chest 2 View  Result Date: 08/10/2023 CLINICAL DATA:  Hemoptysis, tobacco abuse EXAM: CHEST - 2 VIEW COMPARISON:   09/17/2018 FINDINGS: Frontal and lateral views of the chest demonstrate a stable cardiac silhouette. Chronic elevation of the right hemidiaphragm. No acute airspace disease, effusion, or pneumothorax. No acute bony abnormality. IMPRESSION: 1. No acute intrathoracic process. Electronically Signed   By: Sharlet Salina M.D.   On: 08/10/2023 23:58    Procedures Procedures  {Document cardiac monitor, telemetry assessment procedure when appropriate:1}  Medications Ordered in ED Medications - No data to display  ED Course/ Medical Decision Making/ A&P   {   Click here for ABCD2, HEART and other calculatorsREFRESH Note before signing :1}                              Medical Decision Making Amount and/or Complexity of Data Reviewed Labs: ordered. Radiology: ordered. ECG/medicine tests: ordered.   ***  {Document critical care time when appropriate:1} {Document review of labs and clinical decision tools ie heart score, Chads2Vasc2 etc:1}  {Document your independent review of radiology images, and any outside records:1} {Document your discussion with family members, caretakers, and with consultants:1} {Document social determinants of health affecting pt's care:1} {Document your decision making why or why not admission, treatments were needed:1} Final Clinical Impression(s) / ED Diagnoses Final diagnoses:  None    Rx / DC Orders ED Discharge Orders     None

## 2023-08-11 NOTE — Consult Note (Signed)
NAME:  Jessica Vega, MRN:  [id number], DOB:  May 20, 1961, LOS: 0 ADMISSION DATE:  08/10/2023, CONSULTATION DATE:  08/11/2023 REFERRING MD:  Dorcas Carrow, MD, CHIEF COMPLAINT:  hemoptysis, lung mass  History of Present Illness:  Jessica Vega is a 62 y.o. woman with past medical history of tobacco use disorder, chronic bronchiectasis, and known RML endobronchial lesion. She presents from the ed for hemoptysis. Of note she is on xarelto for a LUE (internal jugular, brachiocephalic and subclavian) DVT in 2018 and also has a history of AML (in remission) s/p BMT in 2016. Here with hemoptysis for two days duration. She is coughing up spoonfuls of mostly bright red blood. Last coughed up yellow tinged sputum around 4am this morning. No frank blood since arrival to ED. She says her coughing is consistent and unchaged and she feels it is related to reflux. She denies fevers, chills, night sweats, weight loss or dyspnea. She has "a lot on her plate" and many family members to answer to and take care of. She really does not want to stay in the hospital if she doesn't have to.   Pertinent  Medical History  AML s/p BMET Tobacco use disorder 1 ppd x 40 years.   Significant Hospital Events: Including procedures, antibiotic start and stop dates in addition to other pertinent events     Interim History / Subjective:    Objective   Blood pressure 127/79, pulse 64, temperature 97.7 F (36.5 C), temperature source Oral, resp. rate 17, height 5\' 1"  (1.549 m), weight 69.4 kg, SpO2 99%.        Intake/Output Summary (Last 24 hours) at 08/11/2023 0948 Last data filed at 08/11/2023 0500 Gross per 24 hour  Intake 0 ml  Output --  Net 0 ml   Filed Weights   08/10/23 2246  Weight: 69.4 kg    Examination: General: well appearing no distress HENT: no blood Lungs: ctab no wheezes or crackles Cardiovascular: RRR no mrg Abdomen: soft, nontender Extremities: no edema Neuro: normal speech, anxious  affect  Resolved Hospital Problem list     Assessment & Plan:  Hemoptysis Tobacco use disorder 40 pack years Known endobronchial lesion, stable on subsequent imaging RML bronchiectasis with COPD  Unprovoked RUE DVT in 2018 on xarelto GERD Anxiety H/o AML s/p BMET 2016.   The most likely explanation for her hemoptysis is coughing in the setting of anticoagulation from xarelto. The amount of hemoptysis is really quit mild and she has had it before, last 2-3 years ago. She does not appear to have any acute pulmonary process  or infection.  She states she coughs at night due to her GERD. She does not think she has a smokers cough. She is not interested in quitting smoking.   We discussed bronchoscopy to biopsy/identify the endobronchial lesion. Based on appearance what she really needs is surgical consultation for resection - usually this is done in the setting of recurrent symptoms. Recurrent hemoptysis would be one such indication. Right now she does not want to wait until Monday for a bronchoscopy. She is not really keen on any procedure/intervention.  She has known stable endobronchial lesion differential diagnosis includes endobronchial primary SCC, carcinoid tumor, mycetoma or aspergilloma. I suspect this is most likely fungus ball vs carcinoid tumor given years of stable appearance.   I have encouraged her to follow up with her pulmonologist at [facility] Dr. Sharmon Revere and to quit smoking. If she has recurrent hemoptysis I do think she should have  consideration for bronchoscopy to confirm that the locus is the right middle lobe, and then referral to thoracic surgery for resection.   Given that her DVT was provoked probably lifelong AC should be recommended based on chest 2021 VTE guidelines. It does not appear this was in the setting of any kind of IV/catheter placement or during her malignancy treatment.   I spent 60 minutes in total visit time for this patient, with more than 50% spent  counseling/coordinating care.  Durel Salts, MD Pulmonary and Critical Care Medicine [facility] 08/11/2023 12:31 PM Pager: see AMION  If no response to pager, please call critical care on call (see AMION) until 7pm After 7:00 pm call Elink       Labs   CBC: Recent Labs  Lab 08/10/23 2300 08/11/23 0822  WBC 9.4 8.8  NEUTROABS 4.6  --   HGB 15.2* 17.0*  HCT 44.4 50.4*  MCV 94.1 95.1  PLT 264 284    Basic Metabolic Panel: Recent Labs  Lab 08/10/23 2300 08/11/23 0822  NA 136 137  K 3.9 3.6  CL 104 102  CO2 24 27  GLUCOSE 150* 105*  BUN 15 13  CREATININE 0.89 0.75  CALCIUM 10.4* 10.7*   GFR: Estimated Creatinine Clearance: 65.8 mL/min (by C-G formula based on SCr of 0.75 mg/dL). Recent Labs  Lab 08/10/23 2300 08/11/23 0822  WBC 9.4 8.8    Liver Function Tests: Recent Labs  Lab 08/11/23 0822  AST 20  ALT 22  ALKPHOS 73  BILITOT 0.5  PROT 7.7  ALBUMIN 4.4   No results for input(s): "LIPASE", "AMYLASE" in the last 168 hours. No results for input(s): "AMMONIA" in the last 168 hours.  ABG    Component Value Date/Time   TCO2 21 04/30/2013 0552     Coagulation Profile: No results for input(s): "INR", "PROTIME" in the last 168 hours.  Cardiac Enzymes: No results for input(s): "CKTOTAL", "CKMB", "CKMBINDEX", "TROPONINI" in the last 168 hours.  HbA1C: Hgb A1c MFr Bld  Date/Time Value Ref Range Status  03/07/2022 01:52 PM 6.4 (H) 4.8 - 5.6 % Final    Comment:    (NOTE) Pre diabetes:          5.7%-6.4%  Diabetes:              >6.4%  Glycemic control for   <7.0% adults with diabetes     CBG: Recent Labs  Lab 08/11/23 0732  GLUCAP 107*    Review of Systems:   As in HPI  Past Medical History:  She,  has a past medical history of AML (acute myeloblastic leukemia) (HCC), Anemia, Anxiety, Depression, Diabetes mellitus without complication (HCC), Fibromyalgia, GERD (gastroesophageal reflux disease), H/O blood clots, Headache,  History of kidney stones, Hypertension, and Pneumonia.   Surgical History:   Past Surgical History:  Procedure Laterality Date   ABDOMINAL HYSTERECTOMY     CESAREAN SECTION     CHOLECYSTECTOMY     CYSTOSCOPY WITH RETROGRADE PYELOGRAM, URETEROSCOPY AND STENT PLACEMENT Left 03/11/2022   Procedure: CYSTOSCOPY WITH LEFT RETROGRADE PYELOGRAM, URETEROSCOPY AND STENT PLACEMENT;  Surgeon: Bjorn Pippin, MD;  Location: [facility];  Service: Urology;  Laterality: Left;   ectoopic pregnancy      LITHOTRIPSY       Social History:   reports that she has been smoking cigarettes. She has never used smokeless tobacco. She reports current alcohol use of about 1.0 standard drink of alcohol per week. She reports that she does not use drugs.  Family History:  Her family history includes CVA in her mother; Diabetes Mellitus II in her father; Renal Disease in her father.   Allergies Allergies  Allergen Reactions   Chlorhexidine Rash   Other Itching    Transparent dressing causes itching per pt   Venlafaxine Hcl     Makes pt drowsy   Latex Rash     Home Medications  Prior to Admission medications   Medication Sig Start Date End Date Taking? Authorizing Provider  acetaminophen (TYLENOL) 500 MG tablet Take 500 mg by mouth every 6 (six) hours as needed.   Yes [provider]  amLODipine (NORVASC) 5 MG tablet Take 5 mg by mouth daily.   Yes [provider]  atorvastatin (LIPITOR) 40 MG tablet Take 40 mg by mouth daily.   Yes [provider]  CALCIUM CITRATE PO Take 1 tablet by mouth daily.   Yes [provider]  cholecalciferol (VITAMIN D) 1000 UNITS tablet Take 1,000 Units by mouth daily after breakfast.    Yes [provider]  fluticasone (FLONASE) 50 MCG/ACT nasal spray Place 1 spray into both nostrils daily as needed for allergies.   Yes [provider]  ketotifen (ZADITOR) 0.025 % ophthalmic solution Place 1 drop into both eyes 2 (two) times daily as  needed (allergies).   Yes [provider]  Magnesium Oxide 500 MG TABS Take 500 mg by mouth daily.   Yes [provider]  metFORMIN (GLUCOPHAGE) 500 MG tablet Take 500 mg by mouth 2 (two) times daily with a meal.   Yes [provider]  Nutritional Supplements (ESTROVEN WEIGHT MANAGEMENT) CAPS Take 1 capsule by mouth daily.   Yes [provider]  omeprazole (PRILOSEC) 20 MG capsule Take 20 mg by mouth daily.   Yes [provider]  Potassium Chloride ER 20 MEQ TBCR Take 20 mEq by mouth 2 (two) times daily.   Yes [provider]  rivaroxaban (XARELTO) 20 MG TABS tablet Take 20 mg by mouth daily with supper.   Yes [provider]  Albuterol Sulfate (PROAIR RESPICLICK) 108 (90 Base) MCG/ACT AEPB Inhale 2 puffs into the lungs every 4 (four) hours as needed. Patient not taking: Reported on 08/11/2023 09/27/16   Elvina Sidle, MD  carboxymethylcellulose (REFRESH PLUS) 0.5 % SOLN Place 1 drop into both eyes 3 (three) times daily as needed (dry eyes).    [provider]  HYDROcodone-acetaminophen (NORCO) 5-325 MG tablet Take 1 tablet by mouth every 4 (four) hours as needed for severe pain. Patient not taking: Reported on 08/11/2023 03/11/22   Bjorn Pippin, MD

## 2023-08-11 NOTE — Care Management Obs Status (Signed)
MEDICARE OBSERVATION STATUS NOTIFICATION   Patient Details  Name: Jessica Vega MRN: [id number] Date of Birth: 1961-05-07   Medicare Observation Status Notification Given:       Howell Rucks, RN 08/11/2023, 11:54 AM

## 2023-08-11 NOTE — ED Notes (Signed)
ED TO INPATIENT HANDOFF REPORT  ED Nurse Name and Phone #: Birgit Nowling  S Name/Age/Gender Jessica Vega 62 y.o. female Room/Bed: WA15/WA15  Code Status   Code Status: Prior  Home/SNF/Other Home Patient oriented to: self, place, time, and situation Is this baseline? Yes   Triage Complete: Triage complete  Chief Complaint Hemoptysis [R04.2]  Triage Note Patient states she started coughing up blood all of a sudden. Patient feels like her cough is "weird". Patient smoke cigarettes and is use t her normal smokers cough. Patient also states she lives with her mother who likes to turn the heat up. Denies throat soreness. Patient has a history of lung cancer. States she feels heavy in her chest.    Allergies Allergies  Allergen Reactions   Chlorhexidine Rash   Other Itching    Transparent dressing causes itching per pt   Venlafaxine Hcl     Makes pt drowsy   Latex Rash    Level of Care/Admitting Diagnosis ED Disposition     ED Disposition  Admit   Condition  --   Comment  Hospital Area: [facility] [[id number]]  Level of Care: Progressive [102]  Admit to Progressive based on following criteria: RESPIRATORY PROBLEMS hypoxemic/hypercapnic respiratory failure that is responsive to NIPPV (BiPAP) or High Flow Nasal Cannula (6-80 lpm). Frequent assessment/intervention, no > Q2 hrs < Q4 hrs, to maintain oxygenation and pulmonary hygiene.  May place patient in observation at [facility] or [facility] if equivalent level of care is available:: Yes  Covid Evaluation: Asymptomatic - no recent exposure (last 10 days) testing not required  Diagnosis: Hemoptysis [741752]  Admitting Physician: John Giovanni [[id number]]  Attending Physician: John Giovanni [[id number]]          B Medical/Surgery History Past Medical History:  Diagnosis Date   AML (acute myeloblastic leukemia) (HCC)    s/p bone marrow transplant in 2016 complicated by CMV, colitis, and sepsis    Anemia    Anxiety    Depression    Diabetes mellitus without complication (HCC)    Fibromyalgia    GERD (gastroesophageal reflux disease)    H/O blood clots    in left neck per pt   Headache    History of kidney stones    Hypertension    Pneumonia    Past Surgical History:  Procedure Laterality Date   ABDOMINAL HYSTERECTOMY     CESAREAN SECTION     CHOLECYSTECTOMY     CYSTOSCOPY WITH RETROGRADE PYELOGRAM, URETEROSCOPY AND STENT PLACEMENT Left 03/11/2022   Procedure: CYSTOSCOPY WITH LEFT RETROGRADE PYELOGRAM, URETEROSCOPY AND STENT PLACEMENT;  Surgeon: Bjorn Pippin, MD;  Location: [facility];  Service: Urology;  Laterality: Left;   ectoopic pregnancy      LITHOTRIPSY       A IV Location/Drains/Wounds Patient Lines/Drains/Airways Status     Active Line/Drains/Airways     Name Placement date Placement time Site Days   Peripheral IV 08/11/23 20 G Right Antecubital 08/11/23  0011  Antecubital  less than 1   Ureteral Drain/Stent Left ureter 6 Fr. 03/11/22  0803  Left ureter  518   Incision (Closed) 03/11/22 Perineum Other (Comment) 03/11/22  0758  -- 518            Intake/Output Last 24 hours No intake or output data in the 24 hours ending 08/11/23 0321  Labs/Imaging Results for orders placed or performed during the hospital encounter of 08/10/23 (from the past 48 hour(s))  CBC with Differential  Status: Abnormal   Collection Time: 08/10/23 11:00 PM  Result Value Ref Range   WBC 9.4 4.0 - 10.5 K/uL   RBC 4.72 3.87 - 5.11 MIL/uL   Hemoglobin 15.2 (H) 12.0 - 15.0 g/dL   HCT 81.1 91.4 - 78.2 %   MCV 94.1 80.0 - 100.0 fL   MCH 32.2 26.0 - 34.0 pg   MCHC 34.2 30.0 - 36.0 g/dL   RDW 95.6 21.3 - 08.6 %   Platelets 264 150 - 400 K/uL   nRBC 0.0 0.0 - 0.2 %   Neutrophils Relative % 49 %   Neutro Abs 4.6 1.7 - 7.7 K/uL   Lymphocytes Relative 41 %   Lymphs Abs 3.8 0.7 - 4.0 K/uL   Monocytes Relative 8 %   Monocytes Absolute 0.8 0.1 - 1.0 K/uL   Eosinophils Relative 1 %    Eosinophils Absolute 0.1 0.0 - 0.5 K/uL   Basophils Relative 1 %   Basophils Absolute 0.1 0.0 - 0.1 K/uL   Immature Granulocytes 0 %   Abs Immature Granulocytes 0.02 0.00 - 0.07 K/uL    Comment: Performed at [facility], [address], [place], [place] [address]  Basic metabolic panel     Status: Abnormal   Collection Time: 08/10/23 11:00 PM  Result Value Ref Range   Sodium 136 135 - 145 mmol/L   Potassium 3.9 3.5 - 5.1 mmol/L   Chloride 104 98 - 111 mmol/L   CO2 24 22 - 32 mmol/L   Glucose, Bld 150 (H) 70 - 99 mg/dL    Comment: Glucose reference range applies only to samples taken after fasting for at least 8 hours.   BUN 15 8 - 23 mg/dL   Creatinine, Ser 9.62 0.44 - 1.00 mg/dL   Calcium 95.2 (H) 8.9 - 10.3 mg/dL   GFR, Estimated >84 >13 mL/min    Comment: (NOTE) Calculated using the CKD-EPI Creatinine Equation (2021)    Anion gap 8 5 - 15    Comment: Performed at [facility], [address], [place], [place] [address]  Troponin I (High Sensitivity)     Status: None   Collection Time: 08/11/23 12:00 AM  Result Value Ref Range   Troponin I (High Sensitivity) 6 <18 ng/L    Comment: (NOTE) Elevated high sensitivity troponin I (hsTnI) values and significant  changes across serial measurements may suggest ACS but many other  chronic and acute conditions are known to elevate hsTnI results.  Refer to the "Links" section for chest pain algorithms and additional  guidance. Performed at [facility], [address], [place], [place] [address]   D-dimer, quantitative     Status: None   Collection Time: 08/11/23 12:00 AM  Result Value Ref Range   D-Dimer, Quant <0.27 0.00 - 0.50 ug/mL-FEU    Comment: (NOTE) At the manufacturer cut-off value of 0.5 g/mL FEU, this assay has a negative predictive value of 95-100%.This assay is intended for use in conjunction with a clinical pretest probability (PTP) assessment model to  exclude pulmonary embolism (PE) and deep venous thrombosis (DVT) in outpatients suspected of PE or DVT. Results should be correlated with clinical presentation. Performed at [facility], [address], [place], [place] [address]   Troponin I (High Sensitivity)     Status: None   Collection Time: 08/11/23  2:26 AM  Result Value Ref Range   Troponin I (High Sensitivity) 7 <18 ng/L    Comment: (NOTE)  Elevated high sensitivity troponin I (hsTnI) values and significant  changes across serial measurements may suggest ACS but many other  chronic and acute conditions are known to elevate hsTnI results.  Refer to the "Links" section for chest pain algorithms and additional  guidance. Performed at [facility], [address], [place], [place] [address]    CT Angio Chest PE W and/or Wo Contrast  Result Date: 08/11/2023 CLINICAL DATA:  Hemoptysis, pulmonary embolism EXAM: CT ANGIOGRAPHY CHEST WITH CONTRAST TECHNIQUE: Multidetector CT imaging of the chest was performed using the standard protocol during bolus administration of intravenous contrast. Multiplanar CT image reconstructions and MIPs were obtained to evaluate the vascular anatomy. RADIATION DOSE REDUCTION: This exam was performed according to the departmental dose-optimization program which includes automated exposure control, adjustment of the mA and/or kV according to patient size and/or use of iterative reconstruction technique. CONTRAST:  80mL OMNIPAQUE IOHEXOL 350 MG/ML SOLN COMPARISON:  12/08/2017 FINDINGS: Cardiovascular: There is adequate opacification of the pulmonary arterial tree. No intraluminal filling defect identified to suggest acute pulmonary embolism. The central pulmonary arteries are of normal caliber. No significant coronary artery calcification. Global cardiac size is within normal limits. No pericardial effusion. Thoracic aorta is unremarkable. Mediastinum/Nodes: No enlarged  mediastinal, hilar, or axillary lymph nodes. Thyroid gland, trachea, and esophagus demonstrate no significant findings. Lungs/Pleura: Mild emphysema. 4 mm noncalcified pulmonary nodule within the left apex is stable since remote prior examination and safely considered benign. A 12 mm endobronchial mass is seen within the medial segmental bronchus of the right middle lobe which is bronchiectatic distally with chronic collapse and volume loss. This may represent a primary neoplasm, such as an endobronchial carcinoid or a an aspirated foreign body. There is superimposed bronchial wall thickening seen diffusely in keeping with airway inflammation. No pneumothorax or pleural effusion. Upper Abdomen: Status post cholecystectomy.  No acute abnormality. Musculoskeletal: No acute bone abnormality. No lytic or blastic bone. Review of the MIP images confirms the above findings. IMPRESSION: 1. No pulmonary embolism. 2. 12 mm endobronchial mass within the medial segmental bronchus of the right middle lobe which is bronchiectatic distally with chronic collapse and volume loss. This may represent a primary neoplasm, such as an endobronchial carcinoid, or an aspirated foreign body. Bronchoscopic correlation is recommended. 3. Diffuse bronchial wall thickening in keeping with airway inflammation. Emphysema (ICD10-J43.9). Electronically Signed   By: Helyn Numbers M.D.   On: 08/11/2023 01:19   CT Soft Tissue Neck W Contrast  Result Date: 08/11/2023 CLINICAL DATA:  Hemoptysis EXAM: CT NECK WITH CONTRAST TECHNIQUE: Multidetector CT imaging of the neck was performed using the standard protocol following the bolus administration of intravenous contrast. RADIATION DOSE REDUCTION: This exam was performed according to the departmental dose-optimization program which includes automated exposure control, adjustment of the mA and/or kV according to patient size and/or use of iterative reconstruction technique. CONTRAST:  80mL OMNIPAQUE  IOHEXOL 350 MG/ML SOLN COMPARISON:  04/03/2017 FINDINGS: PHARYNX AND LARYNX: The nasopharynx, oropharynx and larynx are normal. Visible portions of the oral cavity, tongue base and floor of mouth are normal. Normal epiglottis, vallecula and pyriform sinuses. The larynx is normal. No retropharyngeal abscess, effusion or lymphadenopathy. SALIVARY GLANDS: Normal parotid, submandibular and sublingual glands. THYROID: Normal. LYMPH NODES: No enlarged or abnormal density lymph nodes. VASCULAR: Major cervical vessels are patent. LIMITED INTRACRANIAL: Normal. VISUALIZED ORBITS: Normal. MASTOIDS AND VISUALIZED PARANASAL SINUSES: No fluid levels or advanced mucosal thickening. No mastoid effusion. SKELETON: No bony spinal canal stenosis. No lytic or  blastic lesions. UPPER CHEST: Clear. Unchanged 5 mm subpleural nodule in the left upper lobe, stable since 04/03/2017 and therefore benign. No follow-up imaging required. OTHER: None. IMPRESSION: Normal CT of the neck. Electronically Signed   By: Deatra Robinson M.D.   On: 08/11/2023 01:12   DG Chest 2 View  Result Date: 08/10/2023 CLINICAL DATA:  Hemoptysis, tobacco abuse EXAM: CHEST - 2 VIEW COMPARISON:  09/17/2018 FINDINGS: Frontal and lateral views of the chest demonstrate a stable cardiac silhouette. Chronic elevation of the right hemidiaphragm. No acute airspace disease, effusion, or pneumothorax. No acute bony abnormality. IMPRESSION: 1. No acute intrathoracic process. Electronically Signed   By: Sharlet Salina M.D.   On: 08/10/2023 23:58    Pending Labs Unresulted Labs (From admission, onward)    None       Vitals/Pain Today's Vitals   08/11/23 0115 08/11/23 0225 08/11/23 0242 08/11/23 0245  BP: 115/73 119/74  118/66  Pulse:    64  Resp: 14 18  (!) 21  Temp:   98.8 F (37.1 C)   TempSrc:   Oral   SpO2:    96%  Weight:      Height:      PainSc:        Isolation Precautions No active isolations  Medications Medications  ondansetron (ZOFRAN)  injection 4 mg (4 mg Intravenous Given 08/11/23 0016)  iohexol (OMNIPAQUE) 350 MG/ML injection 80 mL (80 mLs Intravenous Contrast Given 08/11/23 0020)    Mobility walks       R Recommendations: See Admitting Provider Note  Report given to:   Additional Notes:

## 2023-08-11 NOTE — ED Notes (Signed)
Patient transported to CT 

## 2023-08-11 NOTE — H&P (Signed)
History and Physical    Jessica Vega:[id number] DOB: June 18, 1961 DOA: 08/10/2023  PCP: [facility]  Patient coming from: Home  Chief Complaint: Coughing up blood  HPI: Jessica Vega is a 62 y.o. female with medical history significant of AML in remission, bronchiectasis, history of right middle lobe fungal mycetoma and aspergillosis infection, history of actinomyces bacteremia, history of LUE DVT in 2018 on Xarelto, hypertension, hyperlipidemia, type 2 diabetes presented to ED with a chief complaint of hemoptysis.  Vital signs stable.  Labs showing no leukocytosis, hemoglobin 15.2 (stable), troponin negative x 2, D-dimer normal.  CTA chest negative for PE but showing a 12 mm endobronchial mass within the medial segmental bronchus of the right middle lobe concerning for primary neoplasm such as endobronchial carcinoid versus possible aspirated foreign body.  CT soft tissue neck negative for acute abnormality. EDP consulted pulmonology (Dr. Lonzo Candy), recommended holding Xarelto for bronchoscopy in the morning.  Patient was given Zofran.  [facility] called to admit.  Patient is reporting 2-day history of hemoptysis.  Coughing up small amounts of blood.  Reports chronic shortness of breath, no recent change.  Denies chest pain.  Denies fevers or chills.  She has been smoking 1 pack of cigarettes daily since the age of 45.  Last dose of Xarelto was yesterday evening.  Review of Systems:  Review of Systems  All other systems reviewed and are negative.   Past Medical History:  Diagnosis Date   AML (acute myeloblastic leukemia) (HCC)    s/p bone marrow transplant in 2016 complicated by CMV, colitis, and sepsis   Anemia    Anxiety    Depression    Diabetes mellitus without complication (HCC)    Fibromyalgia    GERD (gastroesophageal reflux disease)    H/O blood clots    in left neck per pt   Headache    History of kidney stones    Hypertension    Pneumonia     Past Surgical  History:  Procedure Laterality Date   ABDOMINAL HYSTERECTOMY     CESAREAN SECTION     CHOLECYSTECTOMY     CYSTOSCOPY WITH RETROGRADE PYELOGRAM, URETEROSCOPY AND STENT PLACEMENT Left 03/11/2022   Procedure: CYSTOSCOPY WITH LEFT RETROGRADE PYELOGRAM, URETEROSCOPY AND STENT PLACEMENT;  Surgeon: Bjorn Pippin, MD;  Location: [facility];  Service: Urology;  Laterality: Left;   ectoopic pregnancy      LITHOTRIPSY       reports that she has been smoking cigarettes. She has never used smokeless tobacco. She reports current alcohol use of about 1.0 standard drink of alcohol per week. She reports that she does not use drugs.  Allergies  Allergen Reactions   Chlorhexidine Rash   Other Itching    Transparent dressing causes itching per pt   Venlafaxine Hcl     Makes pt drowsy   Latex Rash    Family History  Problem Relation Age of Onset   CVA Mother    Diabetes Mellitus II Father    Renal Disease Father     Prior to Admission medications   Medication Sig Start Date End Date Taking? Authorizing Provider  acetaminophen (TYLENOL) 500 MG tablet Take 500 mg by mouth every 6 (six) hours as needed.   Yes [provider]  amLODipine (NORVASC) 5 MG tablet Take 5 mg by mouth daily.   Yes [provider]  atorvastatin (LIPITOR) 40 MG tablet Take 40 mg by mouth daily.   Yes [provider]  CALCIUM CITRATE  PO Take 1 tablet by mouth daily.   Yes [provider]  cholecalciferol (VITAMIN D) 1000 UNITS tablet Take 1,000 Units by mouth daily after breakfast.    Yes [provider]  fluticasone (FLONASE) 50 MCG/ACT nasal spray Place 1 spray into both nostrils daily as needed for allergies.   Yes [provider]  ketotifen (ZADITOR) 0.025 % ophthalmic solution Place 1 drop into both eyes 2 (two) times daily as needed (allergies).   Yes [provider]  Magnesium Oxide 500 MG TABS Take 500 mg by mouth daily.   Yes [provider]  metFORMIN  (GLUCOPHAGE) 500 MG tablet Take 500 mg by mouth 2 (two) times daily with a meal.   Yes [provider]  Nutritional Supplements (ESTROVEN WEIGHT MANAGEMENT) CAPS Take 1 capsule by mouth daily.   Yes [provider]  omeprazole (PRILOSEC) 20 MG capsule Take 20 mg by mouth daily.   Yes [provider]  Potassium Chloride ER 20 MEQ TBCR Take 20 mEq by mouth 2 (two) times daily.   Yes [provider]  rivaroxaban (XARELTO) 20 MG TABS tablet Take 20 mg by mouth daily with supper.   Yes [provider]  Albuterol Sulfate (PROAIR RESPICLICK) 108 (90 Base) MCG/ACT AEPB Inhale 2 puffs into the lungs every 4 (four) hours as needed. Patient not taking: Reported on 08/11/2023 09/27/16   Elvina Sidle, MD  carboxymethylcellulose (REFRESH PLUS) 0.5 % SOLN Place 1 drop into both eyes 3 (three) times daily as needed (dry eyes).    [provider]  HYDROcodone-acetaminophen (NORCO) 5-325 MG tablet Take 1 tablet by mouth every 4 (four) hours as needed for severe pain. Patient not taking: Reported on 08/11/2023 03/11/22   Bjorn Pippin, MD    Physical Exam: Vitals:   08/11/23 0242 08/11/23 0245 08/11/23 0345 08/11/23 0419  BP:  118/66 128/72 (!) 135/94  Pulse:  64  (!) 59  Resp:  (!) 21 19 18   Temp: 98.8 F (37.1 C)   97.7 F (36.5 C)  TempSrc: Oral   Oral  SpO2:  96%  99%  Weight:      Height:        Physical Exam Vitals reviewed.  Constitutional:      General: She is not in acute distress. HENT:     Head: Normocephalic and atraumatic.  Eyes:     Extraocular Movements: Extraocular movements intact.  Cardiovascular:     Rate and Rhythm: Normal rate and regular rhythm.     Pulses: Normal pulses.  Pulmonary:     Effort: Pulmonary effort is normal. No respiratory distress.     Breath sounds: Normal breath sounds. No wheezing or rales.  Abdominal:     General: Bowel sounds are normal. There is no distension.     Palpations: Abdomen is soft.      Tenderness: There is no abdominal tenderness. There is no guarding.  Musculoskeletal:     Cervical back: Normal range of motion.     Right lower leg: No edema.     Left lower leg: No edema.  Skin:    General: Skin is warm and dry.  Neurological:     General: No focal deficit present.     Mental Status: She is alert and oriented to person, place, and time.     Labs on Admission: I have personally reviewed following labs and imaging studies  CBC: Recent Labs  Lab 08/10/23 2300  WBC 9.4  NEUTROABS 4.6  HGB 15.2*  HCT 44.4  MCV 94.1  PLT 264   Basic Metabolic Panel: Recent Labs  Lab 08/10/23 2300  NA 136  K 3.9  CL 104  CO2 24  GLUCOSE 150*  BUN 15  CREATININE 0.89  CALCIUM 10.4*   GFR: Estimated Creatinine Clearance: 59.1 mL/min (by C-G formula based on SCr of 0.89 mg/dL). Liver Function Tests: No results for input(s): "AST", "ALT", "ALKPHOS", "BILITOT", "PROT", "ALBUMIN" in the last 168 hours. No results for input(s): "LIPASE", "AMYLASE" in the last 168 hours. No results for input(s): "AMMONIA" in the last 168 hours. Coagulation Profile: No results for input(s): "INR", "PROTIME" in the last 168 hours. Cardiac Enzymes: No results for input(s): "CKTOTAL", "CKMB", "CKMBINDEX", "TROPONINI" in the last 168 hours. BNP (last 3 results) No results for input(s): "PROBNP" in the last 8760 hours. HbA1C: No results for input(s): "HGBA1C" in the last 72 hours. CBG: No results for input(s): "GLUCAP" in the last 168 hours. Lipid Profile: No results for input(s): "CHOL", "HDL", "LDLCALC", "TRIG", "CHOLHDL", "LDLDIRECT" in the last 72 hours. Thyroid Function Tests: No results for input(s): "TSH", "T4TOTAL", "FREET4", "T3FREE", "THYROIDAB" in the last 72 hours. Anemia Panel: No results for input(s): "VITAMINB12", "FOLATE", "FERRITIN", "TIBC", "IRON", "RETICCTPCT" in the last 72 hours. Urine analysis:    Component Value Date/Time   COLORURINE YELLOW 03/24/2009 0207    APPEARANCEUR TURBID (A) 03/24/2009 0207   LABSPEC 1.013 03/24/2009 0207   PHURINE 8.0 03/24/2009 0207   GLUCOSEU NEGATIVE 03/24/2009 0207   HGBUR TRACE (A) 03/24/2009 0207   BILIRUBINUR NEGATIVE 03/24/2009 0207   KETONESUR NEGATIVE 03/24/2009 0207   PROTEINUR NEGATIVE 03/24/2009 0207   UROBILINOGEN 0.2 03/24/2009 0207   NITRITE NEGATIVE 03/24/2009 0207   LEUKOCYTESUR NEGATIVE 03/24/2009 0207    Radiological Exams on Admission: CT Angio Chest PE W and/or Wo Contrast  Result Date: 08/11/2023 CLINICAL DATA:  Hemoptysis, pulmonary embolism EXAM: CT ANGIOGRAPHY CHEST WITH CONTRAST TECHNIQUE: Multidetector CT imaging of the chest was performed using the standard protocol during bolus administration of intravenous contrast. Multiplanar CT image reconstructions and MIPs were obtained to evaluate the vascular anatomy. RADIATION DOSE REDUCTION: This exam was performed according to the departmental dose-optimization program which includes automated exposure control, adjustment of the mA and/or kV according to patient size and/or use of iterative reconstruction technique. CONTRAST:  80mL OMNIPAQUE IOHEXOL 350 MG/ML SOLN COMPARISON:  12/08/2017 FINDINGS: Cardiovascular: There is adequate opacification of the pulmonary arterial tree. No intraluminal filling defect identified to suggest acute pulmonary embolism. The central pulmonary arteries are of normal caliber. No significant coronary artery calcification. Global cardiac size is within normal limits. No pericardial effusion. Thoracic aorta is unremarkable. Mediastinum/Nodes: No enlarged mediastinal, hilar, or axillary lymph nodes. Thyroid gland, trachea, and esophagus demonstrate no significant findings. Lungs/Pleura: Mild emphysema. 4 mm noncalcified pulmonary nodule within the left apex is stable since remote prior examination and safely considered benign. A 12 mm endobronchial mass is seen within the medial segmental bronchus of the right middle lobe which  is bronchiectatic distally with chronic collapse and volume loss. This may represent a primary neoplasm, such as an endobronchial carcinoid or a an aspirated foreign body. There is superimposed bronchial wall thickening seen diffusely in keeping with airway inflammation. No pneumothorax or pleural effusion. Upper Abdomen: Status post cholecystectomy.  No acute abnormality. Musculoskeletal: No acute bone abnormality. No lytic or blastic bone. Review of the MIP images confirms the above findings. IMPRESSION: 1. No pulmonary embolism. 2. 12 mm endobronchial mass within the medial  segmental bronchus of the right middle lobe which is bronchiectatic distally with chronic collapse and volume loss. This may represent a primary neoplasm, such as an endobronchial carcinoid, or an aspirated foreign body. Bronchoscopic correlation is recommended. 3. Diffuse bronchial wall thickening in keeping with airway inflammation. Emphysema (ICD10-J43.9). Electronically Signed   By: Helyn Numbers M.D.   On: 08/11/2023 01:19   CT Soft Tissue Neck W Contrast  Result Date: 08/11/2023 CLINICAL DATA:  Hemoptysis EXAM: CT NECK WITH CONTRAST TECHNIQUE: Multidetector CT imaging of the neck was performed using the standard protocol following the bolus administration of intravenous contrast. RADIATION DOSE REDUCTION: This exam was performed according to the departmental dose-optimization program which includes automated exposure control, adjustment of the mA and/or kV according to patient size and/or use of iterative reconstruction technique. CONTRAST:  80mL OMNIPAQUE IOHEXOL 350 MG/ML SOLN COMPARISON:  04/03/2017 FINDINGS: PHARYNX AND LARYNX: The nasopharynx, oropharynx and larynx are normal. Visible portions of the oral cavity, tongue base and floor of mouth are normal. Normal epiglottis, vallecula and pyriform sinuses. The larynx is normal. No retropharyngeal abscess, effusion or lymphadenopathy. SALIVARY GLANDS: Normal parotid,  submandibular and sublingual glands. THYROID: Normal. LYMPH NODES: No enlarged or abnormal density lymph nodes. VASCULAR: Major cervical vessels are patent. LIMITED INTRACRANIAL: Normal. VISUALIZED ORBITS: Normal. MASTOIDS AND VISUALIZED PARANASAL SINUSES: No fluid levels or advanced mucosal thickening. No mastoid effusion. SKELETON: No bony spinal canal stenosis. No lytic or blastic lesions. UPPER CHEST: Clear. Unchanged 5 mm subpleural nodule in the left upper lobe, stable since 04/03/2017 and therefore benign. No follow-up imaging required. OTHER: None. IMPRESSION: Normal CT of the neck. Electronically Signed   By: Deatra Robinson M.D.   On: 08/11/2023 01:12   DG Chest 2 View  Result Date: 08/10/2023 CLINICAL DATA:  Hemoptysis, tobacco abuse EXAM: CHEST - 2 VIEW COMPARISON:  09/17/2018 FINDINGS: Frontal and lateral views of the chest demonstrate a stable cardiac silhouette. Chronic elevation of the right hemidiaphragm. No acute airspace disease, effusion, or pneumothorax. No acute bony abnormality. IMPRESSION: 1. No acute intrathoracic process. Electronically Signed   By: Sharlet Salina M.D.   On: 08/10/2023 23:58    EKG: Independently reviewed.  Sinus rhythm, no significant change since previous tracing.  Assessment and Plan  Hemoptysis Endobronchial mass In the setting of chronic anticoagulation use. CTA chest negative for PE but showing a 12 mm endobronchial mass within the medial segmental bronchus of the right middle lobe concerning for primary neoplasm such as endobronchial carcinoid versus possible aspirated foreign body.  Hemoglobin stable.  No hypoxia or respiratory distress.  Pulmonology consulted by ED physician and recommended holding Xarelto for bronchoscopy in the morning.  History of LUE DVT in 2018 Hold Xarelto in the setting of hemoptysis.  Hypertension Blood pressure stable.  Continue amlodipine.  Hyperlipidemia Continue Lipitor.  Type 2 diabetes Last A1c 6.4 in June 2023,  repeat ordered.  Sensitive sliding scale insulin.  GERD Continue PPI.  Cigarette smoking Patient has been counseled to quit and NicoDerm patch ordered.  DVT prophylaxis: SCDs Code Status: Full Code (discussed with the patient) Family Communication: Son at bedside. Level of care: Progressive Care Unit Admission status: It is my clinical opinion that referral for OBSERVATION is reasonable and necessary in this patient based on the above information provided. The aforementioned taken together are felt to place the patient at high risk for further clinical deterioration. However, it is anticipated that the patient may be medically stable for discharge from the hospital within 24 to  48 hours.  John Giovanni MD [facility]  If 7PM-7AM, please contact night-coverage [email or web address]  08/11/2023, 4:19 AM
